# Patient Record
Sex: Male | Born: 2000 | Race: White | Hispanic: No | Marital: Single | Smoking: Never smoker
Health system: Southern US, Community
[De-identification: ages and names within clinical notes are randomized; demographics above are authoritative.]

## PROBLEM LIST (undated history)

## (undated) DIAGNOSIS — T7840XA Allergy, unspecified, initial encounter: Secondary | ICD-10-CM

## (undated) DIAGNOSIS — Z9889 Other specified postprocedural states: Secondary | ICD-10-CM

## (undated) DIAGNOSIS — J45909 Unspecified asthma, uncomplicated: Secondary | ICD-10-CM

## (undated) DIAGNOSIS — R112 Nausea with vomiting, unspecified: Secondary | ICD-10-CM

## (undated) HISTORY — DX: Unspecified asthma, uncomplicated: J45.909

## (undated) HISTORY — DX: Allergy, unspecified, initial encounter: T78.40XA

---
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
## 2021-02-08 ENCOUNTER — Encounter: Payer: Self-pay | Admitting: Family Medicine

## 2021-02-08 ENCOUNTER — Other Ambulatory Visit: Payer: Self-pay

## 2021-02-08 ENCOUNTER — Ambulatory Visit (INDEPENDENT_AMBULATORY_CARE_PROVIDER_SITE_OTHER): Payer: No Typology Code available for payment source | Admitting: Family Medicine

## 2021-02-08 VITALS — BP 134/80 | HR 102 | Temp 98.1°F | Ht 71.0 in | Wt 281.2 lb

## 2021-02-08 DIAGNOSIS — Z87438 Personal history of other diseases of male genital organs: Secondary | ICD-10-CM | POA: Diagnosis not present

## 2021-02-08 DIAGNOSIS — Z113 Encounter for screening for infections with a predominantly sexual mode of transmission: Secondary | ICD-10-CM

## 2021-02-08 NOTE — Progress Notes (Signed)
[facility] [address] [place] [address] Dept: [phone] Dept Fax: [phone]  Office Visit  Subjective:    Patient ID: Dustin Parks, male    DOB: 10/01/2001, 20 y.o..   MRN: [id number]  Chief Complaint  Patient presents with  . Acute Visit    Wants STD screening, wants sperm count, and to discuss feet care.      History of Present Illness:  Patient is in today requesting screening for STDs. He notes that he recently learned that a former sexual partner had not been faithful to him. Dustin Parks has not had any signs of a current STD, denying penile discharge, sores, or swollen glands. He does admit to unprotected intercourse wit this previous partner.  Dustin Parks notes that with his current partner, they are discussing having a baby. He has a concern about his own sperm count. He notes his father has a history of a low sperm count. 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 He notes he ws supposed to have seen a urologist at the time, but this never happened. He has no current children.  Dustin Parks also had some questions in regards to prevention of foot problems. He is working in [employer], which involves being on his feet much of the time. He currently has Birkenstock shoes. He finds the shoes to be too firm and pressing more than usual on the arch. He wonders about how to prevent later foot problems.  Past Medical History: Patient Active Problem List   Diagnosis Date Noted  . History of orchitis 02/08/2021   No past surgical history on file.  Family History  Problem Relation Age of Onset  . Healthy Mother   . Healthy Father   . Healthy Maternal Grandmother   . Healthy Paternal Grandfather    Outpatient Medications Prior to Visit  Medication Sig Dispense Refill  . Saccharomyces boulardii (PROBIOTIC) 250 MG CAPS Take by  mouth.    . cephALEXin (KEFLEX) 500 MG capsule Take 1 capsule (500 mg total) by mouth 3 (three) times daily. (Patient not taking: No sig reported) 21 capsule 0  . oseltamivir (TAMIFLU) 75 MG capsule Take 1 capsule (75 mg total) by mouth every 12 (twelve) hours. (Patient not taking: No sig reported) 10 capsule 0   No facility-administered medications prior to visit.   No Known Allergies    Objective:   Today's Vitals   02/08/21 1307  BP: 134/80  Pulse: (!) 102  Temp: 98.1 F (36.7 C)  TempSrc: Temporal  SpO2: 98%  Weight: 281 lb 3.2 oz (127.6 kg)  Height: 5\' 11"  (1.803 m)   Body mass index is 39.22 kg/m.   General: Well developed, well nourished. No acute distress. Psych: Alert and oriented. Normal mood and affect.  Health Maintenance Due  Topic Date Due  . Hepatitis C Screening  Never done  . COVID-19 Vaccine (1) Never done  . HPV VACCINES (1 - Male 2-dose series) Never done     Assessment & Plan:   1. Encounter for screening examination for sexually transmitted disease We will perform screening tests for STDs today. As he is in a more stable relationship and looking at having a child, I did not discuss further about condom use.  - Chlamydia/GC NAA, Confirmation - HCV Ab w Reflex to Quant PCR - Hepatitis B surface antigen - HIV Antibody (routine testing w rflx) - RPR  2. History of orchitis Although we would not typically perform a semen analysis in someone without a history of infertility, the remote history of orchitis raises the potential for this being an issue. Our clinic is not able to run a sperm analysis locally. I will refer him to urology for an assessment related to hsi past orchitis and possibly completed the semen analysis.  We did discuss potentially using off-the-chelf shoe inserts to see if this improves comfort. - Ambulatory referral to Urology  Loyola Mast, MD

## 2021-02-09 LAB — RPR: RPR Ser Ql: NONREACTIVE

## 2021-02-09 LAB — HCV INTERPRETATION

## 2021-02-09 LAB — HEPATITIS B SURFACE ANTIGEN: Hepatitis B Surface Ag: NONREACTIVE

## 2021-02-09 LAB — HCV AB W REFLEX TO QUANT PCR: HCV Ab: <0.1 s/co ratio (ref 0.0–0.9)

## 2021-02-09 LAB — HIV ANTIBODY (ROUTINE TESTING W REFLEX): HIV 1&2 Ab, 4th Generation: NONREACTIVE

## 2021-02-10 LAB — CHLAMYDIA/GC NAA, CONFIRMATION
Chlamydia trachomatis, NAA: NEGATIVE
Neisseria gonorrhoeae, NAA: NEGATIVE

## 2021-03-30 ENCOUNTER — Other Ambulatory Visit (HOSPITAL_COMMUNITY): Payer: Self-pay

## 2021-03-30 MED ORDER — CARESTART COVID-19 HOME TEST VI KIT
PACK | 0 refills | Status: DC
  Filled 2021-03-30: qty 4, 4d supply, fill #0

## 2021-05-26 ENCOUNTER — Other Ambulatory Visit (HOSPITAL_COMMUNITY): Payer: Self-pay

## 2021-05-26 MED ORDER — CARESTART COVID-19 HOME TEST VI KIT
PACK | 0 refills | Status: DC
  Filled 2021-05-26: qty 4, 4d supply, fill #0

## 2021-07-18 ENCOUNTER — Other Ambulatory Visit (HOSPITAL_COMMUNITY): Payer: Self-pay

## 2021-07-18 MED ORDER — CARESTART COVID-19 HOME TEST VI KIT
PACK | 0 refills | Status: DC
  Filled 2021-07-18: qty 4, 4d supply, fill #0

## 2021-08-11 ENCOUNTER — Encounter: Payer: Self-pay | Admitting: Family Medicine

## 2021-08-21 ENCOUNTER — Encounter: Payer: No Typology Code available for payment source | Admitting: Family Medicine

## 2021-09-26 ENCOUNTER — Other Ambulatory Visit: Payer: Self-pay

## 2021-09-26 ENCOUNTER — Encounter: Payer: Self-pay | Admitting: Family Medicine

## 2021-09-26 ENCOUNTER — Ambulatory Visit (INDEPENDENT_AMBULATORY_CARE_PROVIDER_SITE_OTHER): Payer: No Typology Code available for payment source

## 2021-09-26 ENCOUNTER — Ambulatory Visit (INDEPENDENT_AMBULATORY_CARE_PROVIDER_SITE_OTHER): Payer: No Typology Code available for payment source | Admitting: Family Medicine

## 2021-09-26 VITALS — BP 138/82 | HR 105 | Temp 97.8°F | Ht 71.0 in | Wt 304.0 lb

## 2021-09-26 DIAGNOSIS — F4320 Adjustment disorder, unspecified: Secondary | ICD-10-CM | POA: Diagnosis not present

## 2021-09-26 DIAGNOSIS — M542 Cervicalgia: Secondary | ICD-10-CM

## 2021-09-26 DIAGNOSIS — Z3009 Encounter for other general counseling and advice on contraception: Secondary | ICD-10-CM | POA: Diagnosis not present

## 2021-09-26 DIAGNOSIS — Z23 Encounter for immunization: Secondary | ICD-10-CM

## 2021-09-26 IMAGING — DX DG CERVICAL SPINE COMPLETE 4+V
5 series · 5 of 5 positions shown · non-contrast
Comparison: None.

CLINICAL DATA: Pain.  Ongoing neck pain.

EXAM:
CERVICAL SPINE - COMPLETE 4+ VIEW

[cervical spine ap]
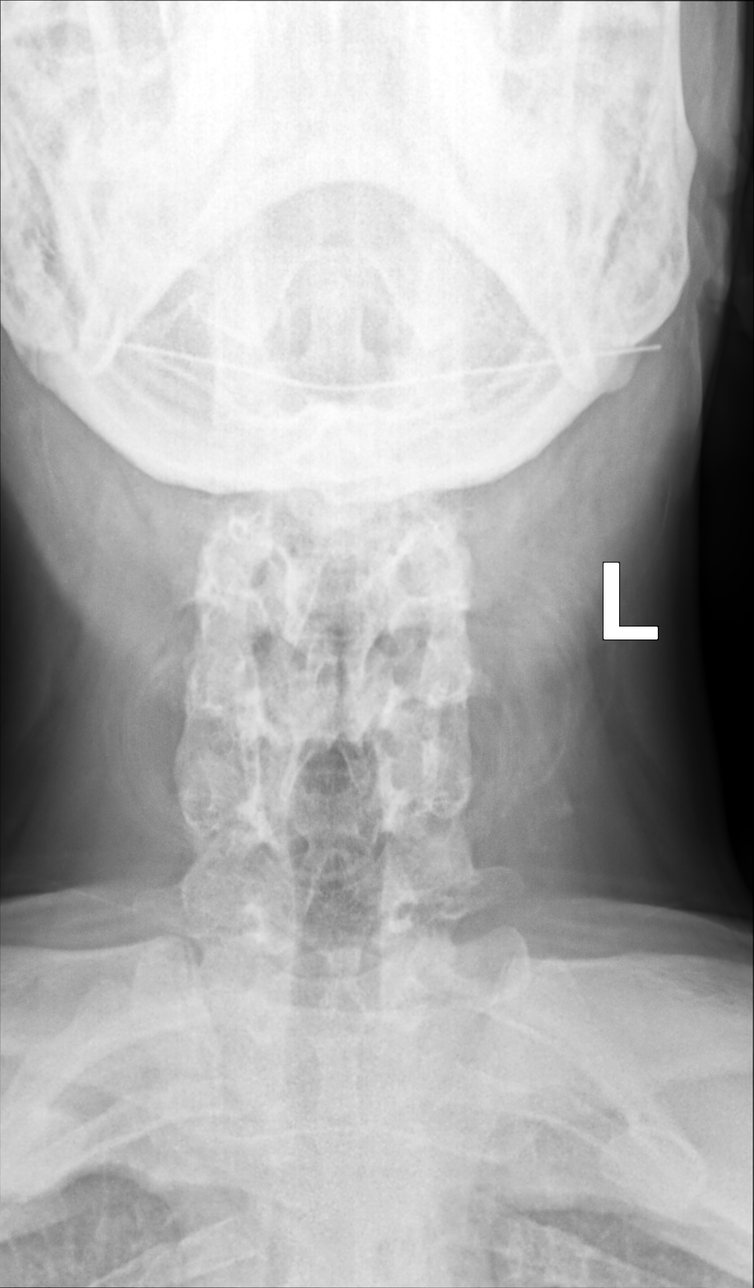

[cervical spine oblique (1 of 2)]
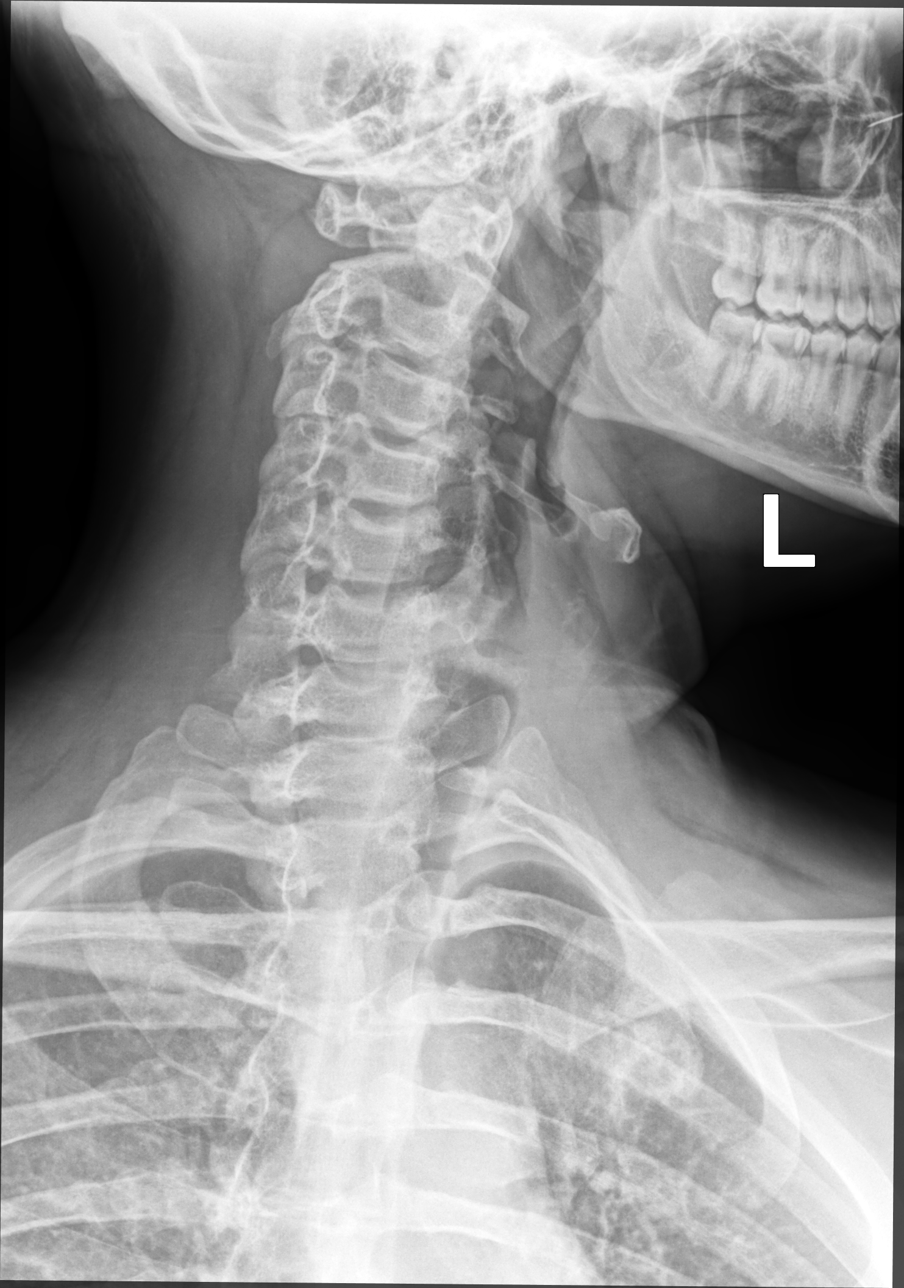

[cervical spine oblique (2 of 2)]
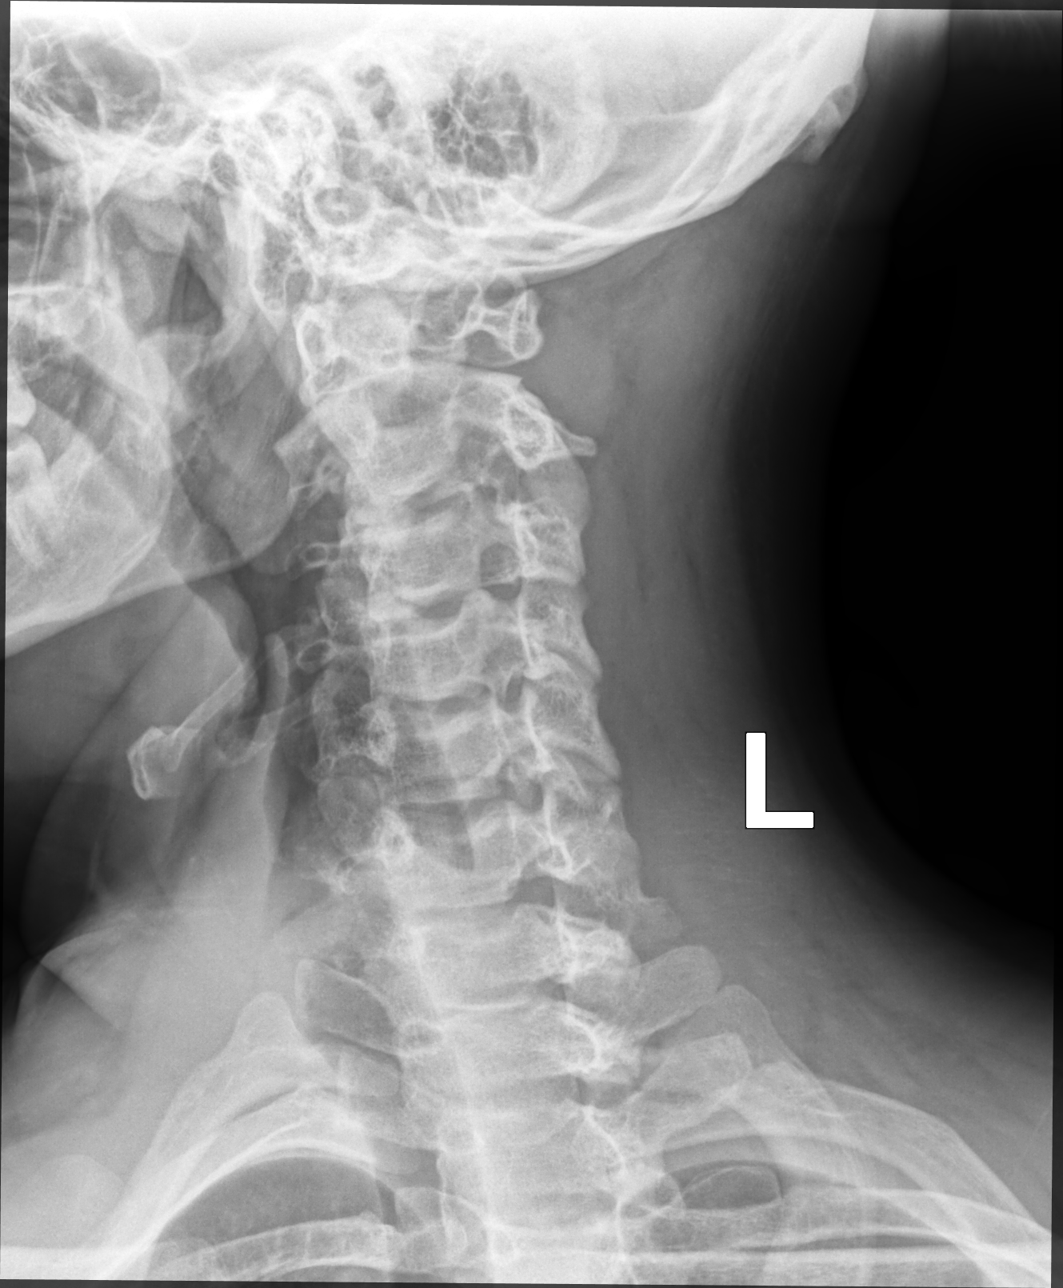

[cervical spine lat]
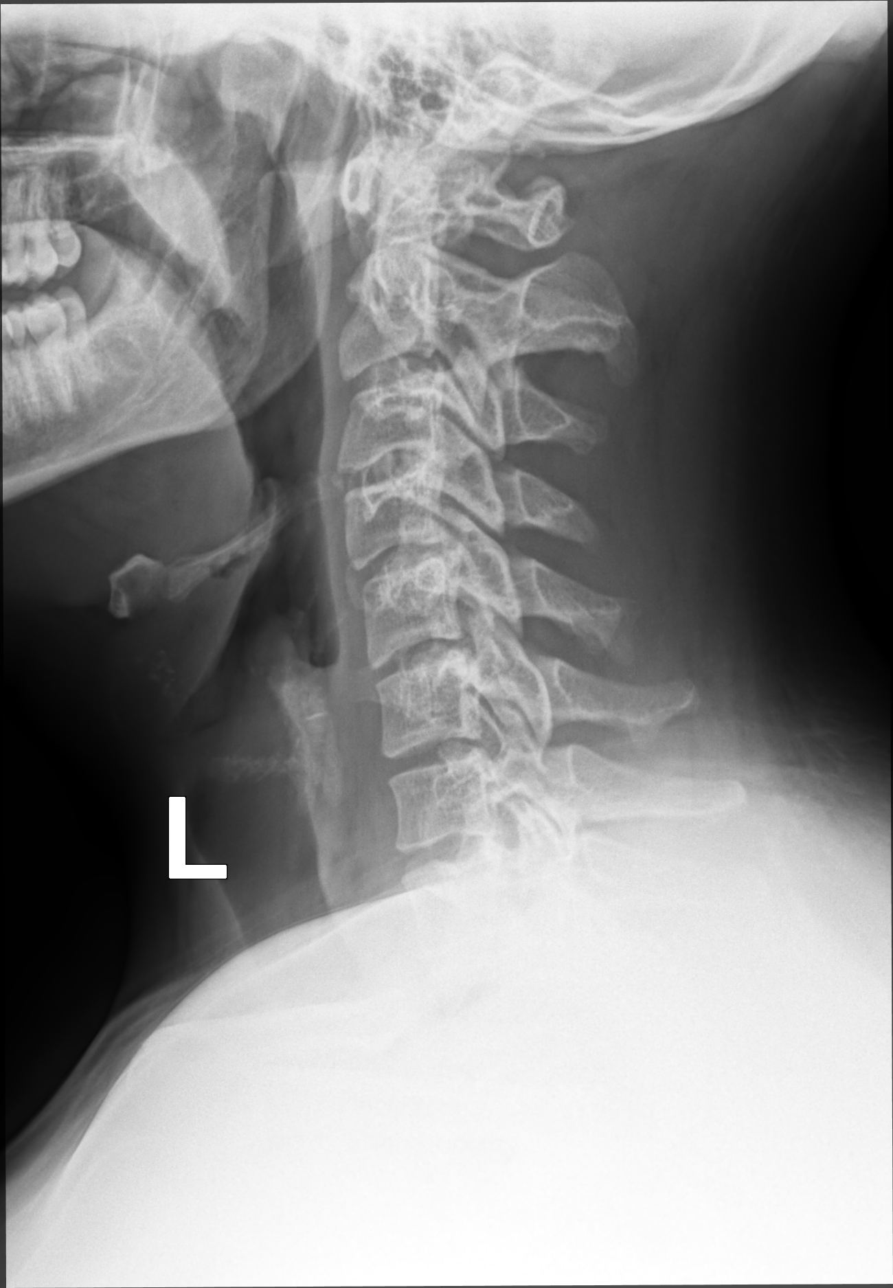

[cervical spine open mouth ap]
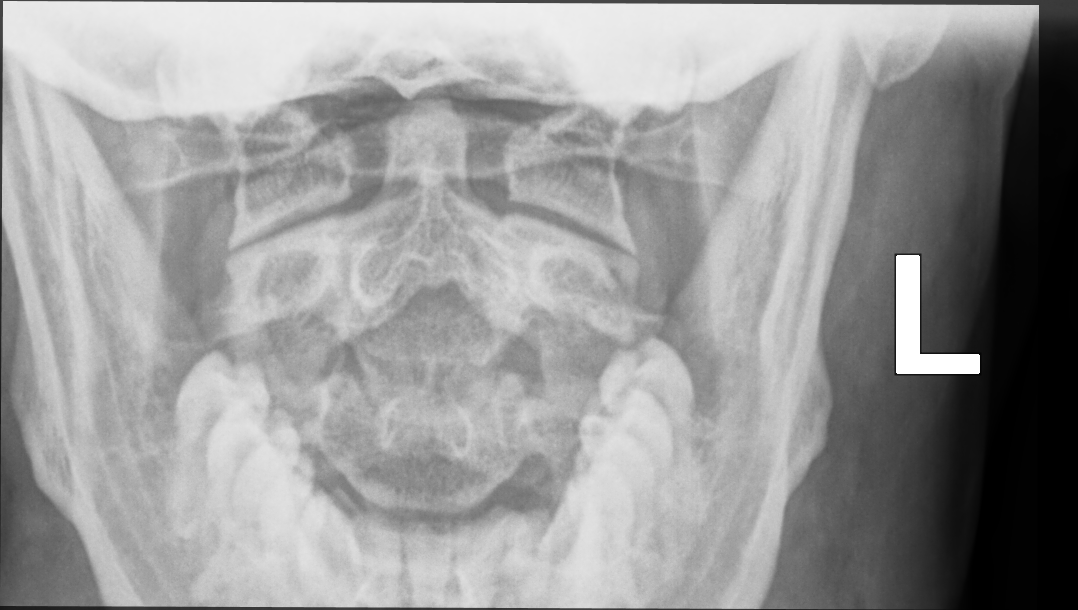

[5 of 5 positions shown; findings below may reference images not displayed]

FINDINGS: Cervical spine alignment is maintained. Vertebral body heights and
intervertebral disc spaces are preserved. The dens is intact.
Posterior elements appear well-aligned. There is no evidence of
fracture or focal bone abnormality. Positioning limits assessment of
neural foramina. No prevertebral soft tissue edema.
IMPRESSION: Negative radiographs of the cervical spine.

## 2021-09-26 NOTE — Progress Notes (Signed)
Established Patient Office Visit  Subjective:  Patient ID: Dustin Parks, male    DOB: 21-Apr-2001  Age: 20 y.o. MRN: [id number]  CC:  Chief Complaint  Patient presents with   Annual Exam    CPE, concerns about back pains. Would like referral to chiropractor, vasectomy, nose bleeds more frequent.       HPI Dustin Parks presents for evaluation for neck and back pain.  Neck seems to be bothering him the most.  Pain in his neck and lower back are nonradiating.  It is intermittent.  Denies injury history.  Denies muscle spasms.  He works as a Biomedical scientist.  He is tall and he notes that his workstation is at an uncomfortable height for him.  He wonders if he needs a new pillow.  He bought an expensive pillow and it does not seem to be helping.  He would like to see a chiropractor.  He requests again referral for vasectomy.  Past Medical History:  Diagnosis Date   Asthma    out grown now     No past surgical history on file.  Family History  Problem Relation Age of Onset   Healthy Mother    Healthy Father    Healthy Maternal Grandmother    Healthy Paternal Grandfather     Social History   Socioeconomic History   Marital status: Single    Spouse name: Not on file   Number of children: Not on file   Years of education: Not on file   Highest education level: Not on file  Occupational History   Not on file  Tobacco Use   Smoking status: Never   Smokeless tobacco: Never  Vaping Use   Vaping Use: Never used  Substance and Sexual Activity   Alcohol use: Not Currently    Comment: rare   Drug use: Yes    Types: Marijuana    Comment: social   Sexual activity: Yes  Other Topics Concern   Not on file  Social History Narrative   Not on file   Social Determinants of Health   Financial Resource Strain: Not on file  Food Insecurity: Not on file  Transportation Needs: Not on file  Physical Activity: Not on file  Stress: Not on file  Social Connections: Not on file   Intimate Partner Violence: Not on file    Outpatient Medications Prior to Visit  Medication Sig Dispense Refill   COVID-19 At Home Antigen Test ([facility] COVID-19 HOME TEST) KIT Use as directed 4 each 0   Saccharomyces boulardii (PROBIOTIC) 250 MG CAPS Take by mouth.     No facility-administered medications prior to visit.    No Known Allergies  ROS Review of Systems  Constitutional:  Negative for chills, diaphoresis, fatigue, fever and unexpected weight change.  HENT: Negative.    Eyes:  Negative for photophobia and visual disturbance.  Respiratory: Negative.    Cardiovascular: Negative.   Gastrointestinal: Negative.   Genitourinary: Negative.   Musculoskeletal:  Positive for back pain, neck pain and neck stiffness.  Neurological:  Negative for weakness and numbness.  Depression screen [facility] 2/9 09/26/2021 08/18/2020 08/18/2020  Decreased Interest 0 0 0  Down, Depressed, Hopeless 0 1 0  PHQ - 2 Score 0 1 0  Altered sleeping - 1 -  Tired, decreased energy - 2 -  Change in appetite - 0 -  Feeling bad or failure about yourself  - 1 -  Trouble concentrating - 1 -  Moving slowly  or fidgety/restless - 0 -  Suicidal thoughts - 0 -  PHQ-9 Score - 6 -  Difficult doing work/chores - Somewhat difficult -       Objective:    Physical Exam Vitals and nursing note reviewed.  Constitutional:      Appearance: Normal appearance.  HENT:     Head: Normocephalic and atraumatic.  Eyes:     General:        Right eye: No discharge.        Left eye: No discharge.     Extraocular Movements: Extraocular movements intact.     Conjunctiva/sclera: Conjunctivae normal.  Pulmonary:     Effort: Pulmonary effort is normal.  Musculoskeletal:     Cervical back: No spasms, tenderness or bony tenderness. No pain with movement. Normal range of motion.     Lumbar back: No tenderness or bony tenderness. Normal range of motion.  Skin:    General: Skin is warm and dry.  Neurological:      Mental Status: He is alert and oriented to person, place, and time.     Cranial Nerves: No dysarthria or facial asymmetry.     Motor: No weakness.  Psychiatric:        Mood and Affect: Mood normal.        Behavior: Behavior normal.    BP 138/82 (BP Location: Right Arm, Patient Position: Sitting, Cuff Size: Large)   Pulse (!) 105   Temp 97.8 F (36.6 C) (Temporal)   Ht 5' 11"  (1.803 m)   Wt (!) 304 lb (137.9 kg)   SpO2 98%   BMI 42.40 kg/m  Wt Readings from Last 3 Encounters:  09/26/21 (!) 304 lb (137.9 kg) (>99 %, Z= 3.10)*  02/08/21 281 lb 3.2 oz (127.6 kg) (>99 %, Z= 2.84)*  08/18/20 282 lb 12.8 oz (128.3 kg) (>99 %, Z= 2.87)*   * Growth percentiles are based on CDC (Boys, 2-20 Years) data.     Health Maintenance Due  Topic Date Due   HPV VACCINES (1 - Male 2-dose series) Never done   INFLUENZA VACCINE  06/05/2021       Topic Date Due   HPV VACCINES (1 - Male 2-dose series) Never done    No results found for: TSH Lab Results  Component Value Date   WBC 8.1 08/18/2020   HGB 15.8 08/18/2020   HCT 46.9 08/18/2020   MCV 81.5 08/18/2020   PLT 192.0 08/18/2020   Lab Results  Component Value Date   NA 137 08/18/2020   K 3.9 08/18/2020   CO2 25 08/18/2020   GLUCOSE 105 (H) 08/18/2020   BUN 14 08/18/2020   CREATININE 0.75 08/18/2020   BILITOT 0.7 08/18/2020   ALKPHOS 42 (L) 08/18/2020   AST 28 08/18/2020   ALT 30 08/18/2020   PROT 7.9 08/18/2020   ALBUMIN 4.6 08/18/2020   CALCIUM 10.1 08/18/2020   GFR 133.76 08/18/2020   Lab Results  Component Value Date   CHOL 168 08/18/2020   Lab Results  Component Value Date   HDL 44.30 08/18/2020   No results found for: [facility] Lab Results  Component Value Date   TRIG 242.0 (H) 08/18/2020   Lab Results  Component Value Date   CHOLHDL 4 08/18/2020   No results found for: HGBA1C    Assessment & Plan:   Problem List Items Addressed This Visit       Other   Adjustment disorder   Relevant Orders    Ambulatory referral  to Psychology   Encounter for vasectomy counseling   Relevant Orders   Ambulatory referral to Urology   Neck pain - Primary   Relevant Orders   DG Cervical Spine Complete   Ambulatory referral to Sports Medicine    No orders of the defined types were placed in this encounter.   Follow-up: Return in about 6 months (around 03/26/2022).  Patient was given information on surgical sterilization.  He is aware that it is considered to be a permanent procedure.  He has decided that he wants this done.  I am referring him to urology. I am also referring him to psychology.  We talked about changes in his workspace and proper neck and lower back posture.  Advised him to consider raising his workstation to accommodate his height.  He was given information on neck exercises.  Libby Maw, MD

## 2021-10-04 NOTE — Progress Notes (Signed)
[facility] [address] [place] [address] Phone: [phone] Subjective:   Bruce Donath, am serving as a scribe for Dr. Antoine Primas. This visit occurred during the SARS-CoV-2 public health emergency.  Safety protocols were in place, including screening questions prior to the visit, additional usage of staff PPE, and extensive cleaning of exam room while observing appropriate contact time as indicated for disinfecting solutions.    I'm seeing this patient by the request  of:  Mliss Sax, MD  CC: Neck pain  FTD:[id number]  Dustin Parks is a 20 y.o. male coming in with complaint of neck pain. Patient states that he has had pain for 6 months in neck. Feels like his pillow is the issue. Patient is able to self manipulate neck which helps alleviate his pain. Feels work contributes to issues as he works as Investment banker, operational at [employer]. Not able to raise work station but has been using a wider stance to lower himself to countertop.   Cervical Xray 09/26/2021 IMPRESSION: Negative radiographs of the cervical spine.       Past Medical History:  Diagnosis Date   Asthma    out grown now    No past surgical history on file. Social History   Socioeconomic History   Marital status: Single    Spouse name: Not on file   Number of children: Not on file   Years of education: Not on file   Highest education level: Not on file  Occupational History   Not on file  Tobacco Use   Smoking status: Never   Smokeless tobacco: Never  Vaping Use   Vaping Use: Never used  Substance and Sexual Activity   Alcohol use: Not Currently    Comment: rare   Drug use: Yes    Types: Marijuana    Comment: social   Sexual activity: Yes  Other Topics Concern   Not on file  Social History Narrative   Not on file   Social Determinants of Health   Financial Resource Strain: Not on file  Food Insecurity: Not on file  Transportation Needs:  Not on file  Physical Activity: Not on file  Stress: Not on file  Social Connections: Not on file   No Known Allergies Family History  Problem Relation Age of Onset   Healthy Mother    Healthy Father    Healthy Maternal Grandmother    Healthy Paternal Grandfather    No current outpatient medications on file.   Reviewed prior external information including notes and imaging from  primary care provider As well as notes that were available from care everywhere and other healthcare systems.  Past medical history, social, surgical and family history all reviewed in electronic medical record.  No pertanent information unless stated regarding to the chief complaint.   Review of Systems:  No headache, visual changes, nausea, vomiting, diarrhea, constipation, dizziness, abdominal pain, skin rash, fevers, chills, night sweats, weight loss, swollen lymph nodes, body aches, joint swelling, chest pain, shortness of breath, mood changes. POSITIVE muscle aches  Objective  Blood pressure 112/86, pulse 83, height 5\' 11"  (1.803 m), weight (!) 303 lb (137.4 kg), SpO2 98 %.   General: No apparent distress alert and oriented x3 mood and affect normal, dressed appropriately.  HEENT: Pupils equal, extraocular movements intact  Respiratory: Patient's speak in full sentences and does not appear short of breath  Cardiovascular: No lower extremity edema, non tender, no erythema  Gait normal  with good balance and coordination.  MSK:   Neck exam shows patient does actually have more of a hypermobility noted.  Patient was tender to palpation noted in the paraspinal musculature of the cervical spine on the right greater than left.  Tightness also noted in the right scapular region.  5 out of 5 strength of the upper extremities otherwise.  Osteopathic findings C4 flexed rotated and side bent right T3 extended rotated and side bent right inhaled third rib T9 extended rotated and side bent left      Impression and Recommendations:     The above documentation has been reviewed and is accurate and complete Judi Saa, DO

## 2021-10-05 ENCOUNTER — Ambulatory Visit (INDEPENDENT_AMBULATORY_CARE_PROVIDER_SITE_OTHER): Payer: No Typology Code available for payment source | Admitting: Family Medicine

## 2021-10-05 ENCOUNTER — Encounter: Payer: Self-pay | Admitting: Family Medicine

## 2021-10-05 ENCOUNTER — Other Ambulatory Visit: Payer: Self-pay

## 2021-10-05 VITALS — BP 112/86 | HR 83 | Ht 71.0 in | Wt 303.0 lb

## 2021-10-05 DIAGNOSIS — M9908 Segmental and somatic dysfunction of rib cage: Secondary | ICD-10-CM | POA: Diagnosis not present

## 2021-10-05 DIAGNOSIS — M542 Cervicalgia: Secondary | ICD-10-CM | POA: Diagnosis not present

## 2021-10-05 DIAGNOSIS — M9902 Segmental and somatic dysfunction of thoracic region: Secondary | ICD-10-CM

## 2021-10-05 DIAGNOSIS — M9901 Segmental and somatic dysfunction of cervical region: Secondary | ICD-10-CM

## 2021-10-05 NOTE — Assessment & Plan Note (Signed)
   Decision today to treat with OMT was based on Physical Exam  After verbal consent patient was treated with HVLA, ME, FPR techniques in cervical, thoracic, rib, areas, all areas are chronic   Patient tolerated the procedure well with improvement in symptoms  Patient given exercises, stretches and lifestyle modifications  See medications in patient instructions if given  Patient will follow up in 6-8 weeks 

## 2021-10-05 NOTE — Patient Instructions (Signed)
Vit D 2000IU  COOP pillow See me in 6-8 weeks

## 2021-10-05 NOTE — Assessment & Plan Note (Signed)
Patient does have hypermobility noted.  Patient also has poor posture patient is to work on this and given home exercises.  X-rays of the neck are unremarkable.  Patient given suggestions of different pillows that I think will be beneficial as well.  Optimistic patient will do well with conservative therapy.  Follow-up again in 6 to 8 weeks.  Responded well to manipulation.

## 2021-10-09 ENCOUNTER — Encounter: Payer: Self-pay | Admitting: Family Medicine

## 2021-10-09 DIAGNOSIS — S8990XS Unspecified injury of unspecified lower leg, sequela: Secondary | ICD-10-CM

## 2021-10-10 ENCOUNTER — Other Ambulatory Visit (HOSPITAL_BASED_OUTPATIENT_CLINIC_OR_DEPARTMENT_OTHER): Payer: Self-pay | Admitting: Orthopaedic Surgery

## 2021-10-10 ENCOUNTER — Other Ambulatory Visit: Payer: Self-pay

## 2021-10-10 ENCOUNTER — Ambulatory Visit (INDEPENDENT_AMBULATORY_CARE_PROVIDER_SITE_OTHER): Payer: No Typology Code available for payment source | Admitting: Orthopaedic Surgery

## 2021-10-10 ENCOUNTER — Ambulatory Visit (HOSPITAL_BASED_OUTPATIENT_CLINIC_OR_DEPARTMENT_OTHER)
Admission: RE | Admit: 2021-10-10 | Discharge: 2021-10-10 | Disposition: A | Discharge status: Home / Self Care | Payer: No Typology Code available for payment source | Source: Ambulatory Visit | Attending: Orthopaedic Surgery | Admitting: Orthopaedic Surgery

## 2021-10-10 DIAGNOSIS — M25572 Pain in left ankle and joints of left foot: Secondary | ICD-10-CM | POA: Insufficient documentation

## 2021-10-10 DIAGNOSIS — M25562 Pain in left knee: Secondary | ICD-10-CM | POA: Insufficient documentation

## 2021-10-10 DIAGNOSIS — M2392 Unspecified internal derangement of left knee: Secondary | ICD-10-CM | POA: Diagnosis not present

## 2021-10-10 IMAGING — DX DG KNEE COMPLETE 4+V*L*
4 series · 4 of 4 positions shown · non-contrast
Comparison: No prior.

CLINICAL DATA: Fall.  Left leg pain.

EXAM:
LEFT KNEE - COMPLETE 4+ VIEW

[knee ap]
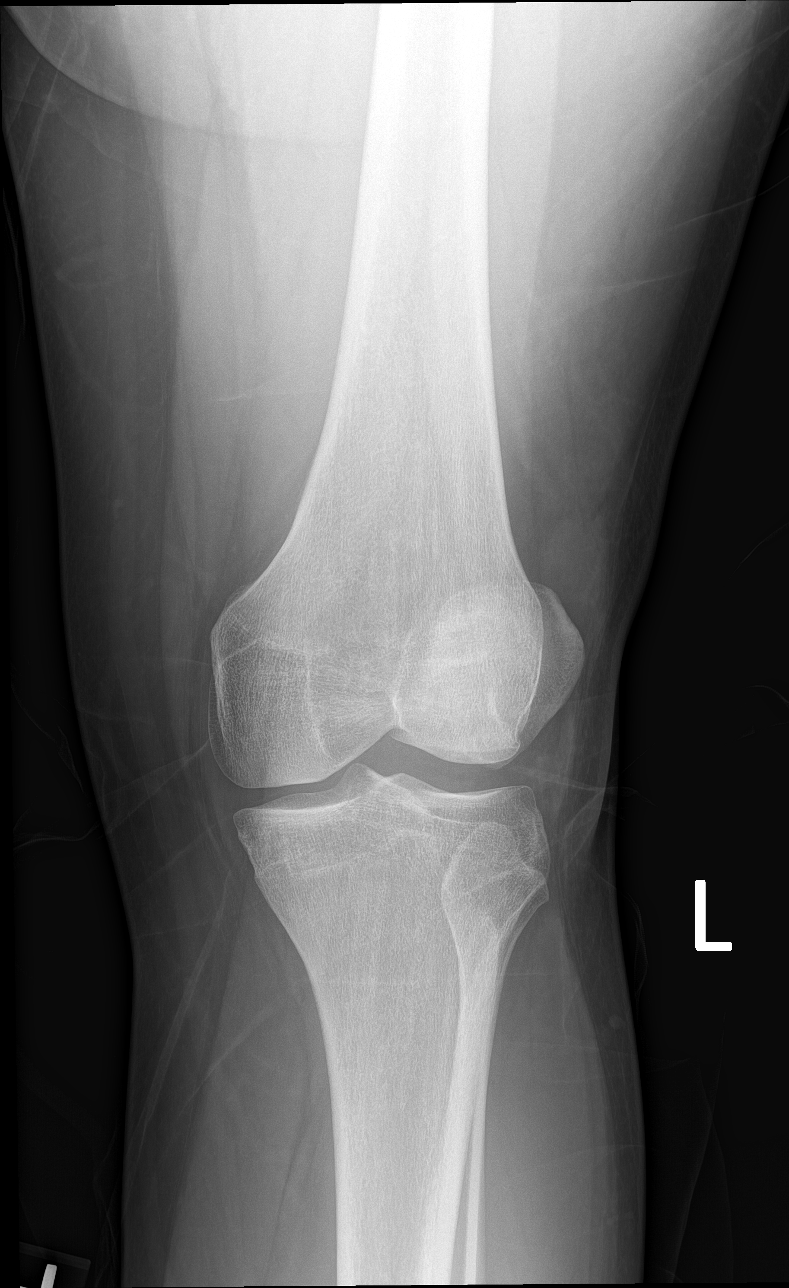

[knee obl (1 of 2)]
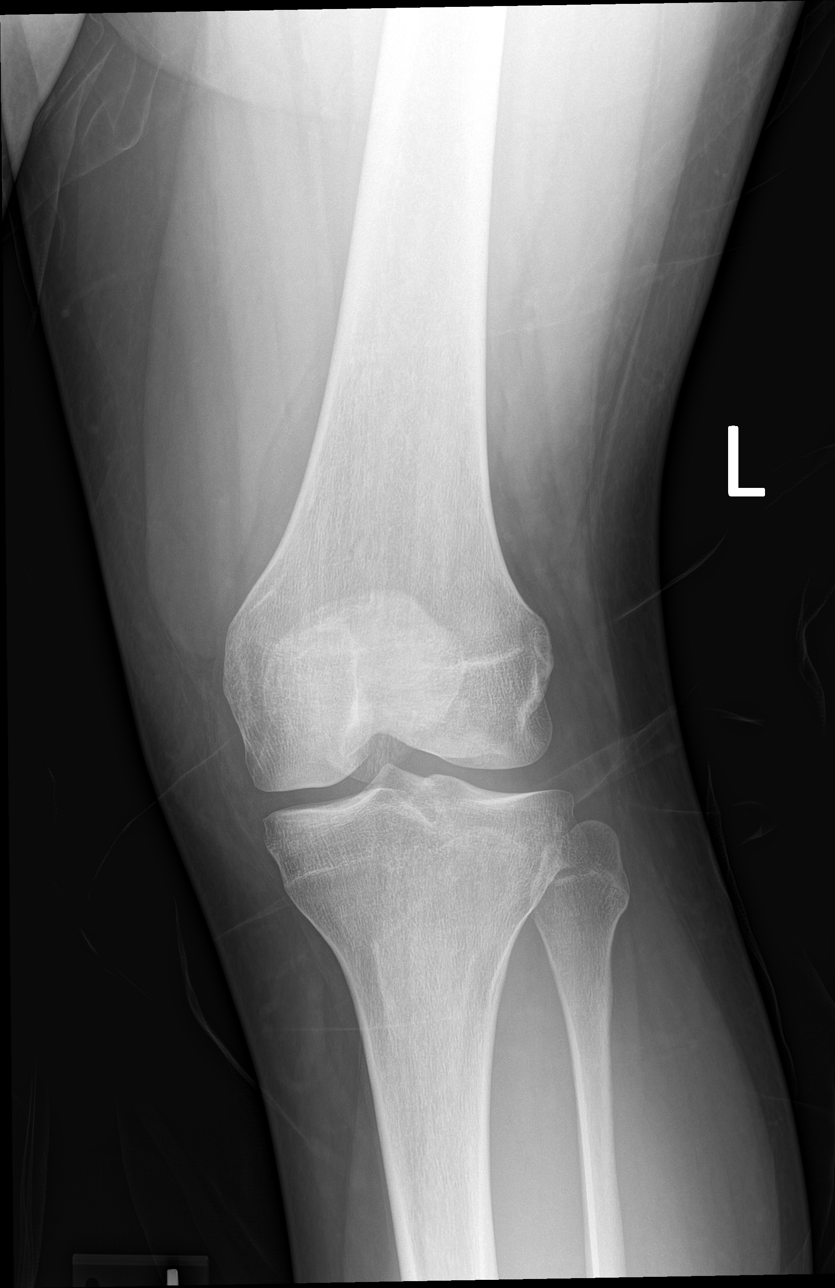

[knee obl (2 of 2)]
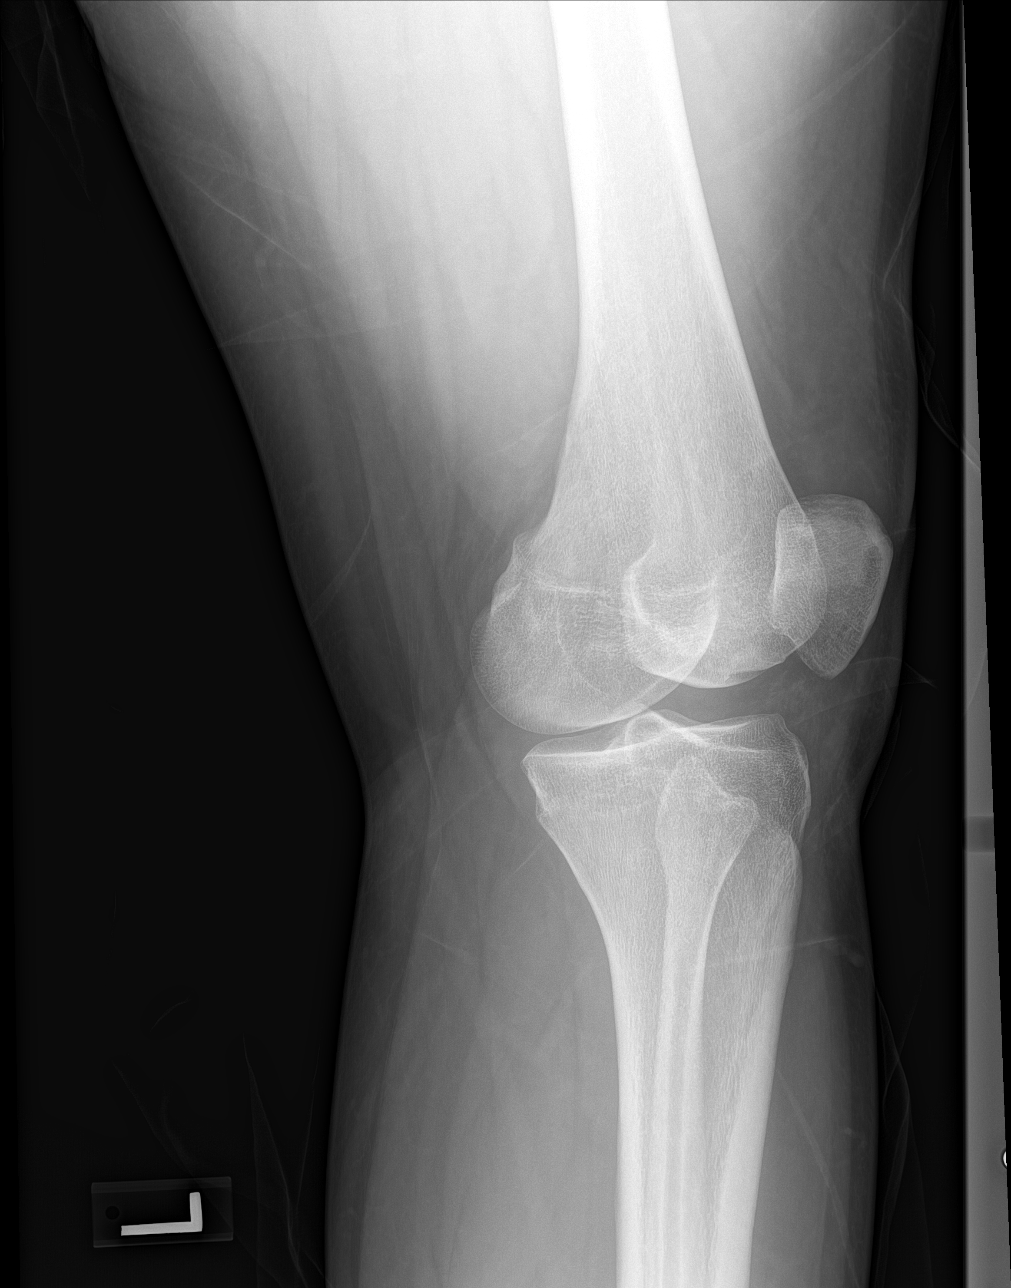

[knee lat]
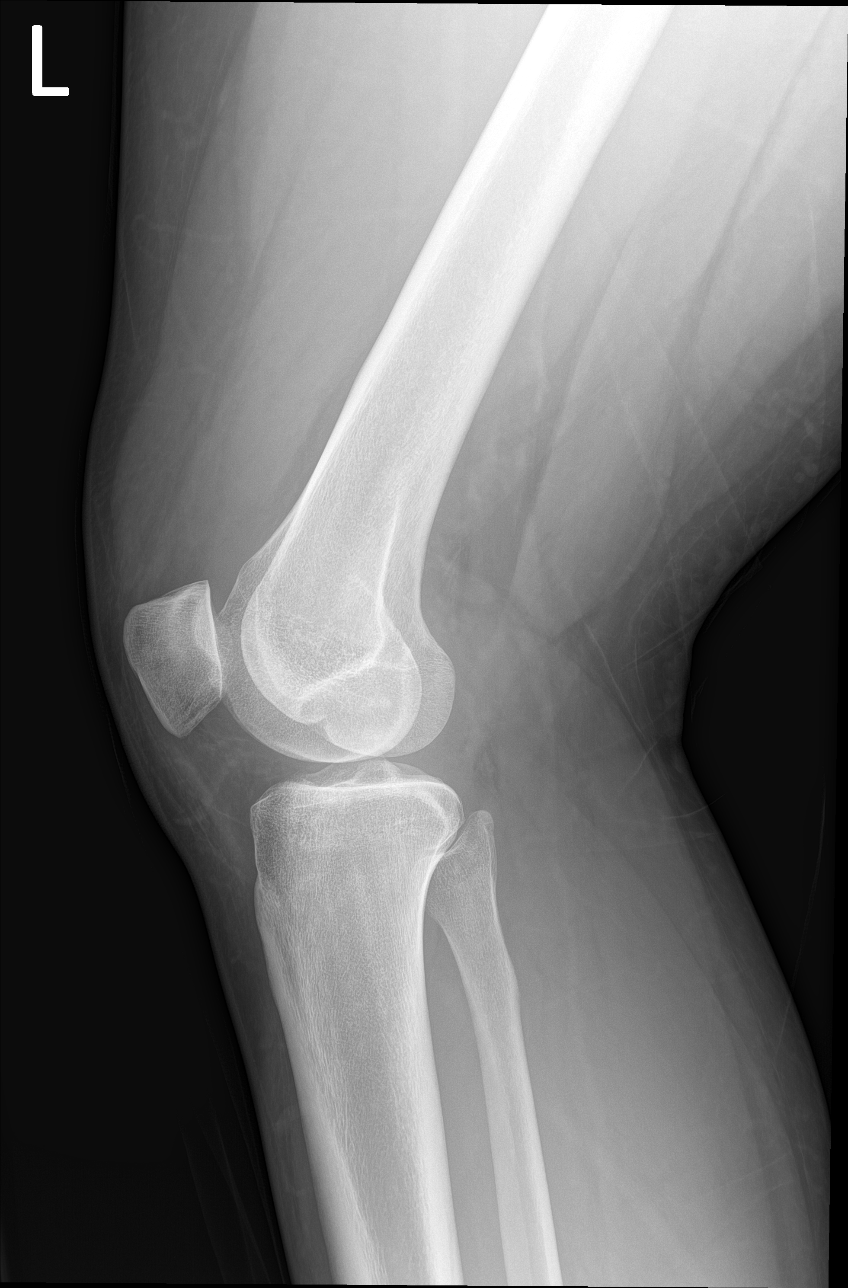

[4 of 4 positions shown; findings below may reference images not displayed]

FINDINGS: Left knee joint effusion cannot be excluded. No acute bony
abnormality identified. No evidence of fracture or dislocation.
IMPRESSION: Left knee joint effusion cannot be excluded. No acute bony
abnormality identified.

## 2021-10-10 NOTE — Progress Notes (Signed)
Chief Complaint: left knee     History of Present Illness:   Dustin Parks is a 20 y.o. male with left knee pain after a fall at the [place] [employer] Christmas party.  He states that the knee initially buckled with immediate swelling and difficulty placing weight on it.  He has been using crutches.  He has been taking ibuprofen and Tylenol.  He endorses pain about the lateral aspect of the knee as well as instability.  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    Surgical History:   None  PMH/PSH/Family History/Social History/Meds/Allergies:    Past Medical History:  Diagnosis Date   Asthma    out grown now    No past surgical history on file. Social History   Socioeconomic History   Marital status: Single    Spouse name: Not on file   Number of children: Not on file   Years of education: Not on file   Highest education level: Not on file  Occupational History   Not on file  Tobacco Use   Smoking status: Never   Smokeless tobacco: Never  Vaping Use   Vaping Use: Never used  Substance and Sexual Activity   Alcohol use: Not Currently    Comment: rare   Drug use: Yes    Types: Marijuana    Comment: social   Sexual activity: Yes  Other Topics Concern   Not on file  Social History Narrative   Not on file   Social Determinants of Health   Financial Resource Strain: Not on file  Food Insecurity: Not on file  Transportation Needs: Not on file  Physical Activity: Not on file  Stress: Not on file  Social Connections: Not on file   Family History  Problem Relation Age of Onset   Healthy Mother    Healthy Father    Healthy Maternal Grandmother    Healthy Paternal Grandfather    No Known Allergies No current outpatient medications on file.   No current facility-administered medications for this visit.   No results found.  Review of Systems:   A ROS was performed including pertinent  positives and negatives as documented in the HPI.  Physical Exam :   Constitutional: NAD and appears stated age Neurological: Alert and oriented Psych: Appropriate affect and cooperative There were no vitals taken for this visit.   Comprehensive Musculoskeletal Exam:     Musculoskeletal Exam  Gait Normal  Alignment Normal   Right Left  Inspection Normal Normal  Palpation    Tenderness None Lateral joint  Crepitus None None  Effusion None Significant  Range of Motion    Extension 0 0  Flexion 135 90  Strength    Extension 5/5 5/5  Flexion 5/5 5/5  Ligament Exam     Generalized Laxity No No  Lachman Negative Positive, with endpoint  Pivot Shift Negative Guarded  Anterior Drawer Negative Positive  Valgus at 0 Negative Painful not opening  Valgus at 20 Negative Painful  Varus at 0 0 0  Varus at 20   0 0  Posterior Drawer at 90 0 0  Vascular/Lymphatic Exam    Edema None None  Venous Stasis Changes No No  Distal Circulation Normal Normal  Neurologic    Light  Touch Sensation Intact Intact  Special Tests:      Imaging:   Xray (4 views left knee): Normal    I personally reviewed and interpreted the radiographs.   Assessment:   20 year old male with left knee pain and swelling after a valgus type injury.  He has not been able to put weight on the leg.  His exam does have some laxity and Lachman.  I would like to send him for an urgent MRI to rule out a ACL tear as well as a lateral meniscal injury given his lateral joint line tenderness.  He will be out of work for 2 weeks  Plan :    -Plan for left knee MRI urgently to rule out ACL injury as well as lateral meniscal injury  I believe that advance imaging in the form of an MRI is indicated for the following reasons: -Xrays images were obtained and not diagnostic -The patient has failed treatment modalities including rest, ice, NSAIDs -The following worrisome symptoms are present on history and exam: Positive  Lachman in the setting of an acute injury and inability to bear weight   I personally saw and evaluated the patient, and participated in the management and treatment plan.  Huel Cote, MD Attending Physician, Orthopedic Surgery  This document was dictated using Dragon voice recognition software. A reasonable attempt at proof reading has been made to minimize errors.

## 2021-10-11 ENCOUNTER — Ambulatory Visit
Admission: RE | Admit: 2021-10-11 | Discharge: 2021-10-11 | Disposition: A | Discharge status: Home / Self Care | Payer: No Typology Code available for payment source | Source: Ambulatory Visit | Attending: Orthopaedic Surgery | Admitting: Orthopaedic Surgery

## 2021-10-11 DIAGNOSIS — M25562 Pain in left knee: Secondary | ICD-10-CM

## 2021-10-11 IMAGING — MR MR KNEE*L* W/O CM
8 series · 40 of 40 positions shown · non-contrast
Comparison: Left knee x-rays dated [DATE].

CLINICAL DATA: Continued left knee pain since fall at work 2 months
ago.

EXAM:
MRI OF THE LEFT KNEE WITHOUT CONTRAST
TECHNIQUE: Multiplanar, multisequence MR imaging of the knee was performed. No
intravenous contrast was administered.

[Series 5: T2 fat-sat · oblique · left · 4.0mm · 0.50mm/px · 5 of 33 slices shown (1 of 4)]
[im 1/33]
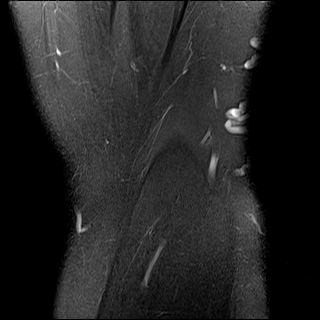
[im 9/33]
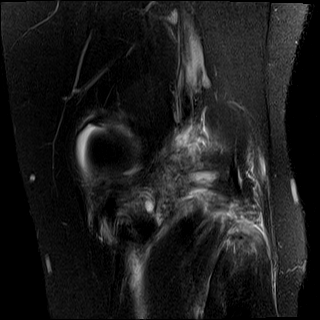
[im 17/33]
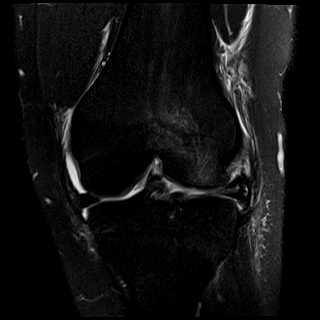
[im 25/33]
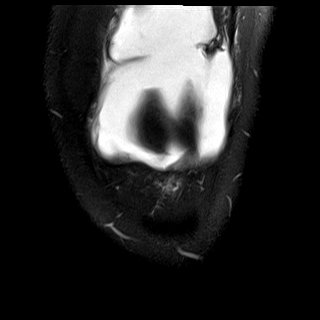
[im 33/33]
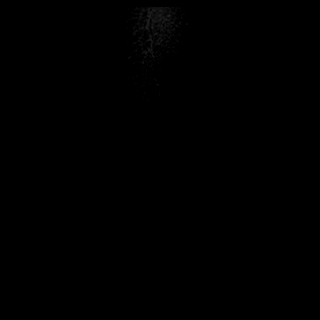

[Series 6: T1 · oblique · left · 4.0mm · 0.50mm/px · 5 of 33 slices shown]
[im 1/33]
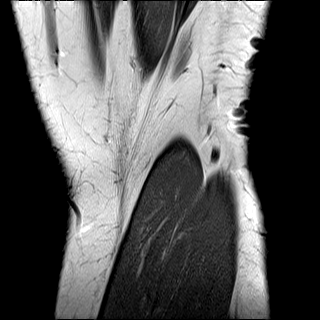
[im 9/33]
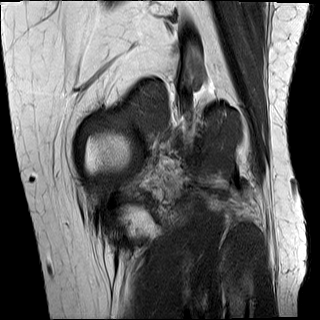
[im 17/33]
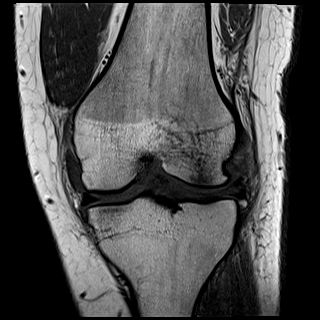
[im 25/33]
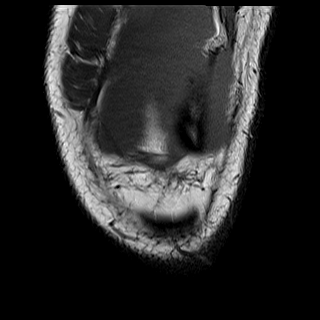
[im 33/33]
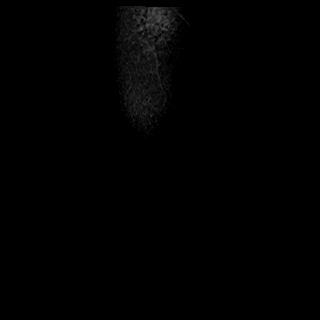

[Series 8: PD fat-sat · oblique · left · 3.3mm · 0.62mm/px · 6 of 35 slices shown (1 of 2)]
[im 1/35]
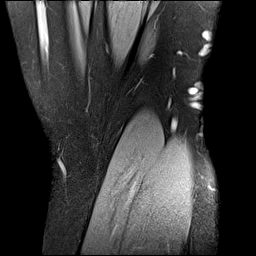
[im 7/35]
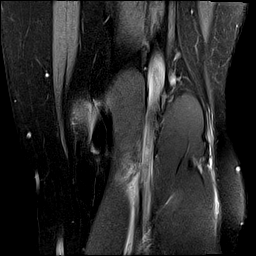
[im 14/35]
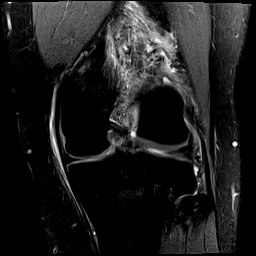
[im 21/35]
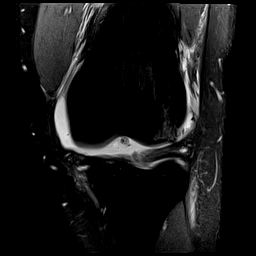
[im 28/35]
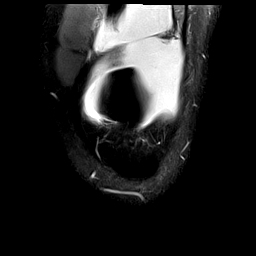
[im 35/35]
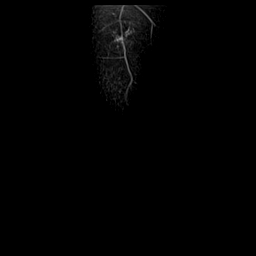

[Series 9: PD fat-sat · oblique · left · 3.3mm · 0.50mm/px · 5 of 30 slices shown (2 of 2)]
[im 1/30]
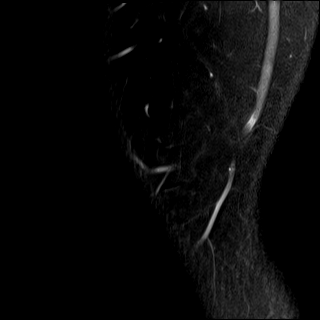
[im 8/30]
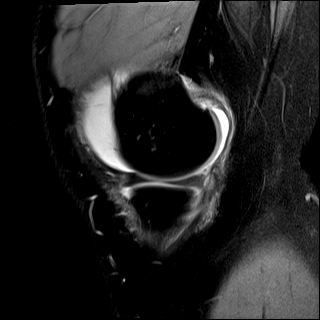
[im 15/30]
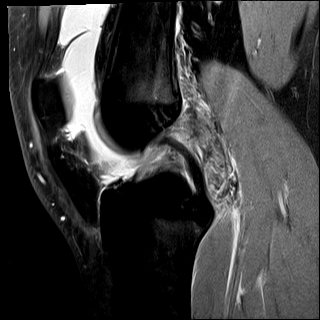
[im 22/30]
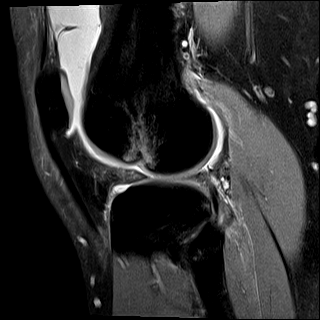
[im 30/30]
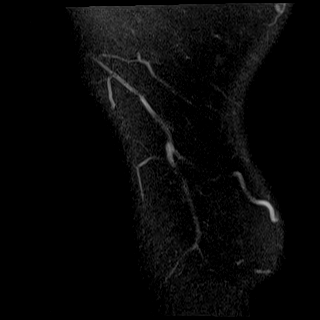

[Series 10: T2 fat-sat · oblique · left · 3.3mm · 0.50mm/px · 5 of 30 slices shown (2 of 4)]
[im 1/30]
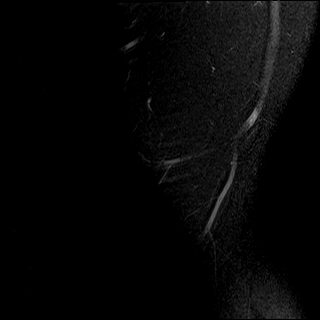
[im 8/30]
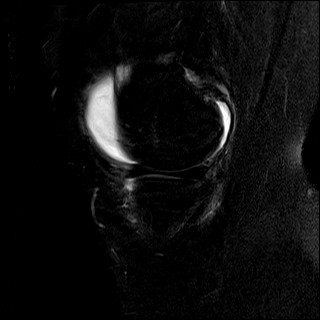
[im 15/30]
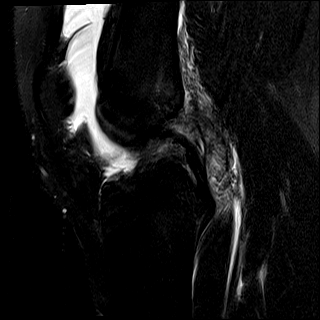
[im 22/30]
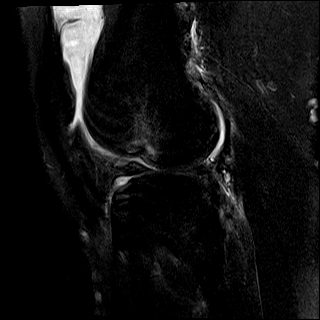
[im 30/30]
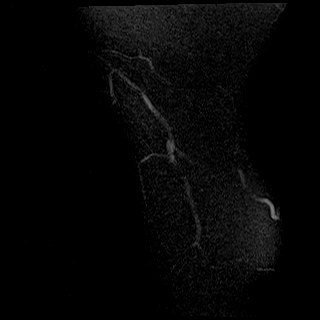

[Series 11: PD · oblique · left · 2.0mm · 0.59mm/px · 3 of 16 slices shown]
[im 1/16]
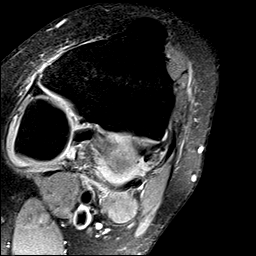
[im 8/16]
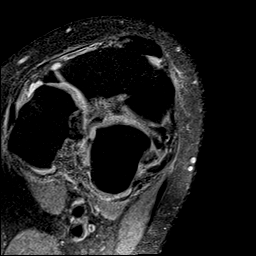
[im 16/16]
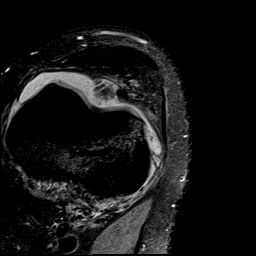

[Series 12: T2 fat-sat · oblique · left · 3.3mm · 0.62mm/px · 5 of 30 slices shown (3 of 4)]
[im 1/30]
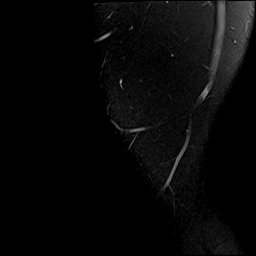
[im 8/30]
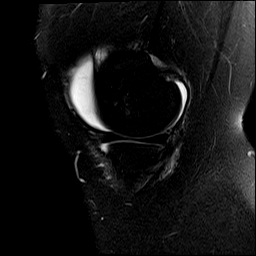
[im 15/30]
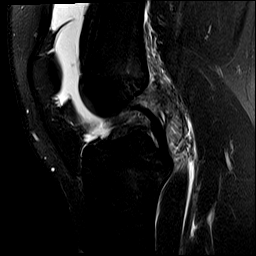
[im 22/30]
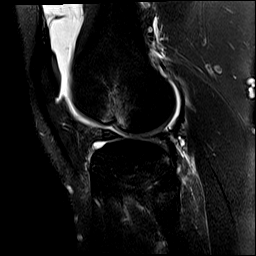
[im 30/30]
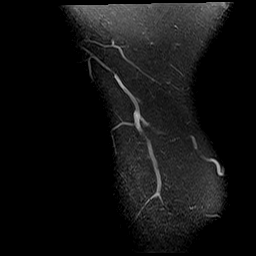

[Series 100: T2 fat-sat · axial · left · 4.0mm · 0.50mm/px · z∈[-114,+39]mm · 6 of 36 slices shown (4 of 4)]
[im 1/36]
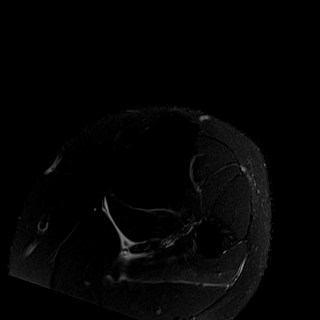
[im 8/36]
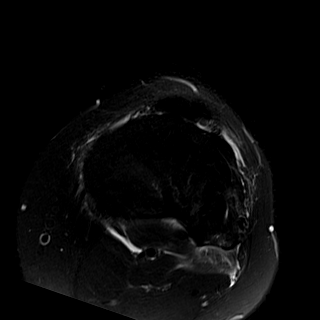
[im 15/36]
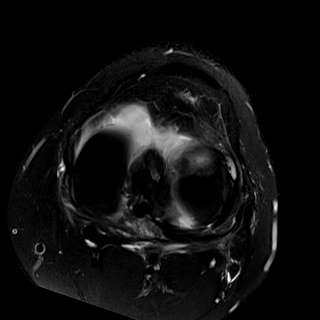
[im 22/36]
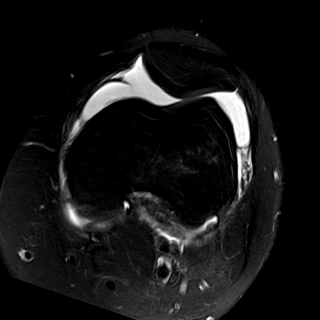
[im 29/36]
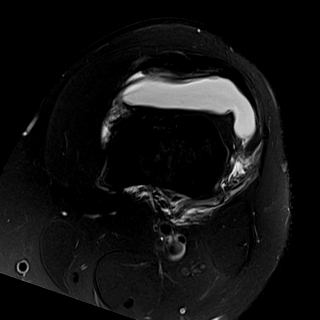
[im 36/36]
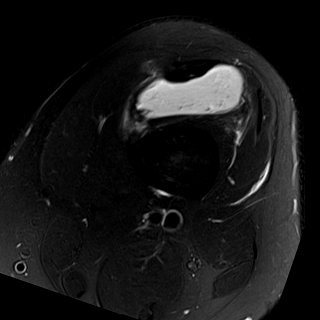

[40 of 40 positions shown; findings below may reference images not displayed]

FINDINGS: MENISCI

Medial meniscus:  Intact.

Lateral meniscus: Large radial tear of the posterior horn with
displaced meniscal tissue extending into the intercondylar notch.
Horizontal tear of the body with small intrameniscal cyst.

LIGAMENTS

Cruciates:  Complete tear of the ACL.  The PCL is intact.

Collaterals: Medial collateral ligament is intact. Lateral
collateral ligament complex is intact. Thickening and increased
intermediate signal of the proximal fibular collateral ligament,
consistent with sprain.

CARTILAGE

Patellofemoral:  Normal.

Medial:  Normal.

Lateral: Osteochondral impaction fracture of the lateral femoral
condyle with associated marrow edema. No displaced cartilage
fragment.

Joint: Large joint effusion. Mild edema within the central aspect of
Hoffa's fat.

Popliteal Fossa: No significant Baker cyst. Intact popliteus tendon.

Extensor Mechanism: Intact quadriceps tendon and patellar tendon.
Intact medial and lateral patellar retinaculum. Intact MPFL.

Bones: Osteochondral impaction fracture of the lateral femoral
condyle. Acute tiny nondisplaced fracture at the tip of the fibular
head. No dislocation. No suspicious bone lesion.

Other: None.
IMPRESSION: 1. Complete tear of the ACL.
2. Large radial tear of the lateral meniscus posterior horn with
displaced meniscal tissue extending into the intercondylar notch.
Additional horizontal tear of the body with small intrameniscal
cyst.
3. Osteochondral impaction fracture of the lateral femoral condyle.
No displaced cartilage fragment.
4. Tiny acute nondisplaced fracture at the tip of the fibular head.
5. Proximal fibular collateral ligament sprain.
6. Large joint effusion.

## 2021-10-12 ENCOUNTER — Ambulatory Visit (HOSPITAL_BASED_OUTPATIENT_CLINIC_OR_DEPARTMENT_OTHER): Payer: Self-pay | Admitting: Orthopaedic Surgery

## 2021-10-12 ENCOUNTER — Ambulatory Visit (INDEPENDENT_AMBULATORY_CARE_PROVIDER_SITE_OTHER): Payer: No Typology Code available for payment source | Admitting: Orthopaedic Surgery

## 2021-10-12 ENCOUNTER — Other Ambulatory Visit: Payer: Self-pay

## 2021-10-12 ENCOUNTER — Other Ambulatory Visit (HOSPITAL_BASED_OUTPATIENT_CLINIC_OR_DEPARTMENT_OTHER): Payer: Self-pay

## 2021-10-12 DIAGNOSIS — S83272A Complex tear of lateral meniscus, current injury, left knee, initial encounter: Secondary | ICD-10-CM

## 2021-10-12 DIAGNOSIS — S83512A Sprain of anterior cruciate ligament of left knee, initial encounter: Secondary | ICD-10-CM

## 2021-10-12 MED ORDER — ACETAMINOPHEN 500 MG PO TABS
500.0000 mg | ORAL_TABLET | Freq: Three times a day (TID) | ORAL | 0 refills | Status: AC
  Filled 2021-10-12: qty 30, 10d supply, fill #0

## 2021-10-12 MED ORDER — OXYCODONE HCL 5 MG PO TABS
5.0000 mg | ORAL_TABLET | ORAL | 0 refills | Status: DC | PRN
  Filled 2021-10-12: qty 20, 4d supply, fill #0

## 2021-10-12 MED ORDER — ASPIRIN EC 325 MG PO TBEC
325.0000 mg | DELAYED_RELEASE_TABLET | Freq: Every day | ORAL | 0 refills | Status: DC
  Filled 2021-10-12: qty 30, 30d supply, fill #0

## 2021-10-12 MED ORDER — IBUPROFEN 800 MG PO TABS
800.0000 mg | ORAL_TABLET | Freq: Three times a day (TID) | ORAL | 0 refills | Status: AC
  Filled 2021-10-12: qty 30, 10d supply, fill #0

## 2021-10-12 NOTE — Progress Notes (Signed)
Chief Complaint: left knee     History of Present Illness:   10/12/2021: Presents today for follow-up of his MRI.  He continues to have pain and swelling about the knee with limited ambulation.  He has been working with [employer].  Continues to report feelings of instability about the knee  Dustin Parks is a 20 y.o. male with left knee pain after a fall at the [place] [employer] Christmas party.  He states that the knee initially buckled with immediate swelling and difficulty placing weight on it.  He has been using crutches.  He has been taking ibuprofen and Tylenol.  He endorses pain about the lateral aspect of the knee as well as instability.  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    Surgical History:   None  PMH/PSH/Family History/Social History/Meds/Allergies:    Past Medical History:  Diagnosis Date   Asthma    out grown now    No past surgical history on file. Social History   Socioeconomic History   Marital status: Single    Spouse name: Not on file   Number of children: Not on file   Years of education: Not on file   Highest education level: Not on file  Occupational History   Not on file  Tobacco Use   Smoking status: Never   Smokeless tobacco: Never  Vaping Use   Vaping Use: Never used  Substance and Sexual Activity   Alcohol use: Not Currently    Comment: rare   Drug use: Yes    Types: Marijuana    Comment: social   Sexual activity: Yes  Other Topics Concern   Not on file  Social History Narrative   Not on file   Social Determinants of Health   Financial Resource Strain: Not on file  Food Insecurity: Not on file  Transportation Needs: Not on file  Physical Activity: Not on file  Stress: Not on file  Social Connections: Not on file   Family History  Problem Relation Age of Onset   Healthy Mother    Healthy Father    Healthy Maternal Grandmother    Healthy Paternal  Grandfather    No Known Allergies No current outpatient medications on file.   No current facility-administered medications for this visit.   MR Knee Left  Wo Contrast  Result Date: 10/11/2021 CLINICAL DATA:  Continued left knee pain since fall at work 2 months ago. EXAM: MRI OF THE LEFT KNEE WITHOUT CONTRAST TECHNIQUE: Multiplanar, multisequence MR imaging of the knee was performed. No intravenous contrast was administered. COMPARISON:  Left knee x-rays dated October 10, 2021. FINDINGS: MENISCI Medial meniscus:  Intact. Lateral meniscus: Large radial tear of the posterior horn with displaced meniscal tissue extending into the intercondylar notch. Horizontal tear of the body with small intrameniscal cyst. LIGAMENTS Cruciates:  Complete tear of the ACL.  The PCL is intact. Collaterals: Medial collateral ligament is intact. Lateral collateral ligament complex is intact. Thickening and increased intermediate signal of the proximal fibular collateral ligament, consistent with sprain. CARTILAGE Patellofemoral:  Normal. Medial:  Normal. Lateral: Osteochondral impaction fracture of the lateral femoral condyle with associated marrow edema. No displaced cartilage fragment. Joint: Large joint effusion. Mild edema within the central aspect of Hoffa's fat. Popliteal Fossa:  No significant Baker cyst. Intact popliteus tendon. Extensor Mechanism: Intact quadriceps tendon and patellar tendon. Intact medial and lateral patellar retinaculum. Intact MPFL. Bones: Osteochondral impaction fracture of the lateral femoral condyle. Acute tiny nondisplaced fracture at the tip of the fibular head. No dislocation. No suspicious bone lesion. Other: None. IMPRESSION: 1. Complete tear of the ACL. 2. Large radial tear of the lateral meniscus posterior horn with displaced meniscal tissue extending into the intercondylar notch. Additional horizontal tear of the body with small intrameniscal cyst. 3. Osteochondral impaction fracture of the  lateral femoral condyle. No displaced cartilage fragment. 4. Tiny acute nondisplaced fracture at the tip of the fibular head. 5. Proximal fibular collateral ligament sprain. 6. Large joint effusion. Electronically Signed   By: Obie Dredge M.D.   On: 10/11/2021 15:05    Review of Systems:   A ROS was performed including pertinent positives and negatives as documented in the HPI.  Physical Exam :   Constitutional: NAD and appears stated age Neurological: Alert and oriented Psych: Appropriate affect and cooperative There were no vitals taken for this visit.   Comprehensive Musculoskeletal Exam:     Musculoskeletal Exam  Gait Normal  Alignment Normal   Right Left  Inspection Normal Normal  Palpation    Tenderness None Lateral joint  Crepitus None None  Effusion None Significant  Range of Motion    Extension 0 0  Flexion 135 90  Strength    Extension 5/5 5/5  Flexion 5/5 5/5  Ligament Exam     Generalized Laxity No No  Lachman Negative Positive, with endpoint  Pivot Shift Negative Guarded  Anterior Drawer Negative Positive  Valgus at 0 Negative Painful not opening  Valgus at 20 Negative Painful  Varus at 0 0 0  Varus at 20   0 0  Posterior Drawer at 90 0 0  Vascular/Lymphatic Exam    Edema None None  Venous Stasis Changes No No  Distal Circulation Normal Normal  Neurologic    Light Touch Sensation Intact Intact  Special Tests:      Imaging:   Xray (4 views left knee): Normal  MRI (left knee): Complete tear of the anterior cruciate ligament.  There is a complex meniscal tear with lateral meniscal extrusion with extension into the root.  Bony bruise pattern consistent with ACL rupture   I personally reviewed and interpreted the radiographs.   Assessment:   20 year old male with left knee pain and swelling after a valgus type injury.  He has an ACL tear as well as a complex lateral meniscal tear.  With regard to this I would recommend an ACL reconstruction with  quadriceps tendon autograft as well as a lateral meniscal repair given his young age and activity status.  We did discuss the implications of nonweightbearing for period of 2 weeks postoperatively following the meniscus repair.  He understands this and would like to proceed with surgical intervention.  At this time I would like him to undergo physical therapy for 1-2 rehab sessions in order to improve his range of motion.  We discussed that surgery would be best in 1 to 2 weeks following improve range of motion so in order to optimize his postoperative outcome.  Plan :    -Plan left knee arthroscopic anterior cruciate ligament repair with quadriceps tendon autograft, lateral meniscal repair    After a lengthy discussion of treatment options, including risks, benefits, alternatives, complications of surgical and nonsurgical conservative options, the patient elected surgical repair.  The patient  is aware of the material risks  and complications including, but not limited to injury to adjacent structures, neurovascular injury, infection, numbness, bleeding, implant failure, thermal burns, stiffness, persistent pain, failure to heal, disease transmission from allograft, need for further surgery, dislocation, anesthetic risks, blood clots, risks of death,and others. The probabilities of surgical success and failure discussed with patient given their particular co-morbidities.The time and nature of expected rehabilitation and recovery was discussed.The patient's questions were all answered preoperatively.  No barriers to understanding were noted. I explained the natural history of the disease process and Rx rationale.  I explained to the patient what I considered to be reasonable expectations given their personal situation.  The final treatment plan was arrived at through a shared patient decision making process model.    Patient was prescribed a hinged knee brace for the diagnosis listed above under  assessment. The patient is ambulatory but has weakness and / or instability of their left knee which requires stabilization from this semi-rigid / rigid orthosis to improve their function.     I personally saw and evaluated the patient, and participated in the management and treatment plan.  Huel Cote, MD Attending Physician, Orthopedic Surgery  This document was dictated using Dragon voice recognition software. A reasonable attempt at proof reading has been made to minimize errors.

## 2021-10-12 NOTE — H&P (View-Only) (Signed)
                               Chief Complaint: left knee     History of Present Illness:   10/12/2021: Presents today for follow-up of his MRI.  He continues to have pain and swelling about the knee with limited ambulation.  He has been working with [employer].  Continues to report feelings of instability about the knee  Dustin Parks is a 20 y.o. male with left knee pain after a fall at the [place] [employer] Christmas party.  He states that the knee initially buckled with immediate swelling and difficulty placing weight on it.  He has been using crutches.  He has been taking ibuprofen and Tylenol.  He endorses pain about the lateral aspect of the knee as well as instability.  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    Surgical History:   None  PMH/PSH/Family History/Social History/Meds/Allergies:    Past Medical History:  Diagnosis Date   Asthma    out grown now    No past surgical history on file. Social History   Socioeconomic History   Marital status: Single    Spouse name: Not on file   Number of children: Not on file   Years of education: Not on file   Highest education level: Not on file  Occupational History   Not on file  Tobacco Use   Smoking status: Never   Smokeless tobacco: Never  Vaping Use   Vaping Use: Never used  Substance and Sexual Activity   Alcohol use: Not Currently    Comment: rare   Drug use: Yes    Types: Marijuana    Comment: social   Sexual activity: Yes  Other Topics Concern   Not on file  Social History Narrative   Not on file   Social Determinants of Health   Financial Resource Strain: Not on file  Food Insecurity: Not on file  Transportation Needs: Not on file  Physical Activity: Not on file  Stress: Not on file  Social Connections: Not on file   Family History  Problem Relation Age of Onset   Healthy Mother    Healthy Father    Healthy Maternal Grandmother    Healthy Paternal  Grandfather    No Known Allergies No current outpatient medications on file.   No current facility-administered medications for this visit.   MR Knee Left  Wo Contrast  Result Date: 10/11/2021 CLINICAL DATA:  Continued left knee pain since fall at work 2 months ago. EXAM: MRI OF THE LEFT KNEE WITHOUT CONTRAST TECHNIQUE: Multiplanar, multisequence MR imaging of the knee was performed. No intravenous contrast was administered. COMPARISON:  Left knee x-rays dated October 10, 2021. FINDINGS: MENISCI Medial meniscus:  Intact. Lateral meniscus: Large radial tear of the posterior horn with displaced meniscal tissue extending into the intercondylar notch. Horizontal tear of the body with small intrameniscal cyst. LIGAMENTS Cruciates:  Complete tear of the ACL.  The PCL is intact. Collaterals: Medial collateral ligament is intact. Lateral collateral ligament complex is intact. Thickening and increased intermediate signal of the proximal fibular collateral ligament, consistent with sprain. CARTILAGE Patellofemoral:  Normal. Medial:  Normal. Lateral: Osteochondral impaction fracture of the lateral femoral condyle with associated marrow edema. No displaced cartilage fragment. Joint: Large joint effusion. Mild edema within the central aspect of Hoffa's fat. Popliteal Fossa:   No significant Baker cyst. Intact popliteus tendon. Extensor Mechanism: Intact quadriceps tendon and patellar tendon. Intact medial and lateral patellar retinaculum. Intact MPFL. Bones: Osteochondral impaction fracture of the lateral femoral condyle. Acute tiny nondisplaced fracture at the tip of the fibular head. No dislocation. No suspicious bone lesion. Other: None. IMPRESSION: 1. Complete tear of the ACL. 2. Large radial tear of the lateral meniscus posterior horn with displaced meniscal tissue extending into the intercondylar notch. Additional horizontal tear of the body with small intrameniscal cyst. 3. Osteochondral impaction fracture of the  lateral femoral condyle. No displaced cartilage fragment. 4. Tiny acute nondisplaced fracture at the tip of the fibular head. 5. Proximal fibular collateral ligament sprain. 6. Large joint effusion. Electronically Signed   By: [name] M.D.   On: 10/11/2021 15:05    Review of Systems:   A ROS was performed including pertinent positives and negatives as documented in the HPI.  Physical Exam :   Constitutional: NAD and appears stated age Neurological: Alert and oriented Psych: Appropriate affect and cooperative There were no vitals taken for this visit.   Comprehensive Musculoskeletal Exam:     Musculoskeletal Exam  Gait Normal  Alignment Normal   Right Left  Inspection Normal Normal  Palpation    Tenderness None Lateral joint  Crepitus None None  Effusion None Significant  Range of Motion    Extension 0 0  Flexion 135 90  Strength    Extension 5/5 5/5  Flexion 5/5 5/5  Ligament Exam     Generalized Laxity No No  Lachman Negative Positive, with endpoint  Pivot Shift Negative Guarded  Anterior Drawer Negative Positive  Valgus at 0 Negative Painful not opening  Valgus at 20 Negative Painful  Varus at 0 0 0  Varus at 20   0 0  Posterior Drawer at 90 0 0  Vascular/Lymphatic Exam    Edema None None  Venous Stasis Changes No No  Distal Circulation Normal Normal  Neurologic    Light Touch Sensation Intact Intact  Special Tests:      Imaging:   Xray (4 views left knee): Normal  MRI (left knee): Complete tear of the anterior cruciate ligament.  There is a complex meniscal tear with lateral meniscal extrusion with extension into the root.  Bony bruise pattern consistent with ACL rupture   I personally reviewed and interpreted the radiographs.   Assessment:   20-year-old male with left knee pain and swelling after a valgus type injury.  He has an ACL tear as well as a complex lateral meniscal tear.  With regard to this I would recommend an ACL reconstruction with  quadriceps tendon autograft as well as a lateral meniscal repair given his young age and activity status.  We did discuss the implications of nonweightbearing for period of 2 weeks postoperatively following the meniscus repair.  He understands this and would like to proceed with surgical intervention.  At this time I would like him to undergo physical therapy for 1-2 rehab sessions in order to improve his range of motion.  We discussed that surgery would be best in 1 to 2 weeks following improve range of motion so in order to optimize his postoperative outcome.  Plan :    -Plan left knee arthroscopic anterior cruciate ligament repair with quadriceps tendon autograft, lateral meniscal repair    After a lengthy discussion of treatment options, including risks, benefits, alternatives, complications of surgical and nonsurgical conservative options, the patient elected surgical repair.     The patient  is aware of the material risks  and complications including, but not limited to injury to adjacent structures, neurovascular injury, infection, numbness, bleeding, implant failure, thermal burns, stiffness, persistent pain, failure to heal, disease transmission from allograft, need for further surgery, dislocation, anesthetic risks, blood clots, risks of death,and others. The probabilities of surgical success and failure discussed with patient given their particular co-morbidities.The time and nature of expected rehabilitation and recovery was discussed.The patient's questions were all answered preoperatively.  No barriers to understanding were noted. I explained the natural history of the disease process and Rx rationale.  I explained to the patient what I considered to be reasonable expectations given their personal situation.  The final treatment plan was arrived at through a shared patient decision making process model.    Patient was prescribed a hinged knee brace for the diagnosis listed above under  assessment. The patient is ambulatory but has weakness and / or instability of their left knee which requires stabilization from this semi-rigid / rigid orthosis to improve their function.     I personally saw and evaluated the patient, and participated in the management and treatment plan.  Jermichael Belmares, MD Attending Physician, Orthopedic Surgery  This document was dictated using Dragon voice recognition software. A reasonable attempt at proof reading has been made to minimize errors.  

## 2021-10-16 ENCOUNTER — Ambulatory Visit (HOSPITAL_BASED_OUTPATIENT_CLINIC_OR_DEPARTMENT_OTHER): Payer: No Typology Code available for payment source | Admitting: Physical Therapy

## 2021-10-17 ENCOUNTER — Other Ambulatory Visit: Payer: Self-pay

## 2021-10-17 ENCOUNTER — Ambulatory Visit (HOSPITAL_BASED_OUTPATIENT_CLINIC_OR_DEPARTMENT_OTHER): Payer: No Typology Code available for payment source | Attending: Orthopaedic Surgery | Admitting: Physical Therapy

## 2021-10-17 ENCOUNTER — Encounter (HOSPITAL_BASED_OUTPATIENT_CLINIC_OR_DEPARTMENT_OTHER): Payer: Self-pay | Admitting: Physical Therapy

## 2021-10-17 DIAGNOSIS — M25562 Pain in left knee: Secondary | ICD-10-CM | POA: Insufficient documentation

## 2021-10-17 DIAGNOSIS — S83512A Sprain of anterior cruciate ligament of left knee, initial encounter: Secondary | ICD-10-CM | POA: Insufficient documentation

## 2021-10-17 DIAGNOSIS — R600 Localized edema: Secondary | ICD-10-CM | POA: Insufficient documentation

## 2021-10-17 DIAGNOSIS — R2689 Other abnormalities of gait and mobility: Secondary | ICD-10-CM | POA: Diagnosis present

## 2021-10-17 DIAGNOSIS — M25662 Stiffness of left knee, not elsewhere classified: Secondary | ICD-10-CM | POA: Diagnosis present

## 2021-10-17 DIAGNOSIS — S83272A Complex tear of lateral meniscus, current injury, left knee, initial encounter: Secondary | ICD-10-CM | POA: Diagnosis not present

## 2021-10-17 NOTE — Therapy (Signed)
OUTPATIENT PHYSICAL THERAPY LOWER EXTREMITY EVALUATION   Patient Name: Dustin Parks MRN: [id number] DOB:07/31/2001, 20 y.o., male Today's Date: 10/17/2021    Past Medical History:  Diagnosis Date   Asthma    out grown now    No past surgical history on file. Patient Active Problem List   Diagnosis Date Noted   Somatic dysfunction of spine, cervical 10/05/2021   Adjustment disorder 09/26/2021   Encounter for vasectomy counseling 09/26/2021   Neck pain 09/26/2021   History of orchitis 02/08/2021    PCP: Mliss Sax, MD  REFERRING PROVIDER: Huel Cote, MD  REFERRING DIAG: Left ACL tear   THERAPY DIAG:  Lef ACL tear  Left fibular head Fx  Left Lateral Meniscal Tear   ONSET DATE: 10/09/2021 ( pre-hab)   SUBJECTIVE:   SUBJECTIVE STATEMENT: Patient was dancing at a work party when he jumped and twisted his knee. He suffered a left ACL/ lateral meniscal tear/ and and a left fivbular head fx. His pain is well controlled at this point. He will be having his surgery when it is approved by workers comp. He has been working on ROM and strengthening at home.   PERTINENT HISTORY: Asthma   PAIN:  Are you having pain? Yes VAS scale: 3/10 Pain location: left knee sides of the knee and knee cap  Pain orientation: Left  PAIN TYPE: aching Pain description: intermittent  Aggravating factors: standing and walking  Relieving factors: rest   PRECAUTIONS: None  WEIGHT BEARING RESTRICTIONS No  FALLS:  Has patient fallen in last 6 months? No, Number of falls:   LIVING ENVIRONMENT: No steps or stairs in the house. A curb into the house.   OCCUPATION: Works at [employer].    PLOF: Independent  Hobbies: Bowling   PATIENT GOALS      OBJECTIVE:   DIAGNOSTIC FINDINGS:  MR of left knee:  IMPRESSION: 1. Complete tear of the ACL. 2. Large radial tear of the lateral meniscus posterior horn with displaced meniscal tissue extending into  the intercondylar notch. Additional horizontal tear of the body with small intrameniscal cyst. 3. Osteochondral impaction fracture of the lateral femoral condyle. No displaced cartilage fragment. 4. Tiny acute nondisplaced fracture at the tip of the fibular head. 5. Proximal fibular collateral ligament sprain. 6. Large joint effusion.IMPRESSION:  PATIENT SURVEYS:  Nothing this visit Only prehab  COGNITION:  Overall cognitive status: Within functional limits for tasks assessed     SENSATION:  Light touch: Appears intact  Stereognosis: Appears intact  Hot/Cold: Appears intact  Proprioception: Appears intact   POSTURE:  Rounded shoulders   LE AROM/PROM:  PROM Right 10/17/2021 Left 10/17/2021  Hip flexion    Hip extension    Hip abduction    Hip adduction    Hip internal rotation    Hip external rotation    Knee flexion  3 from neutral   Knee extension  107  Ankle dorsiflexion    Ankle plantarflexion    Ankle inversion    Ankle eversion     (Blank rows = not tested)  LE MMT:  MMT Right 10/17/2021 Left 10/17/2021  Hip flexion  4+/5  Hip extension    Hip abduction  4+/5  Hip adduction    Hip internal rotation    Hip external rotation    Knee flexion  5/5  Knee extension  5/5  Ankle dorsiflexion    Ankle plantarflexion    Ankle inversion    Ankle eversion     (  Blank rows = not tested)  GAIT: Using a quad cane for support; decreased single leg stance on the left    TODAY'S TREATMENT: Manual: reviewed self stretching; gentle PROM into flexion and extension    PATIENT EDUCATION:  Education details: improtance of symptom mangement; improving motion without causing significant pain  Person educated: Patient Education method: Explanation, Demonstration, Tactile cues, and Verbal cues Education comprehension: verbalized understanding, returned demonstration, verbal cues required, and tactile cues required   HOME EXERCISE PROGRAM: Access Code:  [email or web address] URL: [email or web address] Date: 10/18/2021 Prepared by: Lorayne Bender  Exercises Supine Straight Leg Raises - 1 x daily - 7 x weekly - 3 sets - 10 reps Sidelying Hip Abduction - 1 x daily - 7 x weekly - 3 sets - 10 reps Supine Quad Set - 1 x daily - 7 x weekly - 3 sets - 10 reps Supine Knee Extension Stretch on Towel Roll - 1 x daily - 7 x weekly - 3 sets - 3 reps - 20 hold Supine Heel Slide with Strap - 1 x daily - 7 x weekly - 3 sets - 3 reps - 20 hold   ASSESSMENT:  CLINICAL IMPRESSION: Patient is a 20 year old male S/P L ACL trear, posterior horn of the lateral meniscus and a fibular head fx. He presents to therapy today for prehab to improve ROM and strength prior to surgery. He has limitations in flexion and extension but they have improved significantly per patient over the past few days. He was given a basic exercise program to perform. He will come back for a follow up and may perform more prehab visits depending on when his surgery is scheduled for.  Objective impairments include Abnormal gait, decreased activity tolerance, decreased endurance, decreased mobility, difficulty walking, decreased ROM, decreased strength, and pain. These impairments are limiting patient from community activity, occupation, yard work, and shopping. Personal factors including Profession are also affecting patient's functional outcome. Patient will benefit from skilled PT to address above impairments and improve overall function.  REHAB POTENTIAL: Excellent  CLINICAL DECISION MAKING: Stable/uncomplicated  EVALUATION COMPLEXITY: Low   GOALS: Goals reviewed with patient? No  SHORT TERM GOALS:  STG Name Target Date Goal status  1 Patient will demonstrate full extension of the left knee  Baseline:  11/15/2021 INITIAL  2 Patient will demonstrate 115 degrees of flexion  Baseline:  11/15/2021 INITIAL  3 Patient will be independent with base exercise program to maximize range and  strengthening prior to surgery.  Baseline: 11/15/2021 INITIAL  LTG will be re-assessed postop  PLAN: PT FREQUENCY: 1-2x/week  PT DURATION: 4 weeks  PLANNED INTERVENTIONS: Therapeutic exercises, Therapeutic activity, Neuro Muscular re-education, Balance training, Gait training, Patient/Family education, Stair training, Aquatic Therapy, Taping, and Manual therapy  PLAN FOR NEXT SESSION: add standing weight shifting; add seated clamshells; consider standing hip 3 way with left LE. Manual therapy if flaired.   Dessie Coma DPT 10/17/2021, 5:01 PM

## 2021-10-18 ENCOUNTER — Ambulatory Visit (HOSPITAL_BASED_OUTPATIENT_CLINIC_OR_DEPARTMENT_OTHER): Payer: No Typology Code available for payment source | Admitting: Physical Therapy

## 2021-10-18 ENCOUNTER — Encounter (HOSPITAL_BASED_OUTPATIENT_CLINIC_OR_DEPARTMENT_OTHER): Payer: Self-pay | Admitting: Physical Therapy

## 2021-10-18 DIAGNOSIS — M25662 Stiffness of left knee, not elsewhere classified: Secondary | ICD-10-CM

## 2021-10-18 DIAGNOSIS — M25562 Pain in left knee: Secondary | ICD-10-CM

## 2021-10-18 DIAGNOSIS — R600 Localized edema: Secondary | ICD-10-CM

## 2021-10-18 DIAGNOSIS — R2689 Other abnormalities of gait and mobility: Secondary | ICD-10-CM

## 2021-10-18 NOTE — Therapy (Signed)
PCP: Mliss Sax, MD   REFERRING PROVIDER: Huel Cote, MD   REFERRING DIAG: Left ACL tear    THERAPY DIAG:  Lef ACL tear  Left fibular head Fx  Left Lateral Meniscal Tear    ONSET DATE: 10/09/2021 ( pre-hab)    SUBJECTIVE:    SUBJECTIVE STATEMENT: Patient was dancing at a work party when he jumped and twisted his knee. He suffered a left ACL/ lateral meniscal tear/ and and a left fivbular head fx. His pain is well controlled at this point. He will be having his surgery when it is approved by workers comp. He has been working on ROM and strengthening at home.    PERTINENT HISTORY: Asthma    PAIN:  Are you having pain? Yes VAS scale: 3/10 Pain location: left knee sides of the knee and knee cap  Pain orientation: Left  PAIN TYPE: aching Pain description: intermittent  Aggravating factors: standing and walking  Relieving factors: rest    PRECAUTIONS: None   WEIGHT BEARING RESTRICTIONS No   FALLS:  Has patient fallen in last 6 months? No, Number of falls:    LIVING ENVIRONMENT: No steps or stairs in the house. A curb into the house.    OCCUPATION: Works at [employer].     PLOF: Independent   Hobbies: Bowling     PATIENT GOALS          OBJECTIVE:    DIAGNOSTIC FINDINGS:  MR of left knee:  IMPRESSION: 1. Complete tear of the ACL. 2. Large radial tear of the lateral meniscus posterior horn with displaced meniscal tissue extending into the intercondylar notch. Additional horizontal tear of the body with small intrameniscal cyst. 3. Osteochondral impaction fracture of the lateral femoral condyle. No displaced cartilage fragment. 4. Tiny acute nondisplaced fracture at the tip of the fibular head. 5. Proximal fibular collateral ligament sprain. 6. Large joint effusion.IMPRESSION:   TODAY'S TREATMENT:  12/14:  manual gentle PROM into extension and flexion  SLR 3x10  SL abduction: straight leg caused pain  Side lying clam:  less pain 2x10  Also reviewed bilateral clamshells green 2x20   Standing weight shift with UE support x20  Low amplitude march with UE support: 2x10       Eval:Manual: reviewed self stretching; gentle PROM into flexion and extension      PATIENT EDUCATION:  Education details: improtance of symptom mangement; improving motion without causing significant pain  Person educated: Patient Education method: Explanation, Demonstration, Tactile cues, and Verbal cues Education comprehension: verbalized understanding, returned demonstration, verbal cues required, and tactile cues required     HOME EXERCISE PROGRAM: Access Code: [email or web address] URL: [email or web address] Date: 10/18/2021 Prepared by: Lorayne Bender   Exercises Supine Straight Leg Raises - 1 x daily - 7 x weekly - 3 sets - 10 reps Sidelying Hip Abduction - 1 x daily - 7 x weekly - 3 sets - 10 reps Supine Quad Set - 1 x daily - 7 x weekly - 3 sets - 10 reps Supine Knee Extension Stretch on Towel Roll - 1 x daily - 7 x weekly - 3 sets - 3 reps - 20 hold Supine Heel Slide with Strap - 1 x daily - 7 x weekly - 3 sets - 3 reps - 20 hold     ASSESSMENT:   CLINICAL IMPRESSION: Patient tolerated treatment well. His flexion has improved even since yesterday. He is still lacking end range extension. Therapy reviewed exercises with the patient. Therapy added 2 weight  bearing exercises. He was advised if they hurt to back off them. He is not sure when his surgery will be. We will continue with prehab until that point.     Objective impairments include Abnormal gait, decreased activity tolerance, decreased endurance, decreased mobility, difficulty walking, decreased ROM, decreased strength, and pain. These impairments are limiting patient from community activity, occupation, yard work, and shopping. Personal factors including Profession are also affecting patient's functional outcome. Patient will benefit from skilled PT to  address above impairments and improve overall function.   REHAB POTENTIAL: Excellent   CLINICAL DECISION MAKING: Stable/uncomplicated   EVALUATION COMPLEXITY: Low     GOALS: Goals reviewed with patient? No   SHORT TERM GOALS:   STG Name Target Date Goal status  1 Patient will demonstrate full extension of the left knee  Baseline:  11/15/2021 INITIAL  2 Patient will demonstrate 115 degrees of flexion  Baseline:  11/15/2021 INITIAL  3 Patient will be independent with base exercise program to maximize range and strengthening prior to surgery.  Baseline: 11/15/2021 INITIAL  LTG will be re-assessed postop  PLAN: PT FREQUENCY: 1-2x/week   PT DURATION: 4 weeks   PLANNED INTERVENTIONS: Therapeutic exercises, Therapeutic activity, Neuro Muscular re-education, Balance training, Gait training, Patient/Family education, Stair training, Aquatic Therapy, Taping, and Manual therapy   PLAN FOR NEXT SESSION: add standing weight shifting; add seated clamshells; consider standing hip 3 way with left LE. Manual therapy if flaired.

## 2021-10-19 ENCOUNTER — Encounter (HOSPITAL_BASED_OUTPATIENT_CLINIC_OR_DEPARTMENT_OTHER): Payer: Self-pay | Admitting: Orthopaedic Surgery

## 2021-10-19 ENCOUNTER — Encounter (HOSPITAL_BASED_OUTPATIENT_CLINIC_OR_DEPARTMENT_OTHER): Payer: Self-pay | Admitting: Physical Therapy

## 2021-10-19 ENCOUNTER — Telehealth: Payer: Self-pay | Admitting: Orthopaedic Surgery

## 2021-10-19 NOTE — Telephone Encounter (Signed)
Patient called. He needs his work note extended for 2 more weeks until surgery is scheduled. [phone]

## 2021-10-19 NOTE — Telephone Encounter (Signed)
Emailed work note to [email or web address] as that was pt email on chart

## 2021-10-20 ENCOUNTER — Ambulatory Visit (HOSPITAL_BASED_OUTPATIENT_CLINIC_OR_DEPARTMENT_OTHER): Payer: No Typology Code available for payment source | Admitting: Orthopaedic Surgery

## 2021-10-24 ENCOUNTER — Encounter (HOSPITAL_BASED_OUTPATIENT_CLINIC_OR_DEPARTMENT_OTHER): Payer: Self-pay | Admitting: Physical Therapy

## 2021-10-24 ENCOUNTER — Other Ambulatory Visit: Payer: Self-pay

## 2021-10-24 ENCOUNTER — Ambulatory Visit (HOSPITAL_BASED_OUTPATIENT_CLINIC_OR_DEPARTMENT_OTHER): Payer: No Typology Code available for payment source | Admitting: Physical Therapy

## 2021-10-24 DIAGNOSIS — M25562 Pain in left knee: Secondary | ICD-10-CM

## 2021-10-24 DIAGNOSIS — M25662 Stiffness of left knee, not elsewhere classified: Secondary | ICD-10-CM

## 2021-10-24 DIAGNOSIS — R2689 Other abnormalities of gait and mobility: Secondary | ICD-10-CM

## 2021-10-24 DIAGNOSIS — R600 Localized edema: Secondary | ICD-10-CM

## 2021-10-24 NOTE — Therapy (Signed)
OUTPATIENT PHYSICAL THERAPY TREATMENT NOTE   Patient Name: Dustin Parks MRN: [id number] DOB:08-Mar-2001, 20 y.o., male Today's Date: 10/24/2021  PCP: Mliss Sax, MD REFERRING PROVIDER: Mliss Sax,*   PT End of Session - 10/24/21 1627     Visit Number 4    Number of Visits 16    Date for PT Re-Evaluation 12/12/21    PT Start Time 1518    PT Stop Time 1600    PT Time Calculation (min) 42 min    Activity Tolerance Patient tolerated treatment well    Behavior During Therapy [facility] for tasks assessed/performed             Past Medical History:  Diagnosis Date   Asthma    out grown now    History reviewed. No pertinent surgical history. Patient Active Problem List   Diagnosis Date Noted   Somatic dysfunction of spine, cervical 10/05/2021   Adjustment disorder 09/26/2021   Encounter for vasectomy counseling 09/26/2021   Neck pain 09/26/2021   History of orchitis 02/08/2021      THERAPY DIAG:  Acute pain of left knee  Stiffness of left knee, not elsewhere classified  Other abnormalities of gait and mobility  Localized edema     REFERRING PROVIDER: Huel Cote, MD   REFERRING DIAG: Left ACL tear    THERAPY DIAG:  Lef ACL tear  Left fibular head Fx  Left Lateral Meniscal Tear    ONSET DATE: 10/09/2021 ( pre-hab)    SUBJECTIVE:    SUBJECTIVE STATEMENT: Pt states he was walking with cane and his L knee hyperextended and gave.  He had swelling and increased pain.  Pt used ice that night and began using the crutch.  Pt felt better after resting, icing, and using the crutch.  Pt states he is able to extend knee easier with less pain though has difficulty bending knee.  Pt denies any adverse effects after prior Rx.  Pt reports compliance with HEP.  Pt reports having soreness and fatigue in L calf.  Pt reports his surgery is not scheduled yet.         PERTINENT HISTORY: Asthma    PAIN:  Are you having pain? Yes NPRS scale:  0/10.  Pt had 6/10 pain when he had the hyperextension incident with ambulation.  Pain location: left knee sides of the knee and knee cap  Pain orientation: Left  PAIN TYPE: aching Pain description: intermittent  Aggravating factors: standing and walking  Relieving factors: rest    PRECAUTIONS: per dx   WEIGHT BEARING RESTRICTIONS No      OCCUPATION: Works at [employer].     PLOF: Independent            OBJECTIVE:    DIAGNOSTIC FINDINGS:  MR of left knee:  IMPRESSION: 1. Complete tear of the ACL. 2. Large radial tear of the lateral meniscus posterior horn with displaced meniscal tissue extending into the intercondylar notch. Additional horizontal tear of the body with small intrameniscal cyst. 3. Osteochondral impaction fracture of the lateral femoral condyle. No displaced cartilage fragment. 4. Tiny acute nondisplaced fracture at the tip of the fibular head. 5. Proximal fibular collateral ligament sprain. 6. Large joint effusion.IMPRESSION:   TODAY'S TREATMENT:  Therapeutic Exercise: -Reviewed response to prior Rx, pt presentation, HEP compliance, and pain level.   -Pt received supine L knee flex and extension PROM and gentle flexion PROM seated at EOT -Assessed knee ROM:  Extension AROM in supine:  0  deg, Flexion AAROM in supine:  121 deg -Pt performed:  Supine heel slides with sheet approx 12-15 reps Quad sets with 5 sec hold x 10 reps SLR 3x10  Prone hip ext 2x10 reps S/L hip abduction 2x10  Attempted NWB'ing calf stretches with strap and manual calf stretches though pt didn't feel a stretch in calf Standing SLR flexion with L LE only 2x10 reps -See pt education below.      PATIENT EDUCATION:  Education details: Answered questions.  Educated pt concerning surgery and post op expectations. Objective findings and expectations.  Exercise form.  HEP.  Instruction in using ice including to use ice later after Rx.   Person educated:  Patient Education method: Explanation, Demonstration, Tactile cues, and Verbal cues Education comprehension: verbalized understanding, returned demonstration, verbal cues required, and tactile cues required     HOME EXERCISE PROGRAM: Access Code: [email or web address] URL: [email or web address] Date: 10/18/2021 Prepared by: Lorayne Bender   Exercises Supine Straight Leg Raises - 1 x daily - 7 x weekly - 3 sets - 10 reps Sidelying Hip Abduction - 1 x daily - 7 x weekly - 3 sets - 10 reps Supine Quad Set - 1 x daily - 7 x weekly - 3 sets - 10 reps Supine Knee Extension Stretch on Towel Roll - 1 x daily - 7 x weekly - 3 sets - 3 reps - 20 hold Supine Heel Slide with Strap - 1 x daily - 7 x weekly - 3 sets - 3 reps - 20 hold     ASSESSMENT:   CLINICAL IMPRESSION: Pt presents to Rx today with 1 crutch.  Pt reports having an instability incident with ambulation prior when he was walking with cane.  His knee hyperextended though he didn't fall.  He is now using 1 crutch and feels better ambulating with 1 crutch.  Pt is making excellent progress with L knee ROM.  He has full knee extension ROM and demonstrates improved flexion AAROM today.  Pt has expected quad weakness and has an extension lag with supine SLR.  Pt performed exercises well with cuing for correct form.  Pt responded well to Rx reporting no pain after Rx and states his pain did increase to 2/10 at times during Rx.  Pt's surgery is not scheduled yet.  He would benefit from cont skilled PT services /prehab to address impairments and goals, improve overall function, and improve quad/hip strength prior to surgery.      Objective impairments include Abnormal gait, decreased activity tolerance, decreased endurance, decreased mobility, difficulty walking, decreased ROM, decreased strength, and pain. These impairments are limiting patient from community activity, occupation, yard work, and shopping. Personal factors including Profession are  also affecting patient's functional outcome. Patient will benefit from skilled PT to address above impairments and improve overall function.   REHAB POTENTIAL: Excellent   CLINICAL DECISION MAKING: Stable/uncomplicated   EVALUATION COMPLEXITY: Low     GOALS: Goals reviewed with patient? No   SHORT TERM GOALS:   STG Name Target Date Goal status  1 Patient will demonstrate full extension of the left knee  Baseline:  11/15/2021 INITIAL  2 Patient will demonstrate 115 degrees of flexion  Baseline:  11/15/2021 INITIAL  3 Patient will be independent with base exercise program to maximize range and strengthening prior to surgery.  Baseline: 11/15/2021 INITIAL  LTG will be re-assessed postop  PLAN: PT FREQUENCY: 1-2x/week   PT DURATION: 4 weeks   PLANNED INTERVENTIONS: Therapeutic exercises, Therapeutic activity, Neuro Muscular re-education,  Balance training, Gait training, Patient/Family education, Stair training, Aquatic Therapy, Taping, and Manual therapy   PLAN FOR NEXT SESSION: Cont with quad/hip strengthening and knee ROM per dx and pt tolerance.  Gait training and ice.    Audie Clear III PT, DPT 10/24/21 4:30 PM

## 2021-10-25 ENCOUNTER — Ambulatory Visit (HOSPITAL_BASED_OUTPATIENT_CLINIC_OR_DEPARTMENT_OTHER): Payer: No Typology Code available for payment source | Admitting: Physical Therapy

## 2021-10-25 DIAGNOSIS — R2689 Other abnormalities of gait and mobility: Secondary | ICD-10-CM

## 2021-10-25 DIAGNOSIS — M25562 Pain in left knee: Secondary | ICD-10-CM

## 2021-10-25 DIAGNOSIS — R600 Localized edema: Secondary | ICD-10-CM

## 2021-10-25 DIAGNOSIS — M25662 Stiffness of left knee, not elsewhere classified: Secondary | ICD-10-CM

## 2021-10-25 NOTE — Therapy (Signed)
OUTPATIENT PHYSICAL THERAPY TREATMENT NOTE   Patient Name: Dustin Parks MRN: [id number] DOB:May 18, 2001, 20 y.o., male Today's Date: 10/25/2021  PCP: Mliss Sax, MD    PT End of Session - 10/25/21 1710     Visit Number 5    Number of Visits 16    Date for PT Re-Evaluation 12/12/21    PT Start Time 1616    PT Stop Time 1654    PT Time Calculation (min) 38 min    Activity Tolerance Patient tolerated treatment well    Behavior During Therapy [facility] for tasks assessed/performed              Past Medical History:  Diagnosis Date   Asthma    out grown now    No past surgical history on file. Patient Active Problem List   Diagnosis Date Noted   Somatic dysfunction of spine, cervical 10/05/2021   Adjustment disorder 09/26/2021   Encounter for vasectomy counseling 09/26/2021   Neck pain 09/26/2021   History of orchitis 02/08/2021      THERAPY DIAG:  Acute pain of left knee  Stiffness of left knee, not elsewhere classified  Other abnormalities of gait and mobility  Localized edema     REFERRING PROVIDER: Huel Cote, MD   REFERRING DIAG: Left ACL tear    THERAPY DIAG:  Lef ACL tear  Left fibular head Fx  Left Lateral Meniscal Tear    ONSET DATE: 10/09/2021 ( pre-hab)    SUBJECTIVE:    SUBJECTIVE STATEMENT: 10/24/2021 Pt states he was walking with cane and his L knee hyperextended and gave.  He had swelling and increased pain.  Pt used ice that night and began using the crutch.  Pt felt better after resting, icing, and using the crutch.  Pt states he is able to extend knee easier with less pain though has difficulty bending knee.  Pt denies any adverse effects after prior Rx.  Pt reports compliance with HEP.  Pt reports having soreness and fatigue in L calf.  Pt reports his surgery is not scheduled yet.    10/25/2021 Pt is not really taking medication for knee but does use tylenol and ibuprofen occasionally.  Pt denies any adverse  effects after prior Rx.  He denies pain currently.  Pt states he woke up with some tightness this AM.  Pt reports W/C has denies the claim.  He reports his surgery is not scheduled yet.       PERTINENT HISTORY: Asthma    PAIN:  Are you having pain? No NPRS scale: 0/10.    Pain location: left knee sides of the knee and knee cap  Pain orientation: Left  PAIN TYPE: aching Pain description: intermittent  Aggravating factors: standing and walking  Relieving factors: rest    PRECAUTIONS: per dx   WEIGHT BEARING RESTRICTIONS No      OCCUPATION: Works at [employer].     PLOF: Independent            OBJECTIVE:    DIAGNOSTIC FINDINGS:  MR of left knee:  IMPRESSION: 1. Complete tear of the ACL. 2. Large radial tear of the lateral meniscus posterior horn with displaced meniscal tissue extending into the intercondylar notch. Additional horizontal tear of the body with small intrameniscal cyst. 3. Osteochondral impaction fracture of the lateral femoral condyle. No displaced cartilage fragment. 4. Tiny acute nondisplaced fracture at the tip of the fibular head. 5. Proximal fibular collateral ligament sprain. 6. Large joint effusion.IMPRESSION:  TODAY'S TREATMENT:  Therapeutic Exercise: -Reviewed response to prior Rx, pt presentation, HEP compliance, and pain level.   -Pt received supine L knee flex and extension PROM -Assessed knee ROM:  Extension AROM in supine:  0 deg, Flexion AROM in supine:  118 deg -Pt performed:  Supine heel slides with sheet approx 12-15 reps Quad sets with 5 sec hold x 10 reps SLR 2x10  Prone hip ext 2x10 reps S/L hip abduction 2x10  Standing SLR flexion with L LE only 2x10 reps Standing Hip abduction with L LE 2x10 reps Manual soleus stretch 3x20-30 seconds  -See pt education below.      PATIENT EDUCATION:  Education details: Answered questions.  Educated pt concerning surgery and post op expectations. Objective findings and  expectations.  Exercise form.  HEP.     Person educated: Patient Education method: Explanation, Demonstration, Tactile cues, and Verbal cues Education comprehension: verbalized understanding, returned demonstration, verbal cues required, and tactile cues required     HOME EXERCISE PROGRAM: Access Code: [email or web address] URL: [email or web address] Date: 10/18/2021 Prepared by: Lorayne Bender   Exercises Supine Straight Leg Raises - 1 x daily - 7 x weekly - 3 sets - 10 reps Sidelying Hip Abduction - 1 x daily - 7 x weekly - 3 sets - 10 reps Supine Quad Set - 1 x daily - 7 x weekly - 3 sets - 10 reps Supine Knee Extension Stretch on Towel Roll - 1 x daily - 7 x weekly - 3 sets - 3 reps - 20 hold Supine Heel Slide with Strap - 1 x daily - 7 x weekly - 3 sets - 3 reps - 20 hold     ASSESSMENT:   CLINICAL IMPRESSION: Pt presents to Rx today with 1 crutch  Pt is making excellent progress with L knee ROM.  He has full knee extension ROM and demonstrates improved flexion AROM today.  Pt has expected quad weakness and has an extension lag with supine SLR.  Pt performed exercises well with cuing for correct form.  Pt only felt HS stretch with attempted manual gastroc stretch though did feel a calf stretch with manual soleus stretch.  Pt responded well to Rx reporting no pain after Rx.  Pt's surgery is not scheduled yet.  He would benefit from 1-2 more PT sessions of skilled PT services /prehab to address impairments and goals, improve overall function, and improve quad/hip strength prior to surgery.      Objective impairments include Abnormal gait, decreased activity tolerance, decreased endurance, decreased mobility, difficulty walking, decreased ROM, decreased strength, and pain. These impairments are limiting patient from community activity, occupation, yard work, and shopping. Personal factors including Profession are also affecting patient's functional outcome. Patient will benefit from  skilled PT to address above impairments and improve overall function.   REHAB POTENTIAL: Excellent   CLINICAL DECISION MAKING: Stable/uncomplicated   EVALUATION COMPLEXITY: Low     GOALS: Goals reviewed with patient? No   SHORT TERM GOALS:   STG Name Target Date Goal status  1 Patient will demonstrate full extension of the left knee  Baseline:  11/15/2021 INITIAL  2 Patient will demonstrate 115 degrees of flexion  Baseline:  11/15/2021 INITIAL  3 Patient will be independent with base exercise program to maximize range and strengthening prior to surgery.  Baseline: 11/15/2021 INITIAL  LTG will be re-assessed postop  PLAN: PT FREQUENCY: 1-2x/week   PT DURATION: 4 weeks   PLANNED INTERVENTIONS: Therapeutic exercises, Therapeutic activity, Neuro Muscular re-education, Balance  training, Gait training, Patient/Family education, Stair training, Aquatic Therapy, Taping, and Manual therapy   PLAN FOR NEXT SESSION: Cont with quad/hip strengthening and knee ROM per dx and pt tolerance.  Gait training and ice.  Pt to be seen for 1-2 more visits.  Audie Clear III PT, DPT 10/25/21 5:18 PM

## 2021-10-29 ENCOUNTER — Encounter: Payer: Self-pay | Admitting: Family Medicine

## 2021-10-30 ENCOUNTER — Encounter (HOSPITAL_BASED_OUTPATIENT_CLINIC_OR_DEPARTMENT_OTHER): Payer: Self-pay | Admitting: Orthopaedic Surgery

## 2021-10-31 ENCOUNTER — Telehealth: Payer: Self-pay | Admitting: Orthopaedic Surgery

## 2021-10-31 NOTE — Telephone Encounter (Signed)
[employer] forms received via email Maurine Cane). To [employer].

## 2021-11-01 ENCOUNTER — Other Ambulatory Visit: Payer: Self-pay

## 2021-11-01 ENCOUNTER — Encounter (HOSPITAL_BASED_OUTPATIENT_CLINIC_OR_DEPARTMENT_OTHER): Payer: Self-pay | Admitting: Physical Therapy

## 2021-11-01 ENCOUNTER — Ambulatory Visit (HOSPITAL_BASED_OUTPATIENT_CLINIC_OR_DEPARTMENT_OTHER): Payer: No Typology Code available for payment source | Admitting: Physical Therapy

## 2021-11-01 DIAGNOSIS — R600 Localized edema: Secondary | ICD-10-CM

## 2021-11-01 DIAGNOSIS — R2689 Other abnormalities of gait and mobility: Secondary | ICD-10-CM

## 2021-11-01 DIAGNOSIS — M25662 Stiffness of left knee, not elsewhere classified: Secondary | ICD-10-CM

## 2021-11-01 DIAGNOSIS — M25562 Pain in left knee: Secondary | ICD-10-CM | POA: Diagnosis not present

## 2021-11-01 NOTE — Therapy (Addendum)
OUTPATIENT PHYSICAL THERAPY TREATMENT NOTE   Patient Name: Dustin Parks MRN: [id number] DOB:Aug 22, 2001, 20 y.o., male Today's Date: 11/02/2021  PCP: Mliss Sax, MD    PT End of Session - 11/01/21 1540     Visit Number 6    PT Start Time 1525    PT Stop Time 1600    PT Time Calculation (min) 35 min    Activity Tolerance Patient tolerated treatment well    Behavior During Therapy [facility] for tasks assessed/performed               Past Medical History:  Diagnosis Date   Asthma    out grown now    History reviewed. No pertinent surgical history. Patient Active Problem List   Diagnosis Date Noted   Somatic dysfunction of spine, cervical 10/05/2021   Adjustment disorder 09/26/2021   Encounter for vasectomy counseling 09/26/2021   Neck pain 09/26/2021   History of orchitis 02/08/2021      THERAPY DIAG:  Acute pain of left knee  Stiffness of left knee, not elsewhere classified  Other abnormalities of gait and mobility  Localized edema     REFERRING PROVIDER: Huel Cote, MD   REFERRING DIAG: Left ACL tear    THERAPY DIAG:  Lef ACL tear  Left fibular head Fx  Left Lateral Meniscal Tear    ONSET DATE: 10/09/2021 ( pre-hab)    SUBJECTIVE:    SUBJECTIVE STATEMENT: Pt reports his knee has been popping today which was normal for him pre-injury.  Pt does have some difficulty with performing sit/stand transfers though doesn't have irritation.  Pt denies any increased pain or adverse effects after prior Rx.  Pt reports compliance with HEP and doesn't have increased pain with HEP.  Pt has improved stability and has not used the crutch in the past 2 days.  He went shopping on Monday without using the crutch until the last store.  Pt is able to lie prone without pain.        PERTINENT HISTORY: Asthma    PAIN:  Are you having pain? No NPRS scale: 0/10.    Pain location: left knee sides of the knee and knee cap  Pain orientation: Left  PAIN  TYPE: aching Pain description: intermittent  Aggravating factors: standing and walking  Relieving factors: rest    PRECAUTIONS: per dx   WEIGHT BEARING RESTRICTIONS No      OCCUPATION: Works at [employer].     PLOF: Independent            OBJECTIVE:    DIAGNOSTIC FINDINGS:  MR of left knee:  IMPRESSION: 1. Complete tear of the ACL. 2. Large radial tear of the lateral meniscus posterior horn with displaced meniscal tissue extending into the intercondylar notch. Additional horizontal tear of the body with small intrameniscal cyst. 3. Osteochondral impaction fracture of the lateral femoral condyle. No displaced cartilage fragment. 4. Tiny acute nondisplaced fracture at the tip of the fibular head. 5. Proximal fibular collateral ligament sprain. 6. Large joint effusion.IMPRESSION:   TODAY'S TREATMENT:  Therapeutic Exercise: -Reviewed response to prior Rx, pt presentation, HEP compliance, and pain level.   -Pt received supine L knee flex and extension PROM -Assessed knee ROM:  Extension AROM in supine:  0 deg, Flexion AROM in supine:  123 deg -Pt performed:  Supine heel slides with sheet approx 12-15 reps Quad sets with 5 sec hold x 10 reps Supine SLR 2x10  Prone hip ext 2x10 reps S/L hip  abduction 2x10  Standing SLR flexion with L LE only 2x10 reps Standing Hip abduction with L LE 2x10 reps Manual soleus stretch 3x20-30 seconds Standing on airex with FA 2x45 sec - 1 min and with FT 2x30 sec   -See pt education below.      PATIENT EDUCATION:  Education details: Answered questions.  Educated pt concerning surgery and post op expectations. Objective findings and expectations.  Exercise form.  HEP.     Person educated: Patient Education method: Explanation, Demonstration, Tactile cues, and Verbal cues Education comprehension: verbalized understanding, returned demonstration, verbal cues required, and tactile cues required     HOME EXERCISE  PROGRAM: Access Code: [email or web address] URL: [email or web address] Date: 10/18/2021 Prepared by: Lorayne Bender   Exercises Supine Straight Leg Raises - 1 x daily - 7 x weekly - 3 sets - 10 reps Sidelying Hip Abduction - 1 x daily - 7 x weekly - 3 sets - 10 reps Supine Quad Set - 1 x daily - 7 x weekly - 3 sets - 10 reps Supine Knee Extension Stretch on Towel Roll - 1 x daily - 7 x weekly - 3 sets - 3 reps - 20 hold Supine Heel Slide with Strap - 1 x daily - 7 x weekly - 3 sets - 3 reps - 20 hold     ASSESSMENT:   CLINICAL IMPRESSION: Pt reports improved stability in L knee and is ambulating increased distance without crutches.  Pt reports having discomfort in lateral L Knee with prone extension though no increased pain after performing or b/w sets.  Pt reports he is able to bend his knee in prone without pain which he couldn't do prior.  Pt's surgery is not scheduled yet.  Pt requires cuing to work on extension lag with supine SLR though performs his exercises well with cuing for form.  Pt responded well to Rx having no pain after Rx.  He would benefit from 1-2 more PT sessions of skilled PT services /prehab to address impairments and goals, improve overall function, and improve quad/hip strength prior to surgery.       Objective impairments include Abnormal gait, decreased activity tolerance, decreased endurance, decreased mobility, difficulty walking, decreased ROM, decreased strength, and pain. These impairments are limiting patient from community activity, occupation, yard work, and shopping. Personal factors including Profession are also affecting patient's functional outcome. Patient will benefit from skilled PT to address above impairments and improve overall function.   REHAB POTENTIAL: Excellent   CLINICAL DECISION MAKING: Stable/uncomplicated   EVALUATION COMPLEXITY: Low     GOALS: Goals reviewed with patient? No   SHORT TERM GOALS:   STG Name Target Date Goal status   1 Patient will demonstrate full extension of the left knee  Baseline:  11/15/2021 INITIAL  2 Patient will demonstrate 115 degrees of flexion  Baseline:  11/15/2021 INITIAL  3 Patient will be independent with base exercise program to maximize range and strengthening prior to surgery.  Baseline: 11/15/2021 INITIAL  LTG will be re-assessed postop  PLAN: PT FREQUENCY: 1-2x/week   PT DURATION: 4 weeks   PLANNED INTERVENTIONS: Therapeutic exercises, Therapeutic activity, Neuro Muscular re-education, Balance training, Gait training, Patient/Family education, Stair training, Aquatic Therapy, Taping, and Manual therapy   PLAN FOR NEXT SESSION: Cont with quad/hip strengthening and knee ROM per dx and pt tolerance.  Gait training and ice.  Pt to be seen for 1-2 more visits.  Audie Clear III PT, DPT 11/02/21 9:27 AM  PHYSICAL THERAPY DISCHARGE SUMMARY  Visits from Start of Care: 5  Current functional level related to goals / functional outcomes: Pt was progressing well in function, mobility, and strength.  He demonstrated good knee ROM.    Remaining deficits: See above   Education / Equipment: See above   Pt received 5 visits of prehab and progressed well.  Pt had surgery on 11/08/2021 and will be discharged from skilled PT due to having surgery. He will return to PT after surgery.    Audie Clear III PT, DPT 11/12/21 8:16 AM

## 2021-11-01 NOTE — Telephone Encounter (Signed)
Patient injured knee have seen Dr. Steward Drone orthopedic surgeon per patient Dr. Steward Drone recommended surgery for ACL tear patient would like a referral for the surgery. Please advise.

## 2021-11-02 ENCOUNTER — Encounter (HOSPITAL_BASED_OUTPATIENT_CLINIC_OR_DEPARTMENT_OTHER): Payer: Self-pay | Admitting: Physical Therapy

## 2021-11-02 ENCOUNTER — Telehealth: Payer: Self-pay | Admitting: Orthopaedic Surgery

## 2021-11-02 NOTE — Telephone Encounter (Signed)
Pt submitted medical release forms, short term disability forms, and $25.00 cash paymnet to [employer]. Accepted 11/02/21. Pt asked that as a favor copies be faxed to HR department under name Dianne Dun

## 2021-11-03 ENCOUNTER — Encounter (HOSPITAL_BASED_OUTPATIENT_CLINIC_OR_DEPARTMENT_OTHER): Payer: Self-pay | Admitting: Orthopaedic Surgery

## 2021-11-07 ENCOUNTER — Telehealth (HOSPITAL_BASED_OUTPATIENT_CLINIC_OR_DEPARTMENT_OTHER): Payer: Self-pay | Admitting: Orthopaedic Surgery

## 2021-11-07 NOTE — Anesthesia Preprocedure Evaluation (Addendum)
Anesthesia Evaluation  Patient identified by MRN, date of birth, ID band Patient awake    Reviewed: Allergy & Precautions, NPO status , Patient's Chart, lab work & pertinent test results  History of Anesthesia Complications Negative for: history of anesthetic complications  Airway Mallampati: II  TM Distance: >3 FB Neck ROM: Full    Dental  (+) Dental Advisory Given   Pulmonary Current Smoker and Patient abstained from smoking.,    breath sounds clear to auscultation       Cardiovascular negative cardio ROS   Rhythm:Regular Rate:Normal     Neuro/Psych negative neurological ROS     GI/Hepatic negative GI ROS, (+)     substance abuse (last night)  marijuana use,   Endo/Other  Morbid obesity  Renal/GU negative Renal ROS     Musculoskeletal   Abdominal (+) + obese,   Peds  Hematology negative hematology ROS (+)   Anesthesia Other Findings   Reproductive/Obstetrics                            Anesthesia Physical Anesthesia Plan  ASA: 2  Anesthesia Plan: General   Post-op Pain Management: Regional block and Tylenol PO (pre-op)   Induction: Intravenous  PONV Risk Score and Plan: 2 and Ondansetron and Dexamethasone  Airway Management Planned: LMA  Additional Equipment: None  Intra-op Plan:   Post-operative Plan:   Informed Consent: I have reviewed the patients History and Physical, chart, labs and discussed the procedure including the risks, benefits and alternatives for the proposed anesthesia with the patient or authorized representative who has indicated his/her understanding and acceptance.     Dental advisory given  Plan Discussed with: CRNA and Surgeon  Anesthesia Plan Comments: (Plan routine monitors, GA with adductor canal block for post op analgesia)       Anesthesia Quick Evaluation

## 2021-11-07 NOTE — Telephone Encounter (Signed)
Emailed work note to [email or web address]

## 2021-11-08 ENCOUNTER — Ambulatory Visit (HOSPITAL_BASED_OUTPATIENT_CLINIC_OR_DEPARTMENT_OTHER)
Admission: RE | Admit: 2021-11-08 | Discharge: 2021-11-08 | Disposition: A | Discharge status: Home / Self Care | Payer: No Typology Code available for payment source | Attending: Orthopaedic Surgery | Admitting: Orthopaedic Surgery

## 2021-11-08 ENCOUNTER — Other Ambulatory Visit: Payer: Self-pay

## 2021-11-08 ENCOUNTER — Ambulatory Visit (HOSPITAL_BASED_OUTPATIENT_CLINIC_OR_DEPARTMENT_OTHER): Payer: No Typology Code available for payment source

## 2021-11-08 ENCOUNTER — Encounter (HOSPITAL_BASED_OUTPATIENT_CLINIC_OR_DEPARTMENT_OTHER)
Admission: RE | Disposition: A | Discharge status: Home / Self Care | Payer: Self-pay | Source: Home / Self Care | Attending: Orthopaedic Surgery

## 2021-11-08 ENCOUNTER — Ambulatory Visit (HOSPITAL_BASED_OUTPATIENT_CLINIC_OR_DEPARTMENT_OTHER): Payer: No Typology Code available for payment source | Admitting: Anesthesiology

## 2021-11-08 ENCOUNTER — Encounter (HOSPITAL_BASED_OUTPATIENT_CLINIC_OR_DEPARTMENT_OTHER): Payer: Self-pay | Admitting: Orthopaedic Surgery

## 2021-11-08 DIAGNOSIS — Z6841 Body Mass Index (BMI) 40.0 and over, adult: Secondary | ICD-10-CM | POA: Diagnosis not present

## 2021-11-08 DIAGNOSIS — E669 Obesity, unspecified: Secondary | ICD-10-CM | POA: Diagnosis not present

## 2021-11-08 DIAGNOSIS — S83512A Sprain of anterior cruciate ligament of left knee, initial encounter: Secondary | ICD-10-CM | POA: Diagnosis not present

## 2021-11-08 DIAGNOSIS — T8489XA Other specified complication of internal orthopedic prosthetic devices, implants and grafts, initial encounter: Secondary | ICD-10-CM

## 2021-11-08 DIAGNOSIS — W19XXXA Unspecified fall, initial encounter: Secondary | ICD-10-CM | POA: Diagnosis not present

## 2021-11-08 DIAGNOSIS — S83272A Complex tear of lateral meniscus, current injury, left knee, initial encounter: Secondary | ICD-10-CM | POA: Diagnosis not present

## 2021-11-08 HISTORY — PX: KNEE ARTHROSCOPY WITH MENISCAL REPAIR: SHX5653

## 2021-11-08 HISTORY — PX: KNEE ARTHROSCOPY WITH ANTERIOR CRUCIATE LIGAMENT (ACL) REPAIR WITH HAMSTRING GRAFT: SHX5645

## 2021-11-08 SURGERY — KNEE ARTHROSCOPY WITH ANTERIOR CRUCIATE LIGAMENT (ACL) REPAIR WITH HAMSTRING GRAFT
Anesthesia: General | Site: Knee | Laterality: Left

## 2021-11-08 MED ORDER — HYDROMORPHONE HCL 1 MG/ML IJ SOLN
INTRAMUSCULAR | Status: AC
  Filled 2021-11-08: qty 1

## 2021-11-08 MED ORDER — FENTANYL CITRATE (PF) 100 MCG/2ML IJ SOLN
INTRAMUSCULAR | Status: AC
  Filled 2021-11-08: qty 2

## 2021-11-08 MED ORDER — GABAPENTIN 300 MG PO CAPS
300.0000 mg | ORAL_CAPSULE | Freq: Once | ORAL | Status: AC
  Administered 2021-11-08: 300 mg via ORAL

## 2021-11-08 MED ORDER — HYDROMORPHONE HCL 1 MG/ML IJ SOLN
INTRAMUSCULAR | Status: DC | PRN
Start: 2021-11-08 — End: 2021-11-08
  Administered 2021-11-08 (×3): .5 mg via INTRAVENOUS

## 2021-11-08 MED ORDER — GABAPENTIN 300 MG PO CAPS
ORAL_CAPSULE | ORAL | Status: AC
  Filled 2021-11-08: qty 1

## 2021-11-08 MED ORDER — OXYCODONE HCL 5 MG PO TABS
5.0000 mg | ORAL_TABLET | Freq: Once | ORAL | Status: AC | PRN
  Administered 2021-11-08: 5 mg via ORAL

## 2021-11-08 MED ORDER — MEPERIDINE HCL 25 MG/ML IJ SOLN: 6.2500 mg | INTRAMUSCULAR | Status: DC | PRN

## 2021-11-08 MED ORDER — SODIUM CHLORIDE 0.9 % IR SOLN
Status: DC | PRN
  Administered 2021-11-08: 24000 mL

## 2021-11-08 MED ORDER — FENTANYL CITRATE (PF) 100 MCG/2ML IJ SOLN
INTRAMUSCULAR | Status: DC | PRN
  Administered 2021-11-08 (×2): 50 ug via INTRAVENOUS
  Administered 2021-11-08: 25 ug via INTRAVENOUS
  Administered 2021-11-08: 100 ug via INTRAVENOUS
  Administered 2021-11-08: 50 ug via INTRAVENOUS
  Administered 2021-11-08: 25 ug via INTRAVENOUS
  Administered 2021-11-08: 50 ug via INTRAVENOUS
  Administered 2021-11-08 (×2): 25 ug via INTRAVENOUS

## 2021-11-08 MED ORDER — HYDROMORPHONE HCL 1 MG/ML IJ SOLN
INTRAMUSCULAR | Status: AC
  Filled 2021-11-08: qty 0.5

## 2021-11-08 MED ORDER — DEXMEDETOMIDINE (PRECEDEX) IN NS 20 MCG/5ML (4 MCG/ML) IV SYRINGE
PREFILLED_SYRINGE | INTRAVENOUS | Status: DC | PRN
  Administered 2021-11-08: 8 ug via INTRAVENOUS
  Administered 2021-11-08 (×3): 4 ug via INTRAVENOUS

## 2021-11-08 MED ORDER — ONDANSETRON HCL 4 MG/2ML IJ SOLN
INTRAMUSCULAR | Status: AC
  Filled 2021-11-08: qty 2

## 2021-11-08 MED ORDER — PROMETHAZINE HCL 25 MG/ML IJ SOLN: 6.2500 mg | INTRAMUSCULAR | Status: DC | PRN

## 2021-11-08 MED ORDER — TRANEXAMIC ACID-NACL 1000-0.7 MG/100ML-% IV SOLN
1000.0000 mg | INTRAVENOUS | Status: AC
  Administered 2021-11-08: 1000 mg via INTRAVENOUS

## 2021-11-08 MED ORDER — PROPOFOL 10 MG/ML IV BOLUS
INTRAVENOUS | Status: DC | PRN
  Administered 2021-11-08: 300 mg via INTRAVENOUS

## 2021-11-08 MED ORDER — LIDOCAINE 2% (20 MG/ML) 5 ML SYRINGE
INTRAMUSCULAR | Status: AC
  Filled 2021-11-08: qty 5

## 2021-11-08 MED ORDER — ONDANSETRON HCL 4 MG/2ML IJ SOLN
INTRAMUSCULAR | Status: DC | PRN
  Administered 2021-11-08: 4 mg via INTRAVENOUS

## 2021-11-08 MED ORDER — ASPIRIN EC 325 MG PO TBEC: 325.0000 mg | DELAYED_RELEASE_TABLET | Freq: Every day | ORAL | 0 refills | Status: DC

## 2021-11-08 MED ORDER — CEFAZOLIN SODIUM-DEXTROSE 2-4 GM/100ML-% IV SOLN
INTRAVENOUS | Status: AC
  Filled 2021-11-08: qty 100

## 2021-11-08 MED ORDER — VANCOMYCIN HCL 1000 MG IV SOLR
INTRAVENOUS | Status: DC | PRN
  Administered 2021-11-08 (×2): 1000 mg via TOPICAL

## 2021-11-08 MED ORDER — ACETAMINOPHEN 500 MG PO TABS
1000.0000 mg | ORAL_TABLET | Freq: Once | ORAL | Status: AC
  Administered 2021-11-08: 1000 mg via ORAL

## 2021-11-08 MED ORDER — OXYCODONE HCL 5 MG PO CAPS
5.0000 mg | ORAL_CAPSULE | ORAL | 0 refills | Status: DC | PRN
Start: 2021-11-08 — End: 2021-12-13

## 2021-11-08 MED ORDER — TRANEXAMIC ACID-NACL 1000-0.7 MG/100ML-% IV SOLN
INTRAVENOUS | Status: AC
  Filled 2021-11-08: qty 100

## 2021-11-08 MED ORDER — CEFAZOLIN SODIUM-DEXTROSE 2-4 GM/100ML-% IV SOLN
2.0000 g | INTRAVENOUS | Status: AC
  Administered 2021-11-08: 3 g via INTRAVENOUS

## 2021-11-08 MED ORDER — HYDROMORPHONE HCL 1 MG/ML IJ SOLN
0.2500 mg | INTRAMUSCULAR | Status: DC | PRN
  Administered 2021-11-08 (×3): 0.5 mg via INTRAVENOUS

## 2021-11-08 MED ORDER — MIDAZOLAM HCL 2 MG/2ML IJ SOLN: 0.5000 mg | Freq: Once | INTRAMUSCULAR | Status: DC | PRN

## 2021-11-08 MED ORDER — MIDAZOLAM HCL 2 MG/2ML IJ SOLN
INTRAMUSCULAR | Status: AC
  Filled 2021-11-08: qty 2

## 2021-11-08 MED ORDER — MIDAZOLAM HCL 2 MG/2ML IJ SOLN
2.0000 mg | Freq: Once | INTRAMUSCULAR | Status: AC
  Administered 2021-11-08: 2 mg via INTRAVENOUS

## 2021-11-08 MED ORDER — DEXAMETHASONE SODIUM PHOSPHATE 10 MG/ML IJ SOLN
INTRAMUSCULAR | Status: AC
  Filled 2021-11-08: qty 1

## 2021-11-08 MED ORDER — OXYCODONE HCL 5 MG/5ML PO SOLN: 5.0000 mg | Freq: Once | ORAL | Status: AC | PRN

## 2021-11-08 MED ORDER — ACETAMINOPHEN 500 MG PO TABS
ORAL_TABLET | ORAL | Status: AC
  Filled 2021-11-08: qty 2

## 2021-11-08 MED ORDER — ONDANSETRON HCL 4 MG/2ML IJ SOLN: INTRAMUSCULAR | Status: DC | PRN

## 2021-11-08 MED ORDER — VANCOMYCIN HCL 1000 MG IV SOLR
INTRAVENOUS | Status: AC
  Filled 2021-11-08: qty 20

## 2021-11-08 MED ORDER — ROPIVACAINE HCL 7.5 MG/ML IJ SOLN
INTRAMUSCULAR | Status: DC | PRN
  Administered 2021-11-08: 20 mL via PERINEURAL

## 2021-11-08 MED ORDER — FENTANYL CITRATE (PF) 100 MCG/2ML IJ SOLN
100.0000 ug | Freq: Once | INTRAMUSCULAR | Status: AC
  Administered 2021-11-08: 100 ug via INTRAVENOUS

## 2021-11-08 MED ORDER — LIDOCAINE HCL (CARDIAC) PF 100 MG/5ML IV SOSY
PREFILLED_SYRINGE | INTRAVENOUS | Status: DC | PRN
  Administered 2021-11-08: 60 mg via INTRAVENOUS

## 2021-11-08 MED ORDER — OXYCODONE HCL 5 MG PO TABS
ORAL_TABLET | ORAL | Status: AC
  Filled 2021-11-08: qty 1

## 2021-11-08 MED ORDER — LACTATED RINGERS IV SOLN: INTRAVENOUS | Status: DC

## 2021-11-08 MED ORDER — PROPOFOL 500 MG/50ML IV EMUL
INTRAVENOUS | Status: AC
  Filled 2021-11-08: qty 50

## 2021-11-08 MED ORDER — DEXAMETHASONE SODIUM PHOSPHATE 10 MG/ML IJ SOLN
INTRAMUSCULAR | Status: DC | PRN
  Administered 2021-11-08: 10 mg via INTRAVENOUS

## 2021-11-08 MED ORDER — ACETAMINOPHEN 500 MG PO TABS: 1000.0000 mg | ORAL_TABLET | Freq: Once | ORAL | Status: DC

## 2021-11-08 SURGICAL SUPPLY — 112 items
ANCH SUT SWLK 19.1X4.75 VT (Anchor) ×2 IMPLANT
ANCHOR BUTTON TIGHTROPE RN 14 (Anchor) ×1 IMPLANT
ANCHOR PEEK 4.75X19.1 SWLK C (Anchor) ×1 IMPLANT
APL PRP STRL LF DISP 70% ISPRP (MISCELLANEOUS) ×4
BLADE SHAVER BONE 5.0X13 (MISCELLANEOUS) IMPLANT
BLADE SHAVER TORPEDO 4X13 (MISCELLANEOUS) ×4 IMPLANT
BLADE SURG 15 STRL LF DISP TIS (BLADE) ×6 IMPLANT
BLADE SURG 15 STRL SS (BLADE) ×6
BNDG COHESIVE 4X5 TAN ST LF (GAUZE/BANDAGES/DRESSINGS) IMPLANT
BNDG ELASTIC 6X5.8 VLCR STR LF (GAUZE/BANDAGES/DRESSINGS) ×4 IMPLANT
BONE TUNNEL PLUG CANNULATED (MISCELLANEOUS) IMPLANT
CARTRIDGE SUT 2-0 NONSTITCH (Anchor) ×3 IMPLANT
CHLORAPREP W/TINT 26 (MISCELLANEOUS) ×5 IMPLANT
COOLER ICEMAN CLASSIC (MISCELLANEOUS) ×4 IMPLANT
COVER BACK TABLE 60X90IN (DRAPES) ×4 IMPLANT
CUFF TOURN SGL QUICK 34 (TOURNIQUET CUFF) ×3
CUFF TRNQT CYL 34X4.125X (TOURNIQUET CUFF) ×3 IMPLANT
CUFF TRNQT CYL 34X4X40X1 (TOURNIQUET CUFF) ×3 IMPLANT
CUTTER TENSIONER SUT 2-0 0 FBW (INSTRUMENTS) IMPLANT
DECANTER SPIKE VIAL GLASS SM (MISCELLANEOUS) IMPLANT
DISSECTOR 4.0MMX13CM CVD (MISCELLANEOUS) ×4 IMPLANT
DRAPE OEC MINIVIEW 54X84 (DRAPES) ×4 IMPLANT
DRAPE U-SHAPE 47X51 STRL (DRAPES) ×4 IMPLANT
DRAPE-T ARTHROSCOPY W/POUCH (DRAPES) ×4 IMPLANT
DRILL FLIPCUTTER III 6-12 (ORTHOPEDIC DISPOSABLE SUPPLIES) ×3 IMPLANT
DW OUTFLOW CASSETTE/TUBE SET (MISCELLANEOUS) ×4 IMPLANT
ELECT REM PT RETURN 9FT ADLT (ELECTROSURGICAL) ×3
ELECTRODE REM PT RTRN 9FT ADLT (ELECTROSURGICAL) ×3 IMPLANT
FASTFIX FLEX CRVD INSERT CANN (Miscellaneous) ×12 IMPLANT
FIBERSTICK 2 (SUTURE) ×6 IMPLANT
FLIPCUTTER III 6-12 AR-1204FF (ORTHOPEDIC DISPOSABLE SUPPLIES) ×3
GAUZE SPONGE 4X4 12PLY STRL (GAUZE/BANDAGES/DRESSINGS) ×8 IMPLANT
GAUZE XEROFORM 1X8 LF (GAUZE/BANDAGES/DRESSINGS) ×4 IMPLANT
GLOVE SRG 8 PF TXTR STRL LF DI (GLOVE) ×3 IMPLANT
GLOVE SURG ENC MOIS LTX SZ6 (GLOVE) ×8 IMPLANT
GLOVE SURG ENC MOIS LTX SZ7.5 (GLOVE) ×4 IMPLANT
GLOVE SURG LTX SZ8 (GLOVE) ×4 IMPLANT
GLOVE SURG POLYISO LF SZ7 (GLOVE) ×3 IMPLANT
GLOVE SURG UNDER POLY LF SZ6.5 (GLOVE) ×4 IMPLANT
GLOVE SURG UNDER POLY LF SZ7 (GLOVE) ×3 IMPLANT
GLOVE SURG UNDER POLY LF SZ8 (GLOVE) ×3
GOWN STRL REUS W/ TWL LRG LVL3 (GOWN DISPOSABLE) ×6 IMPLANT
GOWN STRL REUS W/ TWL XL LVL3 (GOWN DISPOSABLE) IMPLANT
GOWN STRL REUS W/TWL LRG LVL3 (GOWN DISPOSABLE) ×9
GOWN STRL REUS W/TWL XL LVL3 (GOWN DISPOSABLE) ×6 IMPLANT
HARVESTER TENDON QUADPRO 10 (ORTHOPEDIC DISPOSABLE SUPPLIES) ×1 IMPLANT
HARVESTER TENDON QUADPRO 11 (ORTHOPEDIC DISPOSABLE SUPPLIES) IMPLANT
HARVESTER TENDON QUADPRO 8 (ORTHOPEDIC DISPOSABLE SUPPLIES) IMPLANT
HARVESTER TENDON QUADPRO 9 (ORTHOPEDIC DISPOSABLE SUPPLIES) IMPLANT
IMMOBILIZER KNEE 20 (SOFTGOODS)
IMMOBILIZER KNEE 20 THIGH 36 (SOFTGOODS) IMPLANT
IMMOBILIZER KNEE 22 UNIV (SOFTGOODS) IMPLANT
IMMOBILIZER KNEE 24 THIGH 36 (MISCELLANEOUS) IMPLANT
IMMOBILIZER KNEE 24 UNIV (MISCELLANEOUS) ×3
IMP SYS 2ND FIX PEEK 4.75X19.1 (Miscellaneous) ×3 IMPLANT
IMPL SYS 2ND FX PEEK 4.75X19.1 (Miscellaneous) IMPLANT
IMPL TIGHTROP ABS ACL FIBERTG (Orthopedic Implant) IMPLANT
IMPL TIGHTROP FIBERTAG ACL (Orthopedic Implant) IMPLANT
IMPL TIGHTROPE ABS ACL FIBERTG (Orthopedic Implant) ×3 IMPLANT
IMPLANT TIGHTROPE FIBERTAG ACL (Orthopedic Implant) ×3 IMPLANT
INSERT FASTFIX FLEX CRVD CANN (Miscellaneous) IMPLANT
IV NS IRRIG 3000ML ARTHROMATIC (IV SOLUTION) ×18 IMPLANT
KIT ROOT REPAIR MEINISCAL PEEK (Anchor) IMPLANT
KIT TRANSTIBIAL (DISPOSABLE) ×4 IMPLANT
KNIFE GRAFT ACL 10MM 5952 (MISCELLANEOUS) ×1 IMPLANT
KNIFE GRAFT ACL 9MM (MISCELLANEOUS) IMPLANT
MANAGER SUT NOVOCUT (CUTTER) ×1 IMPLANT
MEINISCAL ROOT REPAIR KIT PEEK (Anchor) ×3 IMPLANT
NDL SCORPION MULTI FIRE (NEEDLE) IMPLANT
NDL SUT 2-0 SCORPION KNEE (NEEDLE) ×3 IMPLANT
NEEDLE SCORPION MULTI FIRE (NEEDLE) ×3 IMPLANT
NEEDLE SUT 2-0 SCORPION KNEE (NEEDLE) ×6 IMPLANT
NOVOSTICH PRO MENISCAL 2-0 (Miscellaneous) ×6 IMPLANT
NS IRRIG 1000ML POUR BTL (IV SOLUTION) ×4 IMPLANT
PACK ARTHROSCOPY DSU (CUSTOM PROCEDURE TRAY) ×4 IMPLANT
PACK BASIN DAY SURGERY FS (CUSTOM PROCEDURE TRAY) IMPLANT
PAD CAST 4YDX4 CTTN HI CHSV (CAST SUPPLIES) ×3 IMPLANT
PADDING CAST COTTON 4X4 STRL (CAST SUPPLIES) ×3
PENCIL SMOKE EVACUATOR (MISCELLANEOUS) ×4 IMPLANT
PORT APPOLLO RF 90DEGREE MULTI (SURGICAL WAND) ×4 IMPLANT
SHEET MEDIUM DRAPE 40X70 STRL (DRAPES) ×4 IMPLANT
SLEEVE SCD COMPRESS KNEE MED (STOCKING) ×4 IMPLANT
SPONGE T-LAP 4X18 ~~LOC~~+RFID (SPONGE) ×5 IMPLANT
SUT 0 FIBERLOOP 38 BLUE TPR ND (SUTURE) ×3
SUT 2 FIBERLOOP 20 STRT BLUE (SUTURE)
SUT ETHILON 3 0 PS 1 (SUTURE) ×10 IMPLANT
SUT FIBERWIRE #2 38 T-5 BLUE (SUTURE) ×3
SUT VIC AB 0 CT1 27 (SUTURE) ×9
SUT VIC AB 0 CT1 27XBRD ANBCTR (SUTURE) ×9 IMPLANT
SUT VIC AB 1 CT1 27 (SUTURE) ×9
SUT VIC AB 1 CT1 27XBRD ANBCTR (SUTURE) IMPLANT
SUT VIC AB 2-0 CT1 27 (SUTURE) ×3
SUT VIC AB 2-0 CT1 TAPERPNT 27 (SUTURE) IMPLANT
SUT VIC AB 2-0 PS2 27 (SUTURE) ×8 IMPLANT
SUT VIC AB 3-0 SH 27 (SUTURE)
SUT VIC AB 3-0 SH 27X BRD (SUTURE) IMPLANT
SUTURE 0 FIBERLP 38 BLU TPR ND (SUTURE) ×3 IMPLANT
SUTURE 2 FIBERLOOP 20 STRT BLU (SUTURE) IMPLANT
SUTURE FIBERWR #2 38 T-5 BLUE (SUTURE) IMPLANT
SUTURE TAPE 1.3 40 TPR END (SUTURE) IMPLANT
SUTURE TAPE 1.3 FIBERLOP 20 ST (SUTURE) IMPLANT
SUTURE TAPE TIGERLINK 1.3MM BL (SUTURE) IMPLANT
SUTURETAPE 1.3 40 TPR END (SUTURE) ×3
SUTURETAPE 1.3 FIBERLOOP 20 ST (SUTURE) ×6
SUTURETAPE TIGERLINK 1.3MM BL (SUTURE) ×3
SYSTEM NVSTCH PRO MENISCAL 2-0 (Miscellaneous) IMPLANT
TOWEL GREEN STERILE FF (TOWEL DISPOSABLE) ×12 IMPLANT
TUBE CONNECTING 20X1/4 (TUBING) ×4 IMPLANT
TUBE SUCTION HIGH CAP CLEAR NV (SUCTIONS) ×4 IMPLANT
TUBING ARTHROSCOPY IRRIG 16FT (MISCELLANEOUS) ×4 IMPLANT
WRAP KNEE MAXI GEL POST OP (GAUZE/BANDAGES/DRESSINGS) ×4 IMPLANT
YANKAUER SUCT BULB TIP NO VENT (SUCTIONS) ×1 IMPLANT

## 2021-11-08 NOTE — Progress Notes (Signed)
Assisted Dr. Carswell Jackson with left, ultrasound guided, adductor canal block. Side rails up, monitors on throughout procedure. See vital signs in flow sheet. Tolerated Procedure well.  

## 2021-11-08 NOTE — Anesthesia Postprocedure Evaluation (Signed)
Anesthesia Post Note  Patient: Jassiel Flye Yodice  Procedure(s) Performed: LEFT KNEE ANTERIOR CRUCIATE LIGAMENT RECONSTRUCTION WITH QUADRICEPS TENDON AUTOGRAFT (Left: Knee) LEFT LATERAL MENISCAL REPAIR (Knee)     Patient location during evaluation: PACU Anesthesia Type: General Level of consciousness: awake and alert, patient cooperative and oriented Pain management: pain level controlled Vital Signs Assessment: post-procedure vital signs reviewed and stable Respiratory status: spontaneous breathing, nonlabored ventilation and respiratory function stable Cardiovascular status: blood pressure returned to baseline and stable Postop Assessment: no apparent nausea or vomiting Anesthetic complications: no   No notable events documented.  Last Vitals:  Vitals:   11/08/21 1515 11/08/21 1530  BP: (!) 156/97   Pulse: (!) 117 (!) 116  Resp: 19 14  Temp:    SpO2: 95% 94%    Last Pain:  Vitals:   11/08/21 1515  TempSrc:   PainSc: 3                  Yacqub Baston,E. Maliah Pyles

## 2021-11-08 NOTE — Anesthesia Procedure Notes (Signed)
Procedure Name: LMA Insertion Date/Time: 11/08/2021 8:34 AM Performed by: Burna Cash, CRNA Pre-anesthesia Checklist: Patient identified, Emergency Drugs available, Suction available and Patient being monitored Patient Re-evaluated:Patient Re-evaluated prior to induction Oxygen Delivery Method: Circle system utilized Preoxygenation: Pre-oxygenation with 100% oxygen Induction Type: IV induction Ventilation: Mask ventilation without difficulty LMA: LMA inserted LMA Size: 5.0 Number of attempts: 1 Airway Equipment and Method: Bite block Placement Confirmation: positive ETCO2 Tube secured with: Tape Dental Injury: Teeth and Oropharynx as per pre-operative assessment

## 2021-11-08 NOTE — Op Note (Signed)
Date of Surgery: 11/08/2021  INDICATIONS: Mr. Malveaux is a 21 y.o.-year-old male with left complete ACL tear as well as lateral meniscal tear.  The risk and benefits of the procedure with discussed in detail and documented in the pre-operative evaluation.  PREOPERATIVE DIAGNOSIS: `1. Left anterior cruciate ligament complete tear 2. Left knee lateral meniscal tear, complex  POSTOPERATIVE DIAGNOSIS: Same.  PROCEDURE: 1. Left knee anterior cruciate ligament reconstruction with quadriceps autograft 2. Left knee lateral meniscal repair  SURGEON: Yevonne Pax MD  ASSISTANT: Raynelle Fanning, ATC; necessary for the timely completion of procedure and due to complexity of procedure.  ANESTHESIA:  general  IV FLUIDS AND URINE: See anesthesia record.  ANTIBIOTICS: Ancef 2g  ESTIMATED BLOOD LOSS: 25 mL.  IMPLANTS:  Implant Name Type Inv. Item Serial No. Manufacturer Lot No. LRB No. Used Action  CARTRIDGE SUT 2-0 NONSTITCH - [id number] Anchor CARTRIDGE SUT 2-0 NONSTITCH  [employer] [id number] Left 2 Implanted  Fastfix flex Crvd Insert Cannula    [employer] [id number] Left 1 Implanted  fast fix flex Curved inserter    [employer] [id number] [id number] Left 1 Implanted  Fast fix flex curved inserter    [employer] [id number] Left 1 Implanted  fast fix flex curved inserter    [employer] [id number] Left 1 Implanted  NOVOSTICH PRO MENISCAL 2-0 - [id number] Miscellaneous NOVOSTICH PRO MENISCAL 2-0  [employer] [id number] Left 1 Implanted  CARTRIDGE SUT 2-0 NONSTITCH - [id number] Anchor CARTRIDGE SUT 2-0 [facility] [employer] ENDOS[employer] [id number] Left 1 Implanted  IMPL TIGHTROPE ABS ACL FIBERTG - [id number] Orthopedic Implant IMPL TIGHTROPE ABS ACL FIBERTG  [employer] [id number] Left 1 Implanted  IMPLANT TIGHTROPE FIBERTAG ACL - [id number] Orthopedic Implant IMPLANT TIGHTROPE FIBERTAG ACL  [employer] [id number] Left 1 Implanted   MEINISCAL ROOT REPAIR KIT PEEK - [id number] Anchor MEINISCAL ROOT REPAIR KIT PEEK  [employer] [id number] Left 1 Implanted  IMP SYS 2ND FIX PEEK 4.75X19.1 - [id number] Miscellaneous IMP SYS 2ND FIX PEEK 4.75X19.1  [employer] [id number] Left 1 Implanted  Bruce Donath TIGHTROPE RN [id number] Anchor ANCHOR BUTTON TIGHTROPE RN 14  [employer] [place] [id number] Left 1 Implanted  ANCHOR PEEK 4.75X19.1 SWLK C - [id number] Anchor ANCHOR PEEK 4.75X19.1 SWLK C  [employer] [id number] Left 1 Implanted    DRAINS: None  CULTURES: None  COMPLICATIONS: none  DESCRIPTION OF PROCEDURE:  Examination under anesthesia: A careful examination under anesthesia was performed.  Knee ROM motion was: 0-135 Lachman: Grade 3b Pivot Shift: 2+ Posterior drawer: normal.   Varus stability in full extension: normal.   Varus stability in 30 degrees of flexion: normal.  Valgus stability in full extension: normal.   Valgus stability in 30 degrees of flexion: normal.  Posterolateral drawer: normal   Intra-operative findings: A thorough arthroscopic examination of the knee was performed.  The findings are: 1. Suprapatellar pouch: Normal 2. Undersurface of median ridge: Normal 3. Medial patellar facet: Normal 4. Lateral patellar facet: Normal 5. Trochlea: Normal 6. Lateral gutter/popliteus tendon: Normal 7. Hoffa's fat pad: Normal 8. Medial gutter/plica: Normal 9. ACL: Complete tear 10. PCL: Normal 11. Medial meniscus: Normal 12. Medial compartment cartilage: Normal 13. Lateral meniscus: Complex radial tear at the posterior horn with extension into the body 14. Lateral compartment cartilage: Normal   I identified the patient in the pre-operative holding area.  I marked the operative left knee with my initials. I reviewed the  risks and benefits of the proposed surgical intervention and the patient (and/or patient's guardian) wished to proceed.  Anesthesia was then performed with regional block.  The patient was transferred to  the operative suite and placed in the supine position with all bony prominences padded.     SCDs were placed on the non-operative lower extremity. Appropriate antibiotics was administered within 1 hour before incision. The operative extremity was then prepped and draped in standard fashion. A time out was performed confirming the correct extremity, correct patient and correct procedure. A tourniquet was placed but not inflated   Arthroscopy portals were marked and portals were established with an 11 blade.  A diagnostic arthroscopy was performed with findings above.  We began with the lateral meniscal repair.  The leg was placed in a physician of 4 position.  We initially had used 2 side to side repairs with the nova stitch as well as an all inside suture at the junction of the middle and posterior third of the meniscus and a bowel suture fashion.  Upon placing the leg back in normal position this is gapped open the repair.  As result the decision was made to perform a root style repair through a tibial tunnel.  The free and of the posterior horn was captured using #2 fiber loops twice.  An ACL guide was then used to drill up to a tibia tunnel with the flip cutter device. The tibial tunnel was directed out of the lateral tibia in order to prevent tunnel convergence.  A fiber stick was then passed through this tunnel and the suture was then used to shuttle the meniscal sutures.  These were shuttled and placed into a 4.75 swivel lock using a drill followed by a tap.  Following this there was good apposition of the meniscus and very stable.  The femoral and tibial ACL footprints were debrided of tissue and prepared for tunnel placement.     The quad tendon graft harvest was done next.  A 5-cm vertical incision was made over the quadriceps insertion. Care was taken not to extend proximally greater than 8cm to avoid disruption of muscle belly. The paratenon was resected. The Ar[employer] double blade 10 mm quad tendon  graft cutter was used to cut a strip of quadriceps tendon. A traction was held with an alice clamp and subsequently freed at the distal aspect.  This was captured using a fiber loop.  A quadriceps tendon harvester quad pro was then used a 70 mm graft.   The free quadriceps tendon graft was placed on the Graft Station. The Graft Prep Attachments were placed on the base and the SpeedWhip Rip-Stop technique was employed to secure the suture/tissue interface of the TightRopes with a Tutwiler. A TightRope was used on the quadriceps graft for femoral fixation and a TightRope ABS for tibial fixation.   The graft measured 9.5 mm on the femoral side and 10 mm on tibial side. The graft was tensioned for the next 20 minutes.    At this time the retrograde flip cutter was introduced into the lateral portal and placed at the anatomic footprint of the native ACL.  This was drilled through the lateral femur with care to stay low and to avoid the previous button.  A depth of 25 mm was then drilled.  A fiber stick was placed.   The tibial tunnel was created with an 10 mm  barrel reamer at 55 degree obliquity in the coronal plane, centered at the ACL  footprint as determined by the anterior horn of the lateral meniscus through an incision in the anteromedial tibia. The graft was then pulled into the knee with a traction suture. The ACL TightRope button was confirmed to be deep to the IT band using fluoroscopy.  The graft was then pulled into place with the white suture until parked in the femoral tunnel.     The graft was then affixed distally under tension with the knee in extension. The TightRope ABS Button was loaded onto the loop.  As this was tensioned the tag component of the tibial side of the graft had come off.  As result of the medial portal was subsequently extended and the graft was pulled out using a grasper out of the medial portal.  This was resutured with #2 FiberWire in a whipstitch fashion.  This  was then read shuttled through the tibial tunnel.  Because of this a an additional 4.75 mm swivel lock with an internal brace was placed onto the femoral side next to the femoral insertion of the graft.  The suture tape was woven through the graft and brought out the tibial tunnel.  The sutures in addition to the FiberWire connected to the graft were then placed into a 4.75 mm swivel lock on the tibia.  Following this Lachman was negative and quite stable.  The graft was probed and found to be taut at all angles of flexion.  Lachman examination improved to 1A.    Wounds were closed in layers with 0-vicryl suture for the medial patellar arthrotomy.  The quad was closed with #1 Vicryl to achieve a watertight closure.  The incisions were then closed in layers with 0-vicryl, 2-0 vicryl, and 3-0 nylon.   The quadriceps tendon harvest incision was closed with 0- Vicryl in the quad tendon, and and interrupted inverted 2-0 Vicryl in subcutaneous tissue and a 3-0 nylon in the skin.  The other incisions were closed superficially with 2-0 vicryl and 3-0 nylon.  Steristrips, Xeroform, gauze, and Tegaderm were placed the patient was placed in a knee brace locked in extension   Instrument, sponge, and needle counts were correct prior to wound closure and at the conclusion of the case.   The patient awoke from anesthesia without difficulty and was transferred to the PACU in stable condition.   Raynelle Fanning ATC was necessary for opening, closing, retracting, limb positioning and overall facilitation and timely completion of the procedure.     POSTOPERATIVE PLAN: The patient will be nonweightbearing for a total of 2 weeks.  Following that we will gradually progress his weightbearing as well as his range of motion with physical therapy.  He will be placed in knee immobilizer until physical therapy at which point he will be switched into his brace  Yevonne Pax, MD 2:04 PM

## 2021-11-08 NOTE — Brief Op Note (Signed)
° °  Brief Op Note ° °Date of Surgery: °11/08/2021 ° °Preoperative Diagnosis: °left anterior cruciate ligament tear, left lateral meniscal tear, ° °Postoperative Diagnosis: °same ° °Procedure: °Procedure(s): °LEFT KNEE ANTERIOR CRUCIATE LIGAMENT RECONSTRUCTION WITH QUADRICEPS TENDON AUTOGRAFT, LEFT LATERAL MENISCAL REPAIR ° °Implants: °Implant Name Type Inv. Item Serial No. Manufacturer Lot No. LRB No. Used Action  °CARTRIDGE SUT 2-0 NONSTITCH - [id number] Anchor CARTRIDGE SUT 2-0 NONSTITCH  [employer] [id number] Left 2 Implanted  °Fastfix flex Crvd Insert Cannula    [employer] [id number] Left 1 Implanted  °fast fix flex Curved inserter    [employer] [id number] Left 1 Implanted  °Fast fix flex curved inserter    [employer] [id number] Left 1 Implanted  °fast fix flex curved inserter    [employer] [id number] Left 1 Implanted  °NOVOSTICH PRO MENISCAL 2-0 - [id number] Miscellaneous NOVOSTICH PRO MENISCAL 2-0  [employer] [id number] Left 1 Implanted  °CARTRIDGE SUT 2-0 NONSTITCH - [id number] Anchor CARTRIDGE SUT 2-0 NONSTITCH  [employer] [id number] Left 1 Implanted  °IMPL TIGHTROPE ABS ACL FIBERTG - [id number] Orthopedic Implant IMPL TIGHTROPE ABS ACL FIBERTG  [employer] [id number] Left 1 Implanted  °IMPLANT TIGHTROPE FIBERTAG ACL - [id number] Orthopedic Implant IMPLANT TIGHTROPE FIBERTAG ACL  [employer] [id number] Left 1 Implanted  °MEINISCAL ROOT REPAIR KIT PEEK - [id number] Anchor MEINISCAL ROOT REPAIR KIT PEEK  [employer] [id number] Left 1 Implanted  °IMP SYS 2ND FIX PEEK 4.75X19.1 - [id number] Miscellaneous IMP SYS 2ND FIX PEEK 4.75X19.1  [employer] [id number] Left 1 Implanted  °ANCHOR BUTTON TIGHTROPE RN 14 - [id number] Anchor ANCHOR BUTTON TIGHTROPE RN 14  [employer] [id number] Left 1 Implanted  °ANCHOR PEEK 4.75X19.1 SWLK C - [id number] Anchor ANCHOR PEEK 4.75X19.1 SWLK C  [employer] [id number] Left 1 Implanted   ° ° °Surgeons: °Surgeon(s): °Bokshan, Steven, MD ° °Anesthesia: °Regional ° ° ° °Estimated Blood Loss: °See anesthesia record ° °Complications: °None ° °Condition to PACU: °Stable ° °Steven L Bokshan, MD °11/08/2021 °1:48 PM ° °

## 2021-11-08 NOTE — Discharge Instructions (Addendum)
Discharge Instructions    Attending Surgeon: Huel Cote, MD Office Phone Number: [phone]   Diagnosis and Procedures:    Surgeries Performed: Left knee ACL reconstruction with lateral meniscal repair  Discharge Plan:    Diet: Resume usual diet. Begin with light or bland foods.  Drink plenty of fluids.  Activity:  Non-weightbearing, utilizing crutches, for 2 weeks postop. Please keep your brace locked until follow-up. You are advised to go home directly from the hospital or surgical center. Restrict your activities.  GENERAL INSTRUCTIONS: 1.  Keep your surgical site elevated above your heart for at least 5-7 days or longer to prevent swelling. This will improve your comfort and your overall recovery following surgery.     2. Please call Dr. Serena Croissant office at [phone] with questions Monday-Friday during business hours. If no one answers, please leave a message and someone should get back to the patient within 24 hours. For emergencies please call 911 or proceed to the emergency room.   3. Patient to notify surgical team if experiences any of the following: Bowel/Bladder dysfunction, uncontrolled pain, nerve/muscle weakness, incision with increased drainage or redness, nausea/vomiting and Fever greater than 101.0 F.  Be alert for signs of infection including redness, streaking, odor, fever or chills. Be alert for excessive pain or bleeding and notify your surgeon immediately.  WOUND INSTRUCTIONS:   Leave your dressing/cast/splint in place until your post operative visit.  Keep it clean and dry.  Always keep the incision clean and dry until the staples/sutures are removed. If there is no drainage from the incision you should keep it open to air. If there is drainage from the incision you must keep it covered at all times until the drainage stops  Do not soak in a bath tub, hot tub, pool, lake or other body of water until 21 days after your surgery and your incision  is completely dry and healed.  If you have removable sutures (or staples) they must be removed 10-14 days (unless otherwise instructed) from the day of your surgery.     1)  Elevate the extremity as much as possible.  2)  Keep the dressing clean and dry.  3)  Please call us if the dressing becomes wet or dirty.  4)  If you are experiencing worsening pain or worsening swelling, please call.     MEDICATIONS: Resume all previous home medications at the previous prescribed dose and frequency unless otherwise noted Start taking the  pain medications on an as-needed basis as prescribed  Please taper down pain medication over the next week following surgery.  Ideally you should not require a refill of any narcotic pain medication.  Take pain medication with food to minimize nausea. In addition to the prescribed pain medication, you may take over-the-counter pain relievers such as Tylenol.  Do NOT take additional tylenol if your pain medication already has tylenol in it.  Aspirin 325mg  daily for four weeks.      FOLLOWUP INSTRUCTIONS: 1. Follow up at the Physical Therapy Clinic 3-4 days following surgery. This appointment should be scheduled unless other arrangements have been made.The Physical Therapy scheduling number is [phone] if an appointment has not already been arranged.  2. Contact Dr. [phone] office during office hours at [phone] or the practice after hours line at [phone] for non-emergencies. For medical emergencies call 911.   Discharge Location: Home     Post Anesthesia Home Care Instructions  Activity: Get plenty of rest for the  remainder of the day. A responsible individual must stay with you for 24 hours following the procedure.  For the next 24 hours, DO NOT: -Drive a car -Advertising copywriter -Drink alcoholic beverages -Take any medication unless instructed by your physician -Make any legal decisions or sign important papers.  Meals: Start with  liquid foods such as gelatin or soup. Progress to regular foods as tolerated. Avoid greasy, spicy, heavy foods. If nausea and/or vomiting occur, drink only clear liquids until the nausea and/or vomiting subsides. Call your physician if vomiting continues.  Special Instructions/Symptoms: Your throat may feel dry or sore from the anesthesia or the breathing tube placed in your throat during surgery. If this causes discomfort, gargle with warm salt water. The discomfort should disappear within 24 hours.  If you had a scopolamine patch placed behind your ear for the management of post- operative nausea and/or vomiting:  1. The medication in the patch is effective for 72 hours, after which it should be removed.  Wrap patch in a tissue and discard in the trash. Wash hands thoroughly with soap and water. 2. You may remove the patch earlier than 72 hours if you experience unpleasant side effects which may include dry mouth, dizziness or visual disturbances. 3. Avoid touching the patch. Wash your hands with soap and water after contact with the patch.      Regional Anesthesia Blocks  1. Numbness or the inability to move the "blocked" extremity may last from 3-48 hours after placement. The length of time depends on the medication injected and your individual response to the medication. If the numbness is not going away after 48 hours, call your surgeon.  2. The extremity that is blocked will need to be protected until the numbness is gone and the  Strength has returned. Because you cannot feel it, you will need to take extra care to avoid injury. Because it may be weak, you may have difficulty moving it or using it. You may not know what position it is in without looking at it while the block is in effect.  3. For blocks in the legs and feet, returning to weight bearing and walking needs to be done carefully. You will need to wait until the numbness is entirely gone and the strength has returned. You should  be able to move your leg and foot normally before you try and bear weight or walk. You will need someone to be with you when you first try to ensure you do not fall and possibly risk injury.  4. Bruising and tenderness at the needle site are common side effects and will resolve in a few days.  5. Persistent numbness or new problems with movement should be communicated to the surgeon or the [facility] [phone] [facility] [phone]).

## 2021-11-08 NOTE — Anesthesia Procedure Notes (Signed)
Anesthesia Regional Block: Adductor canal block   Pre-Anesthetic Checklist: , timeout performed,  Correct Patient, Correct Site, Correct Laterality,  Correct Procedure, Correct Position, site marked,  Risks and benefits discussed,  Surgical consent,  Pre-op evaluation,  At surgeon's request and post-op pain management  Laterality: Left and Lower  Prep: chloraprep       Needles:  Injection technique: Single-shot  Needle Type: Echogenic Needle     Needle Length: 9cm  Needle Gauge: 21     Additional Needles:   Procedures:,,,, ultrasound used (permanent image in chart),,    Narrative:  Start time: 11/08/2021 6:56 AM End time: 11/08/2021 7:02 AM Injection made incrementally with aspirations every 5 mL.  Performed by: Personally  Anesthesiologist: Jairo Ben, MD  Additional Notes: Pt identified in Holding room.  Monitors applied. Working IV access confirmed. Sterile prep L thigh.  #21ga ECHOgenic Arrow block needle into adductor canal with US guidance.  20cc 0.75% Ropivacaine injected incrementally after negative test dose.  Patient asymptomatic, VSS, no heme aspirated, tolerated well.   Sandford Craze, MD

## 2021-11-08 NOTE — Transfer of Care (Signed)
Immediate Anesthesia Transfer of Care Note  Patient: Dustin Parks  Procedure(s) Performed: LEFT KNEE ANTERIOR CRUCIATE LIGAMENT RECONSTRUCTION WITH QUADRICEPS TENDON AUTOGRAFT (Left: Knee) LEFT LATERAL MENISCAL REPAIR (Knee)  Patient Location: PACU  Anesthesia Type:GA combined with regional for post-op pain  Level of Consciousness: sedated  Airway & Oxygen Therapy: Patient Spontanous Breathing and Patient connected to face mask oxygen  Post-op Assessment: Report given to RN and Post -op Vital signs reviewed and stable  Post vital signs: Reviewed and stable  Last Vitals:  Vitals Value Taken Time  BP 137/88 11/08/21 1357  Temp    Pulse 104 11/08/21 1358  Resp 18 11/08/21 1358  SpO2 95 % 11/08/21 1358  Vitals shown include unvalidated device data.  Last Pain:  Vitals:   11/08/21 0638  TempSrc: Oral  PainSc: 0-No pain      Patients Stated Pain Goal: 3 (11/08/21 6962)  Complications: No notable events documented.

## 2021-11-08 NOTE — Interval H&P Note (Signed)
History and Physical Interval Note:  11/08/2021 7:32 AM  Dustin Parks  has presented today for surgery, with the diagnosis of left anterior cruciate ligament tear, left lateral meniscal tear,.  The various methods of treatment have been discussed with the patient and family. After consideration of risks, benefits and other options for treatment, the patient has consented to  Procedure(s): LEFT KNEE ANTERIOR CRUCIATE LIGAMENT RECONSTRUCTION WITH QUADRICEPS TENDON AUTOGRAFT, LEFT LATERAL MENISCAL REPAIR (Left) as a surgical intervention.  The patient's history has been reviewed, patient examined, no change in status, stable for surgery.  I have reviewed the patient's chart and labs.  Questions were answered to the patient's satisfaction.     Huel Cote

## 2021-11-09 ENCOUNTER — Telehealth: Payer: Self-pay | Admitting: Orthopaedic Surgery

## 2021-11-09 ENCOUNTER — Encounter (HOSPITAL_BASED_OUTPATIENT_CLINIC_OR_DEPARTMENT_OTHER): Payer: Self-pay | Admitting: Orthopaedic Surgery

## 2021-11-09 NOTE — Telephone Encounter (Signed)
Patient  request a call back regarding medication ( Asprin) he received when he picked up his medication from the pharmacy. Pt states he had no idea he was getting the aspirin & Oxycodone because he did not get any instructions on when & how he should take the Aspirin.    Pt's call back # [phone]

## 2021-11-09 NOTE — Telephone Encounter (Signed)
Called pt back and informed him Aspirin is to be taken 30 days post surgery, Tylenol and ibuprofen can be taken intermittently and oxycodone is for breakthrough pain

## 2021-11-12 NOTE — Therapy (Signed)
OUTPATIENT PHYSICAL THERAPY LOWER EXTREMITY EVALUATION   Patient Name: Dustin Parks MRN: [id number] DOB:November 24, 2000, 21 y.o., male Today's Date: 11/13/2021   PT End of Session - 11/13/21 1746     Visit Number 1    Number of Visits 32    Date for PT Re-Evaluation 02/05/22    PT Start Time 1153    PT Stop Time 1303    PT Time Calculation (min) 70 min    Equipment Utilized During Treatment Other (comment)   FWW   Activity Tolerance Patient tolerated treatment well    Behavior During Therapy WFL for tasks assessed/performed             Past Medical History:  Diagnosis Date   Asthma    out grown now    Past Surgical History:  Procedure Laterality Date   KNEE ARTHROSCOPY WITH ANTERIOR CRUCIATE LIGAMENT (ACL) REPAIR WITH HAMSTRING GRAFT Left 11/08/2021   Procedure: LEFT KNEE ANTERIOR CRUCIATE LIGAMENT RECONSTRUCTION WITH QUADRICEPS TENDON AUTOGRAFT;  Surgeon: Huel CoteBokshan, Steven, MD;  Location: [facility];  Service: Orthopedics;  Laterality: Left;   KNEE ARTHROSCOPY WITH MENISCAL REPAIR  11/08/2021   Procedure: LEFT LATERAL MENISCAL REPAIR;  Surgeon: Huel CoteBokshan, Steven, MD;  Location: [facility];  Service: Orthopedics;;   WISDOM TOOTH EXTRACTION  2022   Patient Active Problem List   Diagnosis Date Noted   Left anterior cruciate ligament tear    Complex tear of lateral meniscus of left knee as current injury    Somatic dysfunction of spine, cervical 10/05/2021   Adjustment disorder 09/26/2021   Encounter for vasectomy counseling 09/26/2021   Neck pain 09/26/2021   History of orchitis 02/08/2021    PCP: Mliss SaxKremer, William Alfred, MD  REFERRING PROVIDER: Huel CoteBokshan, Steven, MD  REFERRING DIAG: [phone] (ICD-10-CM) - Rupture of anterior cruciate ligament of left knee, initial encounter       S83.272A (ICD-10-CM) - Complex tear of lateral meniscus of left knee as current injury, initial encounter      Z98.890 (ICD-10-CM) - S/P left knee arthroscopy    THERAPY DIAG:  Left knee pain, unspecified chronicity  Stiffness of left knee, not elsewhere classified  Muscle weakness (generalized)  Difficulty in walking, not elsewhere classified  ONSET DATE: Injury 10/09/2021 / Surgery 11/08/2021  SUBJECTIVE:   SUBJECTIVE STATEMENT: -Patient was dancing at a work party when he jumped and twisted his knee. He suffered a left ACL tear, lateral meniscal tear, and a left fibular head fx.  Pt received 5 visits of prehab and progressed well.  Pt underwent Left knee ACL ligament reconstruction with quadriceps autograft and lateral meniscal repair on 11/08/2021.  -Pt is 5 days s/p Left knee ACL ligament reconstruction with quadriceps autograft and lateral meniscal repair.  Op note indicated patient will be nonweightbearing for a total of 2 weeks.  Following that we will gradually progress his weightbearing as well as his range of motion with physical therapy.  He will be placed in knee immobilizer until physical therapy at which point he will be switched into his brace.  -Pt is weaning off of pain meds.  He is not taking oxycodone during the day just tylenol and takes oxycodone at night.  Pt reports sleeping is difficult.  Pt is limited with his ADLs/IADLs and functional mobility skills including ambulation, tranfers, and stairs.  He is limited with standing duration and standing activities.  Pt has difficulty with sit/stand, car, and toilet transfers.  Pt is unable to dance.  PERTINENT HISTORY:  11/08/2021 Left knee ACL ligament reconstruction with quadriceps autograft and lateral meniscal repair, NWB x 2 weeks.  Allergy to certain adhesives including tegaderm per pt report   PAIN:  Are you having pain? Not currently NPRS scale: 0/10 current, 7/10 worst pain Pain location: L knee   PRECAUTIONS: Other: per Dr. Serena Croissant ACL reconstruction with meniscal repair  WEIGHT BEARING RESTRICTIONS Yes NWB  FALLS:  Has patient fallen in last 6 months? Yes, Number of  falls: 1 (injury)  LIVING ENVIRONMENT: Lives with: girlfriend typically; but mother currently due to surgery. Lives in: House/apartment; 1 story Stairs: No; 1 step to enter without rail Has following equipment at home: Crutches and FWW; elevated toilet seat  OCCUPATION: in culinary school    PLOF: Independent; Pt was able to perform all of his ADLs/IADLs and functional mobility skills without difficulty or limitations.  Pt was able to perform work activities.  Pt ambulated without an AD with good stability without difficulty.   PATIENT GOALS improved stability with leaning, to have the ability to run, to be able to bowl.  Return to PLOF.    OBJECTIVE:   DIAGNOSTIC FINDINGS:  MRI prior to surgery in early December:  (In Epic) IMPRESSION: 1. Complete tear of the ACL. 2. Large radial tear of the lateral meniscus posterior horn with displaced meniscal tissue extending into the intercondylar notch. Additional horizontal tear of the body with small intrameniscal cyst. 3. Osteochondral impaction fracture of the lateral femoral condyle. No displaced cartilage fragment. 4. Tiny acute nondisplaced fracture at the tip of the fibular head. 5. Proximal fibular collateral ligament sprain. 6. Large joint effusion.   PATIENT SURVEYS:  FOTO 26 with a goal of 46  COGNITION:  Overall cognitive status: Within functional limits for tasks assessed      OBSERVATION:  Pt wearing 2 ACE bandages and is in a KI.  Pt states his mom is a [employer] fundraiser and she already changed his dressing.  Pt has stitches in portals and incision which are intact.  Pt has no drainage or signs of infection.  Gauze is over portals and incision with an ABD pad over area.  PT did not apply tegaderm due to pt stating he is allergic to certain adhesives including tegaderm.  Pt has swelling in L knee and atrophy in L quad and calf.     GIRTH MEASUREMENTS: R:  43.3 cm, L:  not assessed    LE AROM/PROM:  A/PROM Right 11/13/2021  Left 11/13/2021  Hip flexion    Hip extension    Hip abduction    Hip adduction    Hip internal rotation    Hip external rotation    Knee flexion 134 PROM:  37 deg  Knee extension 8 deg of hyperextension Lacking 10 deg of extension/lacking 7 deg of extension  Ankle dorsiflexion    Ankle plantarflexion    Ankle inversion    Ankle eversion     (Blank rows = not tested)   FUNCTIONAL MOBILITY: Pt required assistance with lifting L LE to perform bed mobility/sit to supine transfer.  STRENGTH: Pt has a fair (-) quad set on L and a normal quad set on R    GAIT: Comments: Pt ambulated with TTWB'ing into the clinic with FWW and was instructed in performing NWB'ing precautions.  Pt then ambulated with NWB'ing precautions with FWW with a hop to gait pattern.      TODAY'S TREATMENT: -PT applied Donjoy Brace to pt and spent time adjusting brace to the  correct position.  PT educated pt in correct positioning of the brace and how to adjusted.  PT also educated pt with dressing care.    -Pt performed quad sets x10-12 reps with 5 sec hold with and without towel roll under knee.  Instructed pt he can perform with towel roll under knee for comfort.  Pt performed ankle pumps x 20 reps.  Gave pt a HEP handout. -See below for pt education provided.    PATIENT EDUCATION:  Education details:  Educated pt in post op protocol and restrictions, ROM expectations, POC, dx, objective findings, and relevant anatomy.  Educated pt in correct brace position.  Pt received a HEP handout and was educated in correct form and appropriate frequency.  Instructed pt in using ice.   Person educated: Patient and father Education method: Explanation, Demonstration, Tactile cues, Verbal cues, and Handouts Education comprehension: verbalized understanding, returned demonstration, verbal cues required, tactile cues required, and needs further education   HOME EXERCISE PROGRAM: Access Code: [email or web address] URL:  [email or web address] Date: 11/13/2021 Prepared by: Aaron Edelmanrey Janthony Holleman  Exercises Supine Quadricep Sets - 2 x daily - 7 x weekly - 2 sets - 10 reps - 5 seconds hold ANKLE PUMPS - 3 x daily - 7 x weekly - 3 sets - 10 reps   ASSESSMENT:  CLINICAL IMPRESSION: Patient is a 21 y.o. male 5 days s/p Left knee ACL ligament reconstruction with quadriceps autograft and lateral meniscal repair presenting to the clinic with expected post op findings of L knee pain, limited ROM in L knee, muscle weakness in L LE, and difficulty in walking.  Pt is limited with his ADLs/IADLs and functional mobility skills including ambulation, tranfers, and stairs.  He is limited with standing duration and standing activities.  Pt is using a FWW and is NWB'ing.  Pt has difficulty with sit/stand, car, and toilet transfers.  Pt is unable to dance.  Pt will benefit from skilled PT services per protocol to address impairments and to assist in restoring PLOF.     Objective impairments include Abnormal gait, decreased activity tolerance, decreased balance, decreased mobility, difficulty walking, decreased ROM, decreased strength, hypomobility, increased edema, impaired flexibility, and pain. These impairments are limiting patient from cleaning, community activity, driving, meal prep, occupation, laundry, shopping, school, and ambulation .    REHAB POTENTIAL: Good  CLINICAL DECISION MAKING: Stable/uncomplicated  EVALUATION COMPLEXITY: Low   GOALS:   SHORT TERM GOALS:  STG Name Target Date Goal status  1 Pt will be independent and compliant with HEP for improved pain, strength, and ROM.  Baseline:  12/11/2021 INITIAL  2 Pt will demo a good quad set and perform supine SLRs independently with brace for improved quad strength. Baseline:  11/27/2021 INITIAL  3 Pt will demo improved improved L knee extension PROM/AROM to 0/3 and flexion PROM to 60 deg for improved mobility and stiffness. Baseline: 12/04/2021 INITIAL  4  Pt will demo improved improved L knee extension AROM to 0 deg and  flexion AAROM/PROM to 80/90 deg for improved mobility and stiffness. Baseline: 12/18/2021 INITIAL  5 Pt will demo L knee AROM to be 0 - 120 deg for improved stiffness and performance of functional mobility.   Baseline: 01/22/2022 INITIAL  6 Pt will progress Wb'ing with gait per MD orders and protocol without adverse effects.  Baseline: 12/18/2021 INITIAL  7 Pt will progress with closed chain exercises per protocol without adverse effects for improved functional strength and stability.  Baseline: 12/25/2021 INITIAL  8 Pt will score  0-1 on the lateral step down test on a 4 inch step for improved quad eccentric control.   01/22/2022  INITIAL  9 Pt will score 0-1 on the lateral step down test on a 6 inch step for improved quad eccentric control and performance of stairs.   02/06/2022 INITIAL  10 Pt will ambulate with a normalized heel to toe gait without limping without AD 01/22/2022 INITIAL   LONG TERM GOALS:   LTG Name Target Date Goal status  1 Pt will be able to perform his ADLs/IADLs and normal functional mobility skills without significant difficulty or pain.  Baseline: 03/05/2022  INITIAL  2 Pt will ambulate extended community distance without an AD without increased pain or difficulty.  Baseline: 02/19/2022 INITIAL  3 Pt will be able to perform his normal daily transfers without difficulty.  Baseline: 02/05/2022 INITIAL  4 Pt will demo good squatting form, symmetrical Wb'ing, and good knee alignment without significant pain for improved functional strength and tolerance with IADLs.  Baseline: 02/19/2022 INITIAL  5 Pt will be able to perform stairs with a reciprocal gait without the rail with good control.  Baseline: 03/05/2022 INITIAL  6 Pt will be able to perform all of his school activities and work activities without significant pain or difficulty.   Baseline: 02/19/2022 INITIAL  7 Pt will demo 5/5 L hip and knee strength for  improved tolerance with and performance of functional mobility skills and to assist in returning to PLOF.   Baseline: 03/05/2022 INITIAL   PLAN: PT FREQUENCY: 2x/week  PT DURATION: other: 14-16 weeks  PLANNED INTERVENTIONS: Therapeutic exercises, Therapeutic activity, Neuro Muscular re-education, Balance training, Gait training, Patient/Family education, Joint mobilization, Stair training, DME instructions, Aquatic Therapy, Electrical stimulation, Cryotherapy, Moist heat, Taping, and Manual therapy  PLAN FOR NEXT SESSION: Cont per Dr. Serena Croissant ACL reconstruction with meniscal repair.  Pt currently NWBing.  Assess performance of supine SLR in brace next visit.  Allergy to certain adhesives including tegaderm.    Audie Clear III PT, DPT 11/13/21 9:19 PM

## 2021-11-13 ENCOUNTER — Ambulatory Visit (HOSPITAL_BASED_OUTPATIENT_CLINIC_OR_DEPARTMENT_OTHER): Payer: No Typology Code available for payment source | Attending: Orthopaedic Surgery | Admitting: Physical Therapy

## 2021-11-13 ENCOUNTER — Other Ambulatory Visit: Payer: Self-pay

## 2021-11-13 ENCOUNTER — Encounter (HOSPITAL_BASED_OUTPATIENT_CLINIC_OR_DEPARTMENT_OTHER): Payer: Self-pay | Admitting: Physical Therapy

## 2021-11-13 DIAGNOSIS — M6281 Muscle weakness (generalized): Secondary | ICD-10-CM | POA: Insufficient documentation

## 2021-11-13 DIAGNOSIS — M25562 Pain in left knee: Secondary | ICD-10-CM | POA: Insufficient documentation

## 2021-11-13 DIAGNOSIS — M25662 Stiffness of left knee, not elsewhere classified: Secondary | ICD-10-CM | POA: Diagnosis present

## 2021-11-13 DIAGNOSIS — R262 Difficulty in walking, not elsewhere classified: Secondary | ICD-10-CM | POA: Diagnosis present

## 2021-11-13 DIAGNOSIS — S83512A Sprain of anterior cruciate ligament of left knee, initial encounter: Secondary | ICD-10-CM | POA: Insufficient documentation

## 2021-11-14 ENCOUNTER — Other Ambulatory Visit (HOSPITAL_BASED_OUTPATIENT_CLINIC_OR_DEPARTMENT_OTHER): Payer: Self-pay | Admitting: Orthopaedic Surgery

## 2021-11-14 DIAGNOSIS — S83512A Sprain of anterior cruciate ligament of left knee, initial encounter: Secondary | ICD-10-CM

## 2021-11-15 ENCOUNTER — Encounter (HOSPITAL_BASED_OUTPATIENT_CLINIC_OR_DEPARTMENT_OTHER): Payer: No Typology Code available for payment source | Admitting: Physical Therapy

## 2021-11-17 ENCOUNTER — Ambulatory Visit (HOSPITAL_BASED_OUTPATIENT_CLINIC_OR_DEPARTMENT_OTHER): Payer: No Typology Code available for payment source | Admitting: Physical Therapy

## 2021-11-17 ENCOUNTER — Other Ambulatory Visit: Payer: Self-pay

## 2021-11-17 DIAGNOSIS — M25562 Pain in left knee: Secondary | ICD-10-CM

## 2021-11-17 DIAGNOSIS — R262 Difficulty in walking, not elsewhere classified: Secondary | ICD-10-CM

## 2021-11-17 DIAGNOSIS — M25662 Stiffness of left knee, not elsewhere classified: Secondary | ICD-10-CM

## 2021-11-17 DIAGNOSIS — M6281 Muscle weakness (generalized): Secondary | ICD-10-CM

## 2021-11-17 NOTE — Therapy (Signed)
OUTPATIENT PHYSICAL THERAPY TREATMENT NOTE   Patient Name: RAMESES OU MRN: [id number] DOB:[birth year], 21 y.o., male Today's Date: 11/17/2021  PCP: Mliss Sax, MD REFERRING PROVIDER: Dr Maricela Bo    PT End of Session - 11/17/21 1127     Visit Number 2    Number of Visits 32    Date for PT Re-Evaluation 02/05/22    PT Start Time 0845    PT Stop Time 0928    PT Time Calculation (min) 43 min    Activity Tolerance Patient tolerated treatment well    Behavior During Therapy [facility] for tasks assessed/performed             Past Medical History:  Diagnosis Date   Asthma    out grown now    Past Surgical History:  Procedure Laterality Date   KNEE ARTHROSCOPY WITH ANTERIOR CRUCIATE LIGAMENT (ACL) REPAIR WITH HAMSTRING GRAFT Left 11/08/2021   Procedure: LEFT KNEE ANTERIOR CRUCIATE LIGAMENT RECONSTRUCTION WITH QUADRICEPS TENDON AUTOGRAFT;  Surgeon: Huel Cote, MD;  Location: [facility];  Service: Orthopedics;  Laterality: Left;   KNEE ARTHROSCOPY WITH MENISCAL REPAIR  11/08/2021   Procedure: LEFT LATERAL MENISCAL REPAIR;  Surgeon: Huel Cote, MD;  Location: [facility];  Service: Orthopedics;;   WISDOM TOOTH EXTRACTION  2022   Patient Active Problem List   Diagnosis Date Noted   Left anterior cruciate ligament tear    Complex tear of lateral meniscus of left knee as current injury    Somatic dysfunction of spine, cervical 10/05/2021   Adjustment disorder 09/26/2021   Encounter for vasectomy counseling 09/26/2021   Neck pain 09/26/2021   History of orchitis 02/08/2021     PCP: Mliss Sax, MD   REFERRING PROVIDER: Huel Cote, MD   REFERRING DIAG: [phone] (ICD-10-CM) - Rupture of anterior cruciate ligament of left knee, initial encounter       S83.272A (ICD-10-CM) - Complex tear of lateral meniscus of left knee as current injury, initial encounter      Z98.890 (ICD-10-CM) - S/P left knee arthroscopy     THERAPY DIAG:  Left knee pain, unspecified chronicity   Stiffness of left knee, not elsewhere classified   Muscle weakness (generalized)   Difficulty in walking, not elsewhere classified   ONSET DATE: Injury 10/09/2021 / Surgery 11/08/2021   SUBJECTIVE:    SUBJECTIVE STATEMENT: Patient has been working hard on his extension. He reports no real pain today coming . His brace has been turning on him. He is not sure it is fit right.   PERTINENT HISTORY: 11/08/2021 Left knee ACL ligament reconstruction with quadriceps autograft and lateral meniscal repair, NWB x 2 weeks.  Allergy to certain adhesives including tegaderm per pt report    PAIN:  Are you having pain? Not currently NPRS scale: 0/10 current, 7/10 worst pain 1/13 Pain location: L knee     PRECAUTIONS: Other: per Dr. Serena Croissant ACL reconstruction with meniscal repair   WEIGHT BEARING RESTRICTIONS Yes NWB   FALLS:  Has patient fallen in last 6 months? Yes, Number of falls: 1 (injury)   LIVING ENVIRONMENT: Lives with: girlfriend typically; but mother currently due to surgery. Lives in: House/apartment; 1 story Stairs: No; 1 step to enter without rail Has following equipment at home: Crutches and FWW; elevated toilet seat   OCCUPATION: in culinary school     PLOF: Independent; Pt was able to perform all of his ADLs/IADLs and functional mobility skills without difficulty or limitations.  Pt was  able to perform work activities.  Pt ambulated without an AD with good stability without difficulty.    PATIENT GOALS improved stability with leaning, to have the ability to run, to be able to bowl.  Return to PLOF.      OBJECTIVE:    LE AROM/PROM:    Total arc 4 -67     (Blank rows = not tested)            TODAY'S TREATMENT:  1/3:  Adjusted don -joy brace. He demonstrated a better fit and  Manual: Knee flexion and extension stretching  Quad set 2x10 5 sec hold  Assisted SLR 3x10 reviewed with patients mother how  she can assist as well.  Self stretch flexion 5x10 sec hold  [place] E-stim 10/20 18.5 before he felt a contraction. With ice 10 min with quad set '   Eval: -PT applied Donjoy Brace to pt and spent time adjusting brace to the correct position.  PT educated pt in correct positioning of the brace and how to adjusted.  PT also educated pt with dressing care.    -Pt performed quad sets x10-12 reps with 5 sec hold with and without towel roll under knee.  Instructed pt he can perform with towel roll under knee for comfort.  Pt performed ankle pumps x 20 reps.  Gave pt a HEP handout. -See below for pt education provided.      PATIENT EDUCATION:  Education details:  Educated pt in post op protocol and restrictions, ROM expectations, POC, dx, objective findings, and relevant anatomy.  Educated pt in correct brace position.  Pt received a HEP handout and was educated in correct form and appropriate frequency.  Instructed pt in using ice.   Person educated: Patient and father Education method: Explanation, Demonstration, Tactile cues, Verbal cues, and Handouts Education comprehension: verbalized understanding, returned demonstration, verbal cues required, tactile cues required, and needs further education     HOME EXERCISE PROGRAM: Access Code: [email or web address] URL: [email or web address] Date: 11/13/2021 Prepared by: Aaron Edelmanrey Harrison   Exercises Supine Quadricep Sets - 2 x daily - 7 x weekly - 2 sets - 10 reps - 5 seconds hold ANKLE PUMPS - 3 x daily - 7 x weekly - 3 sets - 10 reps     ASSESSMENT:   CLINICAL IMPRESSION: The patients extension has improved significantly since the last visit. He had mild pain with flexion stretching. That has improved as well but still limited. He was advised to listen to his knee and not drive through pain. His mom was instructed on how to help him with SLR exercises. Therapy used [place] Stim to stimulate his quad with quad sets. Overall he is progressing  well.     Objective impairments include Abnormal gait, decreased activity tolerance, decreased balance, decreased mobility, difficulty walking, decreased ROM, decreased strength, hypomobility, increased edema, impaired flexibility, and pain. These impairments are limiting patient from cleaning, community activity, driving, meal prep, occupation, laundry, shopping, school, and ambulation .     REHAB POTENTIAL: Good   CLINICAL DECISION MAKING: Stable/uncomplicated   EVALUATION COMPLEXITY: Low     GOALS:     SHORT TERM GOALS:   STG Name Target Date Goal status  1 Pt will be independent and compliant with HEP for improved pain, strength, and ROM.  Baseline:  12/11/2021 INITIAL  2 Pt will demo a good quad set and perform supine SLRs independently with brace for improved quad strength. Baseline:  11/27/2021 INITIAL  3 Pt will demo improved  improved L knee extension PROM/AROM to 0/3 and flexion PROM to 60 deg for improved mobility and stiffness. Baseline: 12/04/2021 INITIAL  4 Pt will demo improved improved L knee extension AROM to 0 deg and  flexion AAROM/PROM to 80/90 deg for improved mobility and stiffness. Baseline: 12/18/2021 INITIAL  5 Pt will demo L knee AROM to be 0 - 120 deg for improved stiffness and performance of functional mobility.   Baseline: 01/22/2022 INITIAL  6 Pt will progress Wb'ing with gait per MD orders and protocol without adverse effects.  Baseline: 12/18/2021 INITIAL  7 Pt will progress with closed chain exercises per protocol without adverse effects for improved functional strength and stability.  Baseline: 12/25/2021 INITIAL  8 Pt will score 0-1 on the lateral step down test on a 4 inch step for improved quad eccentric control.   01/22/2022   INITIAL  9 Pt will score 0-1 on the lateral step down test on a 6 inch step for improved quad eccentric control and performance of stairs.   02/06/2022 INITIAL  10 Pt will ambulate with a normalized heel to toe gait without limping  without AD 01/22/2022 INITIAL    LONG TERM GOALS:    LTG Name Target Date Goal status  1 Pt will be able to perform his ADLs/IADLs and normal functional mobility skills without significant difficulty or pain.  Baseline: 03/05/2022   INITIAL  2 Pt will ambulate extended community distance without an AD without increased pain or difficulty.  Baseline: 02/19/2022 INITIAL  3 Pt will be able to perform his normal daily transfers without difficulty.  Baseline: 02/05/2022 INITIAL  4 Pt will demo good squatting form, symmetrical Wb'ing, and good knee alignment without significant pain for improved functional strength and tolerance with IADLs.  Baseline: 02/19/2022 INITIAL  5 Pt will be able to perform stairs with a reciprocal gait without the rail with good control.  Baseline: 03/05/2022 INITIAL  6 Pt will be able to perform all of his school activities and work activities without significant pain or difficulty.   Baseline: 02/19/2022 INITIAL  7 Pt will demo 5/5 L hip and knee strength for improved tolerance with and performance of functional mobility skills and to assist in returning to PLOF.   Baseline: 03/05/2022 INITIAL    PLAN: PT FREQUENCY: 2x/week   PT DURATION: other: 14-16 weeks   PLANNED INTERVENTIONS: Therapeutic exercises, Therapeutic activity, Neuro Muscular re-education, Balance training, Gait training, Patient/Family education, Joint mobilization, Stair training, DME instructions, Aquatic Therapy, Electrical stimulation, Cryotherapy, Moist heat, Taping, and Manual therapy   PLAN FOR NEXT SESSION: Cont per Dr. Serena Croissant ACL reconstruction with meniscal repair.  Pt currently NWBing.  Assess performance of supine SLR in brace next visit.  Allergy to certain adhesives including tegaderm.       Dessie Coma PT DPT  11/17/2021, 11:28 AM

## 2021-11-20 ENCOUNTER — Ambulatory Visit: Payer: No Typology Code available for payment source | Admitting: Family Medicine

## 2021-11-22 ENCOUNTER — Ambulatory Visit (HOSPITAL_BASED_OUTPATIENT_CLINIC_OR_DEPARTMENT_OTHER): Payer: No Typology Code available for payment source | Admitting: Physical Therapy

## 2021-11-22 ENCOUNTER — Other Ambulatory Visit: Payer: Self-pay

## 2021-11-22 DIAGNOSIS — M25562 Pain in left knee: Secondary | ICD-10-CM

## 2021-11-22 DIAGNOSIS — M25662 Stiffness of left knee, not elsewhere classified: Secondary | ICD-10-CM

## 2021-11-22 DIAGNOSIS — M6281 Muscle weakness (generalized): Secondary | ICD-10-CM

## 2021-11-22 DIAGNOSIS — R262 Difficulty in walking, not elsewhere classified: Secondary | ICD-10-CM

## 2021-11-22 NOTE — Therapy (Signed)
OUTPATIENT PHYSICAL THERAPY TREATMENT NOTE   Patient Name: Dustin Parks MRN: [id number] DOB:27-Feb-2001, 21 y.o., male Today's Date: 11/23/2021  PCP: Libby Maw, MD REFERRING PROVIDER: Dr Donivan Scull    PT End of Session - 11/22/21 1717     Visit Number 3    Number of Visits 32    Date for PT Re-Evaluation 02/05/22    PT Start Time 1611    PT Stop Time 1656    PT Time Calculation (min) 45 min    Equipment Utilized During Treatment Other (comment)   bilat crutches   Activity Tolerance Patient tolerated treatment well    Behavior During Therapy WFL for tasks assessed/performed              Past Medical History:  Diagnosis Date   Asthma    out grown now    Past Surgical History:  Procedure Laterality Date   KNEE ARTHROSCOPY WITH ANTERIOR CRUCIATE LIGAMENT (ACL) REPAIR WITH HAMSTRING GRAFT Left 11/08/2021   Procedure: LEFT KNEE ANTERIOR CRUCIATE LIGAMENT RECONSTRUCTION WITH QUADRICEPS TENDON AUTOGRAFT;  Surgeon: [name], MD;  Location: [place];  Service: Orthopedics;  Laterality: Left;   KNEE ARTHROSCOPY WITH MENISCAL REPAIR  11/08/2021   Procedure: LEFT LATERAL MENISCAL REPAIR;  Surgeon: [name], MD;  Location: [place];  Service: Orthopedics;;   [place] EXTRACTION  2022   Patient Active Problem List   Diagnosis Date Noted   Left anterior cruciate ligament tear    Complex tear of lateral meniscus of left knee as current injury    Somatic dysfunction of spine, cervical 10/05/2021   Adjustment disorder 09/26/2021   Encounter for vasectomy counseling 09/26/2021   Neck pain 09/26/2021   History of orchitis 02/08/2021     PCP: Libby Maw, MD   REFERRING PROVIDER: [name], MD   REFERRING DIAG: [id number] (ICD-10-CM) - Rupture of anterior cruciate ligament of left knee, initial encounter       S83.272A (ICD-10-CM) - Complex tear of lateral meniscus of left knee as current injury,  initial encounter      Z98.890 (ICD-10-CM) - S/P left knee arthroscopy    THERAPY DIAG:  Left knee pain, unspecified chronicity   Stiffness of left knee, not elsewhere classified   Muscle weakness (generalized)   Difficulty in walking, not elsewhere classified   ONSET DATE: Injury 10/09/2021 / Surgery 11/08/2021   SUBJECTIVE:    SUBJECTIVE STATEMENT: Pt is 2 weeks s/p L knee ACL reconstruction with quadriceps autograft and lateral meniscal repair.  Patient has been working hard on his extension.  Pt reports his knee was hurting last night.  He stood a lot in school when he was cooking yesterday.  Pt denies any adverse effects after prior Rx.  Pt reports he hasn't taken an oxycodone in about 10 days.    PERTINENT HISTORY: 11/08/2021 Left knee ACL reconstruction with quadriceps autograft and lateral meniscal repair, NWB x 2 weeks.  Allergy to certain adhesives including tegaderm per pt report    PAIN:  Are you having pain? Not currently NPRS scale: 0/10 current, 7/10 worst pain  Pain location: L knee     PRECAUTIONS: Other: per Dr. Eddie Dibbles ACL reconstruction with meniscal repair   WEIGHT BEARING RESTRICTIONS Yes NWB      OCCUPATION: in culinary school     PLOF: Independent; Pt was able to perform all of his ADLs/IADLs and functional mobility skills without difficulty or limitations.  Pt was able to perform  work activities.  Pt ambulated without an AD with good stability without difficulty.    PATIENT GOALS improved stability with leaning, to have the ability to run, to be able to bowl.  Return to PLOF.      OBJECTIVE:    -LE AROM/PROM:   L knee extension AROM/PROM:  1/0 deg L knee flexion PROM:  55 deg today though was 67 deg prior Rx   -Quad Set:  Fair (-) on R      TODAY'S TREATMENT:   -Positioned Don Joy brace correctly.    Manual Therapy:  -Grade II sup and inf patellar mobs f/b L knee flexion PROM per protocol and pt tolerance in supine and seated at  EOT -Assessed knee ROM  Therapeutic Exercise: Pt performed: Quad set approx 12 reps with 5 sec hold  Supine SLR in brace with mod assist 3x10 reps  L knee flexion PROM with hands under knee 2x10 reps Ankle PF with GTB 2x10 reps   -[place] E-stim with active quad set 10/20, 16.0 x 10 mins in supine to improve quad activation and contraction       PATIENT EDUCATION:  Education details:  Educated pt in post op protocol and restrictions, ROM expectations, POC, dx, objective findings, and relevant anatomy.  Educated pt in correct brace position. HEP.  Instructed pt in using ice.   Person educated: Patient Education method: Explanation, Demonstration, Tactile cues, Verbal cues, and Handouts Education comprehension: verbalized understanding, returned demonstration, verbal cues required, tactile cues required, and needs further education     HOME EXERCISE PROGRAM: Access Code: [email or web address] URL: [email or web address] Date: 11/13/2021 Prepared by: Ronny Flurry   Exercises Supine Quadricep Sets - 2 x daily - 7 x weekly - 2 sets - 10 reps - 5 seconds hold ANKLE PUMPS - 3 x daily - 7 x weekly - 3 sets - 10 reps     ASSESSMENT:   CLINICAL IMPRESSION: Pt is progressing well overall.  He is ambulating with bilat crutches and is compliant with NWB'ing.  Pt demonstrates much improved knee extension AROM.  He is improving with knee flexion ROM and has expected limitations and tightness with flexion.  Pt is slowly progressing with quad contraction.  He is improving with quad set though requires assistance having much difficulty with supine SLR.  PT is using [place] e-stim to facilitate improved quad activation.  Pt responded well to Rx having no pain after Rx.  He should benefit from skilled PT services to address ongoing goals and impairments and to restore PLOF.     Objective impairments include Abnormal gait, decreased activity tolerance, decreased balance, decreased mobility,  difficulty walking, decreased ROM, decreased strength, hypomobility, increased edema, impaired flexibility, and pain. These impairments are limiting patient from cleaning, community activity, driving, meal prep, occupation, laundry, shopping, school, and ambulation .     REHAB POTENTIAL: Good   CLINICAL DECISION MAKING: Stable/uncomplicated   EVALUATION COMPLEXITY: Low     GOALS:     SHORT TERM GOALS:   STG Name Target Date Goal status  1 Pt will be independent and compliant with HEP for improved pain, strength, and ROM.  Baseline:  12/11/2021 INITIAL  2 Pt will demo a good quad set and perform supine SLRs independently with brace for improved quad strength. Baseline:  11/27/2021 INITIAL  3 Pt will demo improved improved L knee extension PROM/AROM to 0/3 and flexion PROM to 60 deg for improved mobility and stiffness. Baseline: 12/04/2021 INITIAL  4 Pt will demo improved  improved L knee extension AROM to 0 deg and  flexion AAROM/PROM to 80/90 deg for improved mobility and stiffness. Baseline: 12/18/2021 INITIAL  5 Pt will demo L knee AROM to be 0 - 120 deg for improved stiffness and performance of functional mobility.   Baseline: 01/22/2022 INITIAL  6 Pt will progress Wb'ing with gait per MD orders and protocol without adverse effects.  Baseline: 12/18/2021 INITIAL  7 Pt will progress with closed chain exercises per protocol without adverse effects for improved functional strength and stability.  Baseline: 12/25/2021 INITIAL  8 Pt will score 0-1 on the lateral step down test on a 4 inch step for improved quad eccentric control.   01/22/2022   INITIAL  9 Pt will score 0-1 on the lateral step down test on a 6 inch step for improved quad eccentric control and performance of stairs.   02/06/2022 INITIAL  10 Pt will ambulate with a normalized heel to toe gait without limping without AD 01/22/2022 INITIAL    LONG TERM GOALS:    LTG Name Target Date Goal status  1 Pt will be able to perform his  ADLs/IADLs and normal functional mobility skills without significant difficulty or pain.  Baseline: 03/05/2022   INITIAL  2 Pt will ambulate extended community distance without an AD without increased pain or difficulty.  Baseline: 02/19/2022 INITIAL  3 Pt will be able to perform his normal daily transfers without difficulty.  Baseline: 02/05/2022 INITIAL  4 Pt will demo good squatting form, symmetrical Wb'ing, and good knee alignment without significant pain for improved functional strength and tolerance with IADLs.  Baseline: 02/19/2022 INITIAL  5 Pt will be able to perform stairs with a reciprocal gait without the rail with good control.  Baseline: 03/05/2022 INITIAL  6 Pt will be able to perform all of his school activities and work activities without significant pain or difficulty.   Baseline: 02/19/2022 INITIAL  7 Pt will demo 5/5 L hip and knee strength for improved tolerance with and performance of functional mobility skills and to assist in returning to PLOF.   Baseline: 03/05/2022 INITIAL    PLAN: PT FREQUENCY: 2x/week   PT DURATION: other: 14-16 weeks   PLANNED INTERVENTIONS: Therapeutic exercises, Therapeutic activity, Neuro Muscular re-education, Balance training, Gait training, Patient/Family education, Joint mobilization, Stair training, DME instructions, Aquatic Therapy, Electrical stimulation, Cryotherapy, Moist heat, Taping, and Manual therapy   PLAN FOR NEXT SESSION: Cont per Dr. Eddie Dibbles ACL reconstruction with meniscal repair.  Pt currently NWBing.   Allergy to certain adhesives including tegaderm.       Selinda Michaels III PT, DPT 11/23/21 10:49 AM

## 2021-11-23 ENCOUNTER — Encounter (HOSPITAL_BASED_OUTPATIENT_CLINIC_OR_DEPARTMENT_OTHER): Payer: Self-pay | Admitting: Orthopaedic Surgery

## 2021-11-23 ENCOUNTER — Encounter (HOSPITAL_BASED_OUTPATIENT_CLINIC_OR_DEPARTMENT_OTHER): Payer: Self-pay | Admitting: Physical Therapy

## 2021-11-23 ENCOUNTER — Encounter (HOSPITAL_BASED_OUTPATIENT_CLINIC_OR_DEPARTMENT_OTHER): Payer: No Typology Code available for payment source | Admitting: Orthopaedic Surgery

## 2021-11-23 ENCOUNTER — Ambulatory Visit (INDEPENDENT_AMBULATORY_CARE_PROVIDER_SITE_OTHER): Payer: No Typology Code available for payment source | Admitting: Orthopaedic Surgery

## 2021-11-23 DIAGNOSIS — S83512D Sprain of anterior cruciate ligament of left knee, subsequent encounter: Secondary | ICD-10-CM

## 2021-11-23 NOTE — Progress Notes (Signed)
Chief Complaint: left knee     History of Present Illness:   11/23/2021: Presents today overall doing extremely well.  He does have some numbness about the lateral aspect of the knee.  He has been compliant with nonweightbearing on crutches.  He has been working on range of motion about the knee.  Surgical History:   None  PMH/PSH/Family History/Social History/Meds/Allergies:    Past Medical History:  Diagnosis Date   Asthma    out grown now    Past Surgical History:  Procedure Laterality Date   KNEE ARTHROSCOPY WITH ANTERIOR CRUCIATE LIGAMENT (ACL) REPAIR WITH HAMSTRING GRAFT Left 11/08/2021   Procedure: LEFT KNEE ANTERIOR CRUCIATE LIGAMENT RECONSTRUCTION WITH QUADRICEPS TENDON AUTOGRAFT;  Surgeon: Huel Cote, MD;  Location: [facility];  Service: Orthopedics;  Laterality: Left;   KNEE ARTHROSCOPY WITH MENISCAL REPAIR  11/08/2021   Procedure: LEFT LATERAL MENISCAL REPAIR;  Surgeon: Huel Cote, MD;  Location: [facility];  Service: Orthopedics;;   WISDOM TOOTH EXTRACTION  2022   Social History   Socioeconomic History   Marital status: Single    Spouse name: Not on file   Number of children: Not on file   Years of education: Not on file   Highest education level: Not on file  Occupational History   Not on file  Tobacco Use   Smoking status: Never   Smokeless tobacco: Never  Vaping Use   Vaping Use: Never used  Substance and Sexual Activity   Alcohol use: Not Currently    Comment: rare   Drug use: Yes    Types: Marijuana    Comment: social   Sexual activity: Yes  Other Topics Concern   Not on file  Social History Narrative   Not on file   Social Determinants of Health   Financial Resource Strain: Not on file  Food Insecurity: Not on file  Transportation Needs: Not on file  Physical Activity: Not on file  Stress: Not on file  Social Connections: Not on file   Family History  Problem  Relation Age of Onset   Healthy Mother    Healthy Father    Healthy Maternal Grandmother    Healthy Paternal Grandfather    No Known Allergies Current Outpatient Medications  Medication Sig Dispense Refill   aspirin EC 325 MG tablet Take 1 tablet (325 mg total) by mouth daily. 30 tablet 0   oxyCODONE (OXY IR/ROXICODONE) 5 MG immediate release tablet Take 1 tablet (5 mg total) by mouth every 4 (four) hours as needed (severe pain). 20 tablet 0   oxycodone (OXY-IR) 5 MG capsule Take 1 capsule (5 mg total) by mouth every 4 (four) hours as needed (severe pain). 20 capsule 0   No current facility-administered medications for this visit.   No results found.  Review of Systems:   A ROS was performed including pertinent positives and negatives as documented in the HPI.  Physical Exam :   Constitutional: NAD and appears stated age Neurological: Alert and oriented Psych: Appropriate affect and cooperative There were no vitals taken for this visit.   Comprehensive Musculoskeletal Exam:     Incisions are well-healing.  He is able to achieve full extension.  Fires his quad.  There is quad atrophy with decreased tone. Imaging:    I personally reviewed  and interpreted the radiographs.   Assessment:   21 year old male status post ACL reconstruction with quadriceps tendon autograft as well as lateral meniscal tear repair.  He has been compliant with nonweightbearing at this point.  I would like to do an additional 2 weeks of nonweightbearing at that time I will plan to progress him as tolerated.  Plan :    -Return to clinic in 4 weeks      I personally saw and evaluated the patient, and participated in the management and treatment plan.  Huel Cote, MD Attending Physician, Orthopedic Surgery  This document was dictated using Dragon voice recognition software. A reasonable attempt at proof reading has been made to minimize errors.

## 2021-12-06 ENCOUNTER — Ambulatory Visit (HOSPITAL_BASED_OUTPATIENT_CLINIC_OR_DEPARTMENT_OTHER): Payer: No Typology Code available for payment source | Attending: Orthopaedic Surgery | Admitting: Physical Therapy

## 2021-12-06 ENCOUNTER — Other Ambulatory Visit: Payer: Self-pay

## 2021-12-06 ENCOUNTER — Encounter (HOSPITAL_BASED_OUTPATIENT_CLINIC_OR_DEPARTMENT_OTHER): Payer: Self-pay | Admitting: Physical Therapy

## 2021-12-06 DIAGNOSIS — M25562 Pain in left knee: Secondary | ICD-10-CM | POA: Insufficient documentation

## 2021-12-06 DIAGNOSIS — R262 Difficulty in walking, not elsewhere classified: Secondary | ICD-10-CM | POA: Insufficient documentation

## 2021-12-06 DIAGNOSIS — M25662 Stiffness of left knee, not elsewhere classified: Secondary | ICD-10-CM | POA: Diagnosis present

## 2021-12-06 DIAGNOSIS — M6281 Muscle weakness (generalized): Secondary | ICD-10-CM | POA: Insufficient documentation

## 2021-12-06 NOTE — Therapy (Signed)
OUTPATIENT PHYSICAL THERAPY TREATMENT NOTE   Patient Name: Dustin Parks MRN: [id number] DOB:2001-07-24, 21 y.o., male Today's Date: 12/06/2021  PCP: Libby Maw, MD REFERRING PROVIDER: Dr Donivan Scull    PT End of Session - 12/06/21 1449     Visit Number 4    Number of Visits 32    Date for PT Re-Evaluation 02/05/22    PT Start Time 1347    PT Stop Time 1428    PT Time Calculation (min) 41 min    Activity Tolerance Patient tolerated treatment well    Behavior During Therapy WFL for tasks assessed/performed               Past Medical History:  Diagnosis Date   Asthma    out grown now    Past Surgical History:  Procedure Laterality Date   KNEE ARTHROSCOPY WITH ANTERIOR CRUCIATE LIGAMENT (ACL) REPAIR WITH HAMSTRING GRAFT Left 11/08/2021   Procedure: LEFT KNEE ANTERIOR CRUCIATE LIGAMENT RECONSTRUCTION WITH QUADRICEPS TENDON AUTOGRAFT;  Surgeon: [name], MD;  Location: [place] [facility];  Service: Orthopedics;  Laterality: Left;   KNEE ARTHROSCOPY WITH MENISCAL REPAIR  11/08/2021   Procedure: LEFT LATERAL MENISCAL REPAIR;  Surgeon: [name], MD;  Location: [place];  Service: Orthopedics;;   [place] EXTRACTION  2022   Patient Active Problem List   Diagnosis Date Noted   Left anterior cruciate ligament tear    Complex tear of lateral meniscus of left knee as current injury    Somatic dysfunction of spine, cervical 10/05/2021   Adjustment disorder 09/26/2021   Encounter for vasectomy counseling 09/26/2021   Neck pain 09/26/2021   History of orchitis 02/08/2021     PCP: Libby Maw, MD   REFERRING PROVIDER: [name], MD   REFERRING DIAG: [phone] (ICD-10-CM) - Rupture of anterior cruciate ligament of left knee, initial encounter       S83.272A (ICD-10-CM) - Complex tear of lateral meniscus of left knee as current injury, initial encounter      Z98.890 (ICD-10-CM) - S/P left knee arthroscopy     THERAPY DIAG:  Left knee pain, unspecified chronicity   Stiffness of left knee, not elsewhere classified   Muscle weakness (generalized)   Difficulty in walking, not elsewhere classified   ONSET DATE: Injury 10/09/2021 / Surgery 11/08/2021   SUBJECTIVE:    SUBJECTIVE STATEMENT: Pt is 2 weeks s/p L knee ACL reconstruction with quadriceps autograft and lateral meniscal repair.  Patient has been working hard on his extension.  Patient was not seen last week. He has been working on his HEP. He feels like his flexion is stuck. He also feels like it is swelling. He will be cleared for weight bearing tomorrow.   PERTINENT HISTORY: 11/08/2021 Left knee ACL reconstruction with quadriceps autograft and lateral meniscal repair, NWB x 2 weeks.  Allergy to certain adhesives including tegaderm per pt report    PAIN:  Are you having pain? Not currently 2/1 NPRS scale: 0/10 current, 7/10 worst pain  Pain location: L knee     PRECAUTIONS: Other: per Dr. Eddie Dibbles ACL reconstruction with meniscal repair   WEIGHT BEARING RESTRICTIONS Yes NWB      OCCUPATION: in culinary school     PLOF: Independent; Pt was able to perform all of his ADLs/IADLs and functional mobility skills without difficulty or limitations.  Pt was able to perform work activities.  Pt ambulated without an AD with good stability without difficulty.    PATIENT  GOALS improved stability with leaning, to have the ability to run, to be able to bowl.  Return to PLOF.      OBJECTIVE:    -LE AROM/PROM:  -3- 71. Seemed to get further without measuring   -Quad Set:  Fair (-) on R      TODAY'S TREATMENT: 2/1 PROM with emphasis on flexion  Quad sets 2x20 SLR 3x10  SL SLR 3x10   Standing weight shift forward and lateral 2x10  Gait: weight bearing training with crutches     1/18 -Positioned Don Joy brace correctly.    Manual Therapy:  -Grade II sup and inf patellar mobs f/b L knee flexion PROM per protocol and pt  tolerance in supine and seated at EOT -Assessed knee ROM  Therapeutic Exercise: Pt performed: Quad set approx 12 reps with 5 sec hold  Supine SLR in brace with mod assist 3x10 reps  L knee flexion PROM with hands under knee 2x10 reps Ankle PF with GTB 2x10 reps   -[place] E-stim with active quad set 10/20, 16.0 x 10 mins in supine to improve quad activation and contraction       PATIENT EDUCATION:  Education details:  Educated pt in post op protocol and restrictions, ROM expectations, POC, dx, objective findings, and relevant anatomy.  Educated pt in correct brace position. HEP.  Instructed pt in using ice.   Person educated: Patient Education method: Explanation, Demonstration, Tactile cues, Verbal cues, and Handouts Education comprehension: verbalized understanding, returned demonstration, verbal cues required, tactile cues required, and needs further education     HOME EXERCISE PROGRAM: Access Code: [email or web address] URL: [email or web address] Date: 11/13/2021 Prepared by: Ronny Flurry   Exercises Supine Quadricep Sets - 2 x daily - 7 x weekly - 2 sets - 10 reps - 5 seconds hold ANKLE PUMPS - 3 x daily - 7 x weekly - 3 sets - 10 reps     ASSESSMENT:   CLINICAL IMPRESSION: The patients flexion is still limited. He was advised to make sure he is stretching 2-3x a day into flexion. Therapy reviewed weight shdfts and ambaultion with the crutches weight bearing he did well. He had no pain and at least with short distances showned no signs of buckling with the brace on. He will practice over the next few days. He tolerated there-ex well. He continues to have an extensor lag and need minor help with initation but once initiated he could rep his SLR. We will continue to progress as tolerated.    Objective impairments include Abnormal gait, decreased activity tolerance, decreased balance, decreased mobility, difficulty walking, decreased ROM, decreased strength, hypomobility,  increased edema, impaired flexibility, and pain. These impairments are limiting patient from cleaning, community activity, driving, meal prep, occupation, laundry, shopping, school, and ambulation .     REHAB POTENTIAL: Good   CLINICAL DECISION MAKING: Stable/uncomplicated   EVALUATION COMPLEXITY: Low     GOALS:     SHORT TERM GOALS:   STG Name Target Date Goal status  1 Pt will be independent and compliant with HEP for improved pain, strength, and ROM.  Baseline:  12/11/2021 INITIAL  2 Pt will demo a good quad set and perform supine SLRs independently with brace for improved quad strength. Baseline:  11/27/2021 INITIAL  3 Pt will demo improved improved L knee extension PROM/AROM to 0/3 and flexion PROM to 60 deg for improved mobility and stiffness. Baseline: 12/04/2021 INITIAL  4 Pt will demo improved improved L knee extension AROM to 0 deg and  flexion AAROM/PROM to 80/90 deg for improved mobility and stiffness. Baseline: 12/18/2021 INITIAL  5 Pt will demo L knee AROM to be 0 - 120 deg for improved stiffness and performance of functional mobility.   Baseline: 01/22/2022 INITIAL  6 Pt will progress Wb'ing with gait per MD orders and protocol without adverse effects.  Baseline: 12/18/2021 INITIAL  7 Pt will progress with closed chain exercises per protocol without adverse effects for improved functional strength and stability.  Baseline: 12/25/2021 INITIAL  8 Pt will score 0-1 on the lateral step down test on a 4 inch step for improved quad eccentric control.   01/22/2022   INITIAL  9 Pt will score 0-1 on the lateral step down test on a 6 inch step for improved quad eccentric control and performance of stairs.   02/06/2022 INITIAL  10 Pt will ambulate with a normalized heel to toe gait without limping without AD 01/22/2022 INITIAL    LONG TERM GOALS:    LTG Name Target Date Goal status  1 Pt will be able to perform his ADLs/IADLs and normal functional mobility skills without significant  difficulty or pain.  Baseline: 03/05/2022   INITIAL  2 Pt will ambulate extended community distance without an AD without increased pain or difficulty.  Baseline: 02/19/2022 INITIAL  3 Pt will be able to perform his normal daily transfers without difficulty.  Baseline: 02/05/2022 INITIAL  4 Pt will demo good squatting form, symmetrical Wb'ing, and good knee alignment without significant pain for improved functional strength and tolerance with IADLs.  Baseline: 02/19/2022 INITIAL  5 Pt will be able to perform stairs with a reciprocal gait without the rail with good control.  Baseline: 03/05/2022 INITIAL  6 Pt will be able to perform all of his school activities and work activities without significant pain or difficulty.   Baseline: 02/19/2022 INITIAL  7 Pt will demo 5/5 L hip and knee strength for improved tolerance with and performance of functional mobility skills and to assist in returning to PLOF.   Baseline: 03/05/2022 INITIAL    PLAN: PT FREQUENCY: 2x/week   PT DURATION: other: 14-16 weeks   PLANNED INTERVENTIONS: Therapeutic exercises, Therapeutic activity, Neuro Muscular re-education, Balance training, Gait training, Patient/Family education, Joint mobilization, Stair training, DME instructions, Aquatic Therapy, Electrical stimulation, Cryotherapy, Moist heat, Taping, and Manual therapy   PLAN FOR NEXT SESSION: Cont per Dr. Eddie Dibbles ACL reconstruction with meniscal repair.  Pt currently NWBing.   Allergy to certain adhesives including tegaderm.       12/06/21 2:58 PM

## 2021-12-08 ENCOUNTER — Other Ambulatory Visit: Payer: Self-pay

## 2021-12-08 ENCOUNTER — Ambulatory Visit (HOSPITAL_BASED_OUTPATIENT_CLINIC_OR_DEPARTMENT_OTHER): Payer: No Typology Code available for payment source | Admitting: Physical Therapy

## 2021-12-08 ENCOUNTER — Encounter (HOSPITAL_BASED_OUTPATIENT_CLINIC_OR_DEPARTMENT_OTHER): Payer: Self-pay | Admitting: Physical Therapy

## 2021-12-08 DIAGNOSIS — R262 Difficulty in walking, not elsewhere classified: Secondary | ICD-10-CM

## 2021-12-08 DIAGNOSIS — M25562 Pain in left knee: Secondary | ICD-10-CM | POA: Diagnosis not present

## 2021-12-08 DIAGNOSIS — M25662 Stiffness of left knee, not elsewhere classified: Secondary | ICD-10-CM

## 2021-12-08 DIAGNOSIS — M6281 Muscle weakness (generalized): Secondary | ICD-10-CM

## 2021-12-08 NOTE — Therapy (Signed)
OUTPATIENT PHYSICAL THERAPY TREATMENT NOTE   Patient Name: Dustin Parks MRN: [id number] DOB:2000-12-03, 21 y.o., male Today's Date: 12/08/2021  PCP: Libby Maw, MD REFERRING PROVIDER: Dr Donivan Scull        Past Medical History:  Diagnosis Date   Asthma    out grown now    Past Surgical History:  Procedure Laterality Date   KNEE ARTHROSCOPY WITH ANTERIOR CRUCIATE LIGAMENT (ACL) REPAIR WITH HAMSTRING GRAFT Left 11/08/2021   Procedure: LEFT KNEE ANTERIOR CRUCIATE LIGAMENT RECONSTRUCTION WITH QUADRICEPS TENDON AUTOGRAFT;  Surgeon: [name], MD;  Location: [place];  Service: Orthopedics;  Laterality: Left;   KNEE ARTHROSCOPY WITH MENISCAL REPAIR  11/08/2021   Procedure: LEFT LATERAL MENISCAL REPAIR;  Surgeon: [name], MD;  Location: [place];  Service: Orthopedics;;   [place] EXTRACTION  2022   Patient Active Problem List   Diagnosis Date Noted   Left anterior cruciate ligament tear    Complex tear of lateral meniscus of left knee as current injury    Somatic dysfunction of spine, cervical 10/05/2021   Adjustment disorder 09/26/2021   Encounter for vasectomy counseling 09/26/2021   Neck pain 09/26/2021   History of orchitis 02/08/2021     PCP: Libby Maw, MD   REFERRING PROVIDER: [name], MD   REFERRING DIAG: [phone] (ICD-10-CM) - Rupture of anterior cruciate ligament of left knee, initial encounter       S83.272A (ICD-10-CM) - Complex tear of lateral meniscus of left knee as current injury, initial encounter      Z98.890 (ICD-10-CM) - S/P left knee arthroscopy    THERAPY DIAG:  Left knee pain, unspecified chronicity   Stiffness of left knee, not elsewhere classified   Muscle weakness (generalized)   Difficulty in walking, not elsewhere classified   ONSET DATE: Injury 10/09/2021 / Surgery 11/08/2021   SUBJECTIVE:    SUBJECTIVE STATEMENT: Patient reports his leg fell off the  bed while sleeping the other night. He reports just minor pain at this time.   PERTINENT HISTORY: 11/08/2021 Left knee ACL reconstruction with quadriceps autograft and lateral meniscal repair, NWB x 2 weeks.  Allergy to certain adhesives including tegaderm per pt report    PAIN:  PAIN:  Are you having pain? Yes VAS scale: 1/10 Pain location: right lateral knee  Pain orientation: Right  PAIN TYPE: aching Pain description: intermittent  Aggravating factors: use of the knee / falling off the bed  Relieving factors: rest     PRECAUTIONS: Other: per Dr. Eddie Dibbles ACL reconstruction with meniscal repair   WEIGHT BEARING RESTRICTIONS Yes NWB      OCCUPATION: in culinary school     PLOF: Independent; Pt was able to perform all of his ADLs/IADLs and functional mobility skills without difficulty or limitations.  Pt was able to perform work activities.  Pt ambulated without an AD with good stability without difficulty.    PATIENT GOALS improved stability with leaning, to have the ability to run, to be able to bowl.  Return to PLOF.      OBJECTIVE:    -LE AROM/PROM:  -3- 69.    -Quad Set:  Fair (-) on R      TODAY'S TREATMENT: 2/1 PROM with emphasis on flexion  Quad sets 2x20 SLR 3x10  SL SLR 3x10   Standing weight shift forward and lateral 2x10  Gait: weight bearing training with crutches   -[place] E-stim with active quad set 10/20, 16.0 x 10 mins in supine  to improve quad activation and contraction    1/18 -Positioned Don Joy brace correctly.    Manual Therapy:  -Grade II sup and inf patellar mobs f/b L knee flexion PROM per protocol and pt tolerance in supine and seated at EOT -Assessed knee ROM  Therapeutic Exercise: Pt performed: Quad set approx 12 reps with 5 sec hold  Supine SLR in brace with mod assist 3x10 reps  L knee flexion PROM with hands under knee 2x10 reps Ankle PF with GTB 2x10 reps   -[place] E-stim with active quad set 10/20, 16.0 x 10 mins in  supine to improve quad activation and contraction       PATIENT EDUCATION:  Education details:  Educated pt in post op protocol and restrictions, ROM expectations, POC, dx, objective findings, and relevant anatomy.  Educated pt in correct brace position. HEP.  Instructed pt in using ice.   Person educated: Patient Education method: Explanation, Demonstration, Tactile cues, Verbal cues, and Handouts Education comprehension: verbalized understanding, returned demonstration, verbal cues required, tactile cues required, and needs further education     HOME EXERCISE PROGRAM: Access Code: [email or web address] URL: [email or web address] Date: 11/13/2021 Prepared by: Ronny Flurry   Exercises Supine Quadricep Sets - 2 x daily - 7 x weekly - 2 sets - 10 reps - 5 seconds hold ANKLE PUMPS - 3 x daily - 7 x weekly - 3 sets - 10 reps     ASSESSMENT:   CLINICAL IMPRESSION: Patient gained 4 degrees of flexion from the last visit. He remains limited for where we want him to be, but he is better then last time. He was advised to continue stretching at home. He also continues to have limitations as far as his quad goes. He is still having a significant quad lag with SLR. We used E-stim on the quad today We also talked to him about BFR but our cuff was not charged up enough. We will continue to progress weight bearing activity as tolerated.    Objective impairments include Abnormal gait, decreased activity tolerance, decreased balance, decreased mobility, difficulty walking, decreased ROM, decreased strength, hypomobility, increased edema, impaired flexibility, and pain. These impairments are limiting patient from cleaning, community activity, driving, meal prep, occupation, laundry, shopping, school, and ambulation .     REHAB POTENTIAL: Good   CLINICAL DECISION MAKING: Stable/uncomplicated   EVALUATION COMPLEXITY: Low     GOALS:     SHORT TERM GOALS:   STG Name Target Date Goal status  1  Pt will be independent and compliant with HEP for improved pain, strength, and ROM.  Baseline:  12/11/2021 INITIAL  2 Pt will demo a good quad set and perform supine SLRs independently with brace for improved quad strength. Baseline:  11/27/2021 INITIAL  3 Pt will demo improved improved L knee extension PROM/AROM to 0/3 and flexion PROM to 60 deg for improved mobility and stiffness. Baseline: 12/04/2021 INITIAL  4 Pt will demo improved improved L knee extension AROM to 0 deg and  flexion AAROM/PROM to 80/90 deg for improved mobility and stiffness. Baseline: 12/18/2021 INITIAL  5 Pt will demo L knee AROM to be 0 - 120 deg for improved stiffness and performance of functional mobility.   Baseline: 01/22/2022 INITIAL  6 Pt will progress Wb'ing with gait per MD orders and protocol without adverse effects.  Baseline: 12/18/2021 INITIAL  7 Pt will progress with closed chain exercises per protocol without adverse effects for improved functional strength and stability.  Baseline: 12/25/2021 INITIAL  8  Pt will score 0-1 on the lateral step down test on a 4 inch step for improved quad eccentric control.   01/22/2022   INITIAL  9 Pt will score 0-1 on the lateral step down test on a 6 inch step for improved quad eccentric control and performance of stairs.   02/06/2022 INITIAL  10 Pt will ambulate with a normalized heel to toe gait without limping without AD 01/22/2022 INITIAL    LONG TERM GOALS:    LTG Name Target Date Goal status  1 Pt will be able to perform his ADLs/IADLs and normal functional mobility skills without significant difficulty or pain.  Baseline: 03/05/2022   INITIAL  2 Pt will ambulate extended community distance without an AD without increased pain or difficulty.  Baseline: 02/19/2022 INITIAL  3 Pt will be able to perform his normal daily transfers without difficulty.  Baseline: 02/05/2022 INITIAL  4 Pt will demo good squatting form, symmetrical Wb'ing, and good knee alignment without significant  pain for improved functional strength and tolerance with IADLs.  Baseline: 02/19/2022 INITIAL  5 Pt will be able to perform stairs with a reciprocal gait without the rail with good control.  Baseline: 03/05/2022 INITIAL  6 Pt will be able to perform all of his school activities and work activities without significant pain or difficulty.   Baseline: 02/19/2022 INITIAL  7 Pt will demo 5/5 L hip and knee strength for improved tolerance with and performance of functional mobility skills and to assist in returning to PLOF.   Baseline: 03/05/2022 INITIAL    PLAN: PT FREQUENCY: 2x/week   PT DURATION: other: 14-16 weeks   PLANNED INTERVENTIONS: Therapeutic exercises, Therapeutic activity, Neuro Muscular re-education, Balance training, Gait training, Patient/Family education, Joint mobilization, Stair training, DME instructions, Aquatic Therapy, Electrical stimulation, Cryotherapy, Moist heat, Taping, and Manual therapy   PLAN FOR NEXT SESSION: Cont per Dr. Eddie Dibbles ACL reconstruction with meniscal repair.  Pt currently NWBing.   Allergy to certain adhesives including tegaderm.     Carolyne Littles PT DPT   12/08/21 9:32 AM

## 2021-12-13 ENCOUNTER — Encounter: Payer: Self-pay | Admitting: Nurse Practitioner

## 2021-12-13 ENCOUNTER — Encounter: Payer: Self-pay | Admitting: Family Medicine

## 2021-12-13 ENCOUNTER — Other Ambulatory Visit: Payer: Self-pay

## 2021-12-13 ENCOUNTER — Telehealth: Payer: Self-pay | Admitting: Family Medicine

## 2021-12-13 ENCOUNTER — Ambulatory Visit (INDEPENDENT_AMBULATORY_CARE_PROVIDER_SITE_OTHER): Payer: No Typology Code available for payment source | Admitting: Nurse Practitioner

## 2021-12-13 VITALS — BP 124/94 | HR 69 | Temp 97.6°F | Ht 73.01 in | Wt 308.2 lb

## 2021-12-13 DIAGNOSIS — T783XXA Angioneurotic edema, initial encounter: Secondary | ICD-10-CM | POA: Insufficient documentation

## 2021-12-13 DIAGNOSIS — T7840XA Allergy, unspecified, initial encounter: Secondary | ICD-10-CM | POA: Diagnosis not present

## 2021-12-13 MED ORDER — EPINEPHRINE 0.3 MG/0.3ML IJ SOAJ: 0.3000 mg | INTRAMUSCULAR | 1 refills | Status: AC | PRN

## 2021-12-13 MED ORDER — METHYLPREDNISOLONE SODIUM SUCC 40 MG IJ SOLR
40.0000 mg | Freq: Once | INTRAMUSCULAR | Status: AC
  Administered 2021-12-13: 40 mg via INTRAMUSCULAR

## 2021-12-13 MED ORDER — PREDNISONE 10 MG PO TABS: ORAL_TABLET | ORAL | 0 refills | Status: DC

## 2021-12-13 NOTE — Progress Notes (Signed)
Acute Office Visit  Subjective:    Patient ID: Dustin Parks, male    DOB: 19-Jun-2001, 21 y.o.   MRN: [id number]  Chief Complaint  Patient presents with   Follow-up    Pt c/o swelling in left side of tongue and both lips x3 days     HPI Patient is in today for swelling of left side of his tongue and lip for the past 3 days.  He denies any changes in prescription or over the counter medications. Earlier in the day before the symptoms started, he had drink a large amount of orange juice which he hadn't had in a while. Symptoms started with a lump in throat. He then had a lot of water thinking he was dehydrated. The next day on the way to school, he noted his tongue swelling on left side. Then his lips started swelling, starting with the bottom lip, then top lip. He took benadryl which helped his symptoms. Still having top lip swelling. No shortness of breath or wheezing.   Past Medical History:  Diagnosis Date   Asthma    out grown now     Past Surgical History:  Procedure Laterality Date   KNEE ARTHROSCOPY WITH ANTERIOR CRUCIATE LIGAMENT (ACL) REPAIR WITH HAMSTRING GRAFT Left 11/08/2021   Procedure: LEFT KNEE ANTERIOR CRUCIATE LIGAMENT RECONSTRUCTION WITH QUADRICEPS TENDON AUTOGRAFT;  Surgeon: Huel Cote, MD;  Location: [facility];  Service: Orthopedics;  Laterality: Left;   KNEE ARTHROSCOPY WITH MENISCAL REPAIR  11/08/2021   Procedure: LEFT LATERAL MENISCAL REPAIR;  Surgeon: Huel Cote, MD;  Location: [facility];  Service: Orthopedics;;   WISDOM TOOTH EXTRACTION  2022    Family History  Problem Relation Age of Onset   Healthy Mother    Healthy Father    Healthy Maternal Grandmother    Healthy Paternal Grandfather     Social History   Socioeconomic History   Marital status: Single    Spouse name: Not on file   Number of children: Not on file   Years of education: Not on file   Highest education level: Not on file  Occupational  History   Not on file  Tobacco Use   Smoking status: Never   Smokeless tobacco: Never  Vaping Use   Vaping Use: Never used  Substance and Sexual Activity   Alcohol use: Not Currently    Comment: rare   Drug use: Yes    Types: Marijuana    Comment: social   Sexual activity: Yes  Other Topics Concern   Not on file  Social History Narrative   Not on file   Social Determinants of Health   Financial Resource Strain: Not on file  Food Insecurity: Not on file  Transportation Needs: Not on file  Physical Activity: Not on file  Stress: Not on file  Social Connections: Not on file  Intimate Partner Violence: Not on file    Outpatient Medications Prior to Visit  Medication Sig Dispense Refill   aspirin EC 325 MG tablet Take 1 tablet (325 mg total) by mouth daily. 30 tablet 0   oxyCODONE (OXY IR/ROXICODONE) 5 MG immediate release tablet Take 1 tablet (5 mg total) by mouth every 4 (four) hours as needed (severe pain). 20 tablet 0   oxycodone (OXY-IR) 5 MG capsule Take 1 capsule (5 mg total) by mouth every 4 (four) hours as needed (severe pain). 20 capsule 0   No facility-administered medications prior to visit.    No  Known Allergies  Review of Systems See pertinent positives and negatives per HPI.    Objective:    Physical Exam Vitals and nursing note reviewed.  Constitutional:      Appearance: Normal appearance.  HENT:     Head: Normocephalic.     Mouth/Throat:     Comments: Swollen top lip Eyes:     Conjunctiva/sclera: Conjunctivae normal.  Cardiovascular:     Rate and Rhythm: Normal rate and regular rhythm.     Pulses: Normal pulses.     Heart sounds: Normal heart sounds.  Pulmonary:     Effort: Pulmonary effort is normal.     Breath sounds: Normal breath sounds.  Musculoskeletal:     Cervical back: Normal range of motion.  Skin:    General: Skin is warm.  Neurological:     General: No focal deficit present.     Mental Status: He is alert and oriented to  person, place, and time.  Psychiatric:        Mood and Affect: Mood normal.        Behavior: Behavior normal.        Thought Content: Thought content normal.        Judgment: Judgment normal.    BP (!) 124/94 (BP Location: Right Arm, Cuff Size: Large)    Pulse 69    Temp 97.6 F (36.4 C) (Temporal)    Ht 6' 1.01" (1.854 m)    Wt (!) 308 lb 3.2 oz (139.8 kg)    SpO2 97%    BMI 40.65 kg/m  Wt Readings from Last 3 Encounters:  12/13/21 (!) 308 lb 3.2 oz (139.8 kg) (>99 %, Z= 3.15)*  11/08/21 (!) 305 lb 8.9 oz (138.6 kg) (>99 %, Z= 3.12)*  10/05/21 (!) 303 lb (137.4 kg) (>99 %, Z= 3.09)*   * Growth percentiles are based on CDC (Boys, 2-20 Years) data.    Health Maintenance Due  Topic Date Due   HPV VACCINES (1 - Male 2-dose series) Never done       Topic Date Due   HPV VACCINES (1 - Male 2-dose series) Never done     No results found for: TSH Lab Results  Component Value Date   WBC 8.1 08/18/2020   HGB 15.8 08/18/2020   HCT 46.9 08/18/2020   MCV 81.5 08/18/2020   PLT 192.0 08/18/2020   Lab Results  Component Value Date   NA 137 08/18/2020   K 3.9 08/18/2020   CO2 25 08/18/2020   GLUCOSE 105 (H) 08/18/2020   BUN 14 08/18/2020   CREATININE 0.75 08/18/2020   BILITOT 0.7 08/18/2020   ALKPHOS 42 (L) 08/18/2020   AST 28 08/18/2020   ALT 30 08/18/2020   PROT 7.9 08/18/2020   ALBUMIN 4.6 08/18/2020   CALCIUM 10.1 08/18/2020   GFR 133.76 08/18/2020   Lab Results  Component Value Date   CHOL 168 08/18/2020   Lab Results  Component Value Date   HDL 44.30 08/18/2020   No results found for: [facility] Lab Results  Component Value Date   TRIG 242.0 (H) 08/18/2020   Lab Results  Component Value Date   CHOLHDL 4 08/18/2020   No results found for: HGBA1C     Assessment & Plan:   Problem List Items Addressed This Visit       Other   Allergic reaction - Primary    Will treat with solumedrol 40mg  IM in office and then prednsione taper starting tomorrow. Advised  to avoid  orange juice. With severe reaction, will send in prescription for epi pen to have as needed. Will check citrus allergy panel per patient request. 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 Follow up if symptoms worsen or don't improve.       Relevant Orders   Allergen Profile, Food-Citrus     Meds ordered this encounter  Medications   predniSONE (DELTASONE) 10 MG tablet    Sig: Take 6 tablets today, then 5 tablets tomorrow, then decrease by 1 tablet every day until gone    Dispense:  21 tablet    Refill:  0   methylPREDNISolone sodium succinate (SOLU-MEDROL) 40 mg/mL injection 40 mg   EPINEPHrine 0.3 mg/0.3 mL IJ SOAJ injection    Sig: Inject 0.3 mg into the muscle as needed for anaphylaxis.    Dispense:  2 each    Refill:  1     Gerre Scull, NP

## 2021-12-13 NOTE — Patient Instructions (Signed)
It was great to see you!  We gave you a steroid injection in the office today. Start prednisone taper starting tomorrow morning with food. You will take 6 tablets tomorrow, then 5 tablets the next day, then decrease by 1 every day until gone.   We are checking a citrus allergy panel on you. Avoid orange juice since that could have been the possible trigger. I have sent an epi pen to your pharmacy. Keep this on-hand at all times and use if you feel like your are having trouble breathing or throat closing.   Follow up if your symptoms worsen or don't improve.   Take care,  Rodman Pickle, NP

## 2021-12-13 NOTE — Telephone Encounter (Signed)
Pt wanted to know if he can transfer care from Dr Doreene Burke to Dr Veto Kemps. He didn't have a particular reason why. Is this okay?

## 2021-12-13 NOTE — Assessment & Plan Note (Signed)
Will treat with solumedrol 40mg  IM in office and then prednsione taper starting tomorrow. Advised to avoid orange juice. With severe reaction, will send in prescription for epi pen to have as needed. Will check citrus allergy panel per patient request. 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 Follow up if symptoms worsen or don't improve.

## 2021-12-14 ENCOUNTER — Ambulatory Visit (HOSPITAL_BASED_OUTPATIENT_CLINIC_OR_DEPARTMENT_OTHER): Payer: No Typology Code available for payment source | Admitting: Physical Therapy

## 2021-12-14 ENCOUNTER — Encounter (HOSPITAL_BASED_OUTPATIENT_CLINIC_OR_DEPARTMENT_OTHER): Payer: Self-pay | Admitting: Physical Therapy

## 2021-12-14 DIAGNOSIS — M25562 Pain in left knee: Secondary | ICD-10-CM | POA: Diagnosis not present

## 2021-12-14 DIAGNOSIS — R262 Difficulty in walking, not elsewhere classified: Secondary | ICD-10-CM

## 2021-12-14 DIAGNOSIS — M6281 Muscle weakness (generalized): Secondary | ICD-10-CM

## 2021-12-14 DIAGNOSIS — M25662 Stiffness of left knee, not elsewhere classified: Secondary | ICD-10-CM

## 2021-12-14 NOTE — Therapy (Addendum)
OUTPATIENT PHYSICAL THERAPY TREATMENT NOTE / PROGRESS NOTE   Patient Name: Dustin Parks MRN: [id number] DOB:[birth year], 21 y.o., male Today's Date: 12/14/2021  PCP: Libby Maw, MD REFERRING PROVIDER: Dr Donivan Scull    PT End of Session - 12/14/21 1353     Visit Number 6    Number of Visits 32    Date for PT Re-Evaluation 02/05/22    Progress Note Due on Visit --   01/11/2022   PT Start Time 0102    PT Stop Time 1350    PT Time Calculation (min) 45 min    Equipment Utilized During Treatment Other (comment)   crutches and brace   Activity Tolerance Patient tolerated treatment well    Behavior During Therapy WFL for tasks assessed/performed                Past Medical History:  Diagnosis Date   Asthma    out grown now    Past Surgical History:  Procedure Laterality Date   KNEE ARTHROSCOPY WITH ANTERIOR CRUCIATE LIGAMENT (ACL) REPAIR WITH HAMSTRING GRAFT Left 11/08/2021   Procedure: LEFT KNEE ANTERIOR CRUCIATE LIGAMENT RECONSTRUCTION WITH QUADRICEPS TENDON AUTOGRAFT;  Surgeon: [name], MD;  Location: [place];  Service: Orthopedics;  Laterality: Left;   KNEE ARTHROSCOPY WITH MENISCAL REPAIR  11/08/2021   Procedure: LEFT LATERAL MENISCAL REPAIR;  Surgeon: [name], MD;  Location: [place];  Service: Orthopedics;;   [place] EXTRACTION  2022   Patient Active Problem List   Diagnosis Date Noted   Allergic reaction 12/13/2021   Left anterior cruciate ligament tear    Complex tear of lateral meniscus of left knee as current injury    Somatic dysfunction of spine, cervical 10/05/2021   Adjustment disorder 09/26/2021   Encounter for vasectomy counseling 09/26/2021   Neck pain 09/26/2021   History of orchitis 02/08/2021     PCP: Libby Maw, MD   REFERRING PROVIDER: [name], MD   REFERRING DIAG: [phone] (ICD-10-CM) - Rupture of anterior cruciate ligament of left knee, initial  encounter       S83.272A (ICD-10-CM) - Complex tear of lateral meniscus of left knee as current injury, initial encounter      Z98.890 (ICD-10-CM) - S/P left knee arthroscopy    THERAPY DIAG:  Left knee pain, unspecified chronicity   Stiffness of left knee, not elsewhere classified   Muscle weakness (generalized)   Difficulty in walking, not elsewhere classified   ONSET DATE: Injury 10/09/2021 / Surgery 11/08/2021   SUBJECTIVE:    SUBJECTIVE STATEMENT: -Pt is 5 weeks and 1 day s/p Left knee ACL reconstruction with quadriceps autograft and lateral meniscal repair.  Pt has been ambulating with bilat crutches mainly but has been using 1 crutch some.  He has tried to ambulate in apartment without a crutch.   -Pt helped move a dresser the other day and had no pain.   HEP COMPLIANCE:  Pt states he is doing his HEP though not as much as he should.  He is doing his ex's at least one time per day. RESPONSE TO PRIOR RX:  Pt denies any adverse effects after prior Rx.   FUNCTIONAL IMPROVEMENTS:  Walking more steadily, standing and ambulation duration, "bend is a little better", showering, car transfers, driving FUNCTIONAL LIMITATIONS:  ambulation, standing activities, stairs, transfers, donning L shoe  PERTINENT HISTORY: 11/08/2021 Left knee ACL reconstruction with quadriceps autograft and lateral meniscal repair.  Allergy to certain adhesives including tegaderm  per pt report    PAIN:  PAIN:  Are you having pain? No VAS scale: 0/10 current, 3/10 worst pain Pain location: right lateral knee  Pain orientation: Right  PAIN TYPE: aching Pain description: intermittent  Aggravating factors: use of the knee / falling off the bed  Relieving factors: rest     PRECAUTIONS: Other: per Dr. Eddie Dibbles ACL reconstruction with meniscal repair   WEIGHT BEARING RESTRICTIONS Yes      OCCUPATION: in culinary school     PLOF: Independent; Pt was able to perform all of his ADLs/IADLs and functional mobility  skills without difficulty or limitations.  Pt was able to perform work activities.  Pt ambulated without an AD with good stability without difficulty.    PATIENT GOALS improved stability with leaning, to have the ability to run, to be able to bowl.  Return to PLOF.      OBJECTIVE:       TODAY'S TREATMENT: Therapeutic Exercise:  FOTO:   43 with a goal of 70  L KNEE ROM: AROM:  0 - 69 ; FLEXION AAROM:  71 FLEXION PROM:  68 deg  STRENGTH: -Pt able to perform supine SLR in brace locked in extension with cuing for correct form.   -Pt has a 22 deg extensor lag with supine SLR without brace.              -Quad Set:  Fair on R  GAIT: Pt ambulated with bilat crutches and KI locked in extension well.  Pt ambulated with 1 crutch with brace locked in extension with increased R lean having increased Wb'ing thru R crutch and increased stance time on R LE.   -Pt performed:    Supine SLR in brace locked in extension x10 reps   Longsitting knee flexion PROM with hands under knees 2x10 reps.  -Pt received L knee flexion PROM seated at EOT. -PT Adjusted and positioned brace -Didn't perform e-stim today due to pt not having enough time.        PATIENT EDUCATION:  Education details:  Educated pt in post op protocol and restrictions, ROM expectations, POC, and objective findings.  Educated pt in correct brace position. Instructed pt in increasing ROM exercise at home and being compliant with HEP.  Instructed pt in using ice.   Person educated: Patient Education method: Explanation, Demonstration, Tactile cues, Verbal cues, and Handouts Education comprehension: verbalized understanding, returned demonstration, verbal cues required, tactile cues required, and needs further education     HOME EXERCISE PROGRAM: Access Code: [email or web address] URL: [email or web address] Date: 11/13/2021 Prepared by: Ronny Flurry   Exercises Supine Quadricep Sets - 2 x daily - 7 x weekly - 2 sets - 10 reps  - 5 seconds hold ANKLE PUMPS - 3 x daily - 7 x weekly - 3 sets - 10 reps     ASSESSMENT:   CLINICAL IMPRESSION: Pt is improving with quad activation, gait, function, and mobility.  Pt continues to have quad deficits though is making slow progress.  He has an improved quad set though continues to have difficulty with supine SLR>  He has improved with supine SLR in brace though has a significant extensor lag when attempting out of the brace.  Pt demonstrates full L knee extension ROM.  He is tight and limited with flexion ROM and is making slow progress with flexion.  Pt demonstrates improved self perceived disability with FOTO score improving from 26 to 43.  Pt is improving with gait and is able to  increase Wb'ing per MD orders.  Pt is progressing toward goals.  He responded well to Rx having no c/o's after Rx.  Pt should continue to benefit from cont skilled PT services per protocol to address ongoing goals and to restore PLOF.        Objective impairments include Abnormal gait, decreased activity tolerance, decreased balance, decreased mobility, difficulty walking, decreased ROM, decreased strength, hypomobility, increased edema, impaired flexibility, and pain. These impairments are limiting patient from cleaning, community activity, driving, meal prep, occupation, laundry, shopping, school, and ambulation .     REHAB POTENTIAL: Good   CLINICAL DECISION MAKING: Stable/uncomplicated   EVALUATION COMPLEXITY: Low     GOALS:     SHORT TERM GOALS:   STG Name Target Date Goal status  1 Pt will be independent and compliant with HEP for improved pain, strength, and ROM.  Baseline:  12/11/2021 PARTIALLY MET  2 Pt will demo a good quad set and perform supine SLRs independently with brace for improved quad strength. Baseline:  11/27/2021 PROGRESSING  3 Pt will demo improved improved L knee extension PROM/AROM to 0/3 and flexion PROM to 60 deg for improved mobility and stiffness. Baseline: 12/04/2021  GOAL MET  4 Pt will demo improved improved L knee extension AROM to 0 deg and  flexion AAROM/PROM to 80/90 deg for improved mobility and stiffness. Baseline: 12/18/2021 PARTIALLY MET  5 Pt will demo L knee AROM to be 0 - 120 deg for improved stiffness and performance of functional mobility.   Baseline: 01/22/2022 INITIAL  6 Pt will progress Wb'ing with gait per MD orders and protocol without adverse effects.  Baseline: 12/18/2021 PROGRESSING  7 Pt will progress with closed chain exercises per protocol without adverse effects for improved functional strength and stability.  Baseline: 12/25/2021 INITIAL  8 Pt will score 0-1 on the lateral step down test on a 4 inch step for improved quad eccentric control.   01/22/2022   INITIAL  9 Pt will score 0-1 on the lateral step down test on a 6 inch step for improved quad eccentric control and performance of stairs.   02/06/2022 INITIAL  10 Pt will ambulate with a normalized heel to toe gait without limping without AD 01/22/2022 INITIAL    LONG TERM GOALS:    LTG Name Target Date Goal status  1 Pt will be able to perform his ADLs/IADLs and normal functional mobility skills without significant difficulty or pain.  Baseline: 03/05/2022   INITIAL  2 Pt will ambulate extended community distance without an AD without increased pain or difficulty.  Baseline: 02/19/2022 INITIAL  3 Pt will be able to perform his normal daily transfers without difficulty.  Baseline: 02/05/2022 INITIAL  4 Pt will demo good squatting form, symmetrical Wb'ing, and good knee alignment without significant pain for improved functional strength and tolerance with IADLs.  Baseline: 02/19/2022 INITIAL  5 Pt will be able to perform stairs with a reciprocal gait without the rail with good control.  Baseline: 03/05/2022 INITIAL  6 Pt will be able to perform all of his school activities and work activities without significant pain or difficulty.   Baseline: 02/19/2022 INITIAL  7 Pt will demo 5/5 L hip  and knee strength for improved tolerance with and performance of functional mobility skills and to assist in returning to PLOF.   Baseline: 03/05/2022 INITIAL    PLAN: PT FREQUENCY: 2x/week   PT DURATION: other: 10-12 weeks   PLANNED INTERVENTIONS: Therapeutic exercises, Therapeutic activity, Neuro Muscular re-education,  Balance training, Gait training, Patient/Family education, Joint mobilization, Stair training, DME instructions, Aquatic Therapy, Electrical stimulation, Cryotherapy, Moist heat, Taping, and Manual therapy   PLAN FOR NEXT SESSION: Cont per Dr. Eddie Dibbles ACL reconstruction with meniscal repair.  Pt currently NWBing.   Allergy to certain adhesives including tegaderm.     Selinda Michaels III PT, DPT 12/14/21 4:30 PM

## 2021-12-15 NOTE — Telephone Encounter (Signed)
Pt scheduled  

## 2021-12-16 ENCOUNTER — Encounter (HOSPITAL_BASED_OUTPATIENT_CLINIC_OR_DEPARTMENT_OTHER): Payer: Self-pay | Admitting: Physical Therapy

## 2021-12-18 ENCOUNTER — Telehealth (HOSPITAL_BASED_OUTPATIENT_CLINIC_OR_DEPARTMENT_OTHER): Payer: Self-pay | Admitting: Orthopaedic Surgery

## 2021-12-18 NOTE — Telephone Encounter (Signed)
Patient called and informed me his post op knee brace has broken and he needs a new one. I have coordinated with [name] with [employer], and informed the patient he can come tomorrow 12/19/21 to retrieve and be fitted with a new knee brace. Patient confirmed and said he can come to [facility] 12/19/21 at 9:00am

## 2021-12-19 ENCOUNTER — Encounter (HOSPITAL_BASED_OUTPATIENT_CLINIC_OR_DEPARTMENT_OTHER): Payer: Self-pay | Admitting: Orthopaedic Surgery

## 2021-12-19 LAB — ALLERGEN PROFILE, FOOD-CITRUS
Allergen Grapefruit IgE: 0.37 kU/L — AB
Allergen Lime IgE: <0.1 kU/L
Lemon: 0.55 kU/L — AB
Orange: 0.27 kU/L — AB
Tangerine IgE: 0.15 kU/L — AB

## 2021-12-19 NOTE — Progress Notes (Signed)
Patient came in for new post op new brace after reporting his original one broke. I fitted his new brace with the same settings as old brace.   He reported some discomfort on the lateral aspect of his knee after excessive use at school yesterday while utilizing one crutch since his old brace broke. I advised the patient to go back to using two crutches with the new brace to see if that helps with pain as force will be dissipated correctly again with new brace.   Patient agreed to this recommendation and stated the new brace and two crutch assisted gait eliminated the pain and felt "much better". I advised him to speak with PT about WB progression within protocol now that discomfort with ADL's has been addressed.

## 2021-12-20 NOTE — Progress Notes (Signed)
Called and informed patient of results and provider instructions. Patient voiced understanding.

## 2021-12-21 ENCOUNTER — Encounter (HOSPITAL_BASED_OUTPATIENT_CLINIC_OR_DEPARTMENT_OTHER): Payer: Self-pay | Admitting: Physical Therapy

## 2021-12-21 ENCOUNTER — Other Ambulatory Visit: Payer: Self-pay

## 2021-12-21 ENCOUNTER — Ambulatory Visit (HOSPITAL_BASED_OUTPATIENT_CLINIC_OR_DEPARTMENT_OTHER): Payer: No Typology Code available for payment source | Admitting: Physical Therapy

## 2021-12-21 DIAGNOSIS — M25662 Stiffness of left knee, not elsewhere classified: Secondary | ICD-10-CM

## 2021-12-21 DIAGNOSIS — M25562 Pain in left knee: Secondary | ICD-10-CM

## 2021-12-21 DIAGNOSIS — M6281 Muscle weakness (generalized): Secondary | ICD-10-CM

## 2021-12-21 DIAGNOSIS — R262 Difficulty in walking, not elsewhere classified: Secondary | ICD-10-CM

## 2021-12-21 NOTE — Therapy (Signed)
OUTPATIENT PHYSICAL THERAPY TREATMENT NOTE   Patient Name: Dustin Parks MRN: [id number] DOB:05/13/2001, 21 y.o., male Today's Date: 12/21/2021  PCP: Libby Maw, MD REFERRING PROVIDER: Dr Donivan Scull    PT End of Session - 12/21/21 1641     Visit Number 7    Number of Visits 32    Date for PT Re-Evaluation 02/05/22    Progress Note Due on Visit --   01/11/2022   PT Start Time 1645    PT Stop Time 1730    PT Time Calculation (min) 45 min    Equipment Utilized During Treatment Other (comment)   crutches and brace   Activity Tolerance Patient tolerated treatment well    Behavior During Therapy WFL for tasks assessed/performed               Past Medical History:  Diagnosis Date   Asthma    out grown now    Past Surgical History:  Procedure Laterality Date   KNEE ARTHROSCOPY WITH ANTERIOR CRUCIATE LIGAMENT (ACL) REPAIR WITH HAMSTRING GRAFT Left 11/08/2021   Procedure: LEFT KNEE ANTERIOR CRUCIATE LIGAMENT RECONSTRUCTION WITH QUADRICEPS TENDON AUTOGRAFT;  Surgeon: [name], MD;  Location: [place];  Service: Orthopedics;  Laterality: Left;   KNEE ARTHROSCOPY WITH MENISCAL REPAIR  11/08/2021   Procedure: LEFT LATERAL MENISCAL REPAIR;  Surgeon: [name], MD;  Location: [place];  Service: Orthopedics;;   [place] EXTRACTION  2022   Patient Active Problem List   Diagnosis Date Noted   Allergic reaction 12/13/2021   Left anterior cruciate ligament tear    Complex tear of lateral meniscus of left knee as current injury    Somatic dysfunction of spine, cervical 10/05/2021   Adjustment disorder 09/26/2021   Encounter for vasectomy counseling 09/26/2021   Neck pain 09/26/2021   History of orchitis 02/08/2021     PCP: Libby Maw, MD   REFERRING PROVIDER: [name], MD   REFERRING DIAG: [phone] (ICD-10-CM) - Rupture of anterior cruciate ligament of left knee, initial encounter        S83.272A (ICD-10-CM) - Complex tear of lateral meniscus of left knee as current injury, initial encounter      Z98.890 (ICD-10-CM) - S/P left knee arthroscopy    THERAPY DIAG:  Left knee pain, unspecified chronicity   Stiffness of left knee, not elsewhere classified   Muscle weakness (generalized)   Difficulty in walking, not elsewhere classified   ONSET DATE: Injury 10/09/2021 / Surgery 11/08/2021   SUBJECTIVE:    SUBJECTIVE STATEMENT: -Pt is 6 weeks as/p Left knee ACL reconstruction with quadriceps autograft and lateral meniscal repair.  Pt has been ambulating with bilat crutches mainly but has been using 1 crutch some. Pt states that the broken brace possibly regressed some with the flexion ROM. Bending the knee has definitely gotten a little worse.     PERTINENT HISTORY: 11/08/2021 Left knee ACL reconstruction with quadriceps autograft and lateral meniscal repair.  Allergy to certain adhesives including tegaderm per pt report    PAIN:  PAIN:  Are you having pain? No VAS scale: 0/10 current, 3/10 worst pain Pain location: right lateral knee  Pain orientation: Right  PAIN TYPE: aching Pain description: intermittent  Aggravating factors: use of the knee / falling off the bed  Relieving factors: rest     PRECAUTIONS: Other: per Dr. Eddie Dibbles ACL reconstruction with meniscal repair   WEIGHT BEARING RESTRICTIONS Yes      OCCUPATION: in culinary school  PLOF: Independent; Pt was able to perform all of his ADLs/IADLs and functional mobility skills without difficulty or limitations.  Pt was able to perform work activities.  Pt ambulated without an AD with good stability without difficulty.    PATIENT GOALS improved stability with leaning, to have the ability to run, to be able to bowl.  Return to PLOF.      OBJECTIVE:   0 deg ext 75 deg flexion      TODAY'S TREATMENT:  Therapeutic Exercise:  Heel slide with strap 10x 10s Seated ankle DF stretch 5s 10x T[place]gate knee  flexion self stretch 5s 10x NMES with SLR 10/10 on off, 2s ramp, 55 mA, 50% duty cycle 15 min Heel toe rocking 20x  Gait training: VC equal weight shift, increasing stance time, and heel strike and toe off in brace unlocked; 39f, no crutches,  Brace usage, unlocking of brace, NMES, AD usage outdoors or for long duration walks       PATIENT EDUCATION:  Education details:  protocol, acceptable levels of discomfort, use of NMES at home, NMES vs TENS, anatomy, exercise progression, muscle firing,  envelope of function, HEP, POC  Person educated: Patient Education method: Explanation, Demonstration, Tactile cues, Verbal cues, and Handouts Education comprehension: verbalized understanding, returned demonstration, verbal cues required, tactile cues required, and needs further education     HOME EXERCISE PROGRAM: Access Code: [email or web address]: [email or web address] Date: 11/13/2021 Prepared by: TRonny Flurry  Exercises Supine Quadricep Sets - 2 x daily - 7 x weekly - 2 sets - 10 reps - 5 seconds hold ANKLE PUMPS - 3 x daily - 7 x weekly - 3 sets - 10 reps     ASSESSMENT:   CLINICAL IMPRESSION: Pt able to continue with progression of ACL protocol at today's session. Pt with continued difficulty with  flexion ROM and quadriceps contraction endurance- was able to perform SLR with minor extensor lag with [email or web address]. Pt was able to perform gait training exercise at today's session with FWB as per protocol. Pt with apprehension and requires reinforcement throughout.  Pt was able to progress to FWB in brace at end of session without pain or discomfort. Plan to continue with flexion ROM, quad strength, and gait training to began taper of brace and AD ASAP. Pt is 6 weeks at this time.   Pt should continue to benefit from cont skilled PT services per protocol to address ongoing goals and to restore PLOF.        Objective impairments include Abnormal gait, decreased activity tolerance,  decreased balance, decreased mobility, difficulty walking, decreased ROM, decreased strength, hypomobility, increased edema, impaired flexibility, and pain. These impairments are limiting patient from cleaning, community activity, driving, meal prep, occupation, laundry, shopping, school, and ambulation .     REHAB POTENTIAL: Good   CLINICAL DECISION MAKING: Stable/uncomplicated   EVALUATION COMPLEXITY: Low     GOALS:     SHORT TERM GOALS:   STG Name Target Date Goal status  1 Pt will be independent and compliant with HEP for improved pain, strength, and ROM.  Baseline:  12/11/2021 PARTIALLY MET  2 Pt will demo a good quad set and perform supine SLRs independently with brace for improved quad strength. Baseline:  11/27/2021 PROGRESSING  3 Pt will demo improved improved L knee extension PROM/AROM to 0/3 and flexion PROM to 60 deg for improved mobility and stiffness. Baseline: 12/04/2021 GOAL MET  4 Pt will demo improved improved L knee extension AROM to 0 deg and  flexion AAROM/PROM to 80/90 deg for improved mobility and stiffness. Baseline: 12/18/2021 PARTIALLY MET  5 Pt will demo L knee AROM to be 0 - 120 deg for improved stiffness and performance of functional mobility.   Baseline: 01/22/2022 INITIAL  6 Pt will progress Wb'ing with gait per MD orders and protocol without adverse effects.  Baseline: 12/18/2021 PROGRESSING  7 Pt will progress with closed chain exercises per protocol without adverse effects for improved functional strength and stability.  Baseline: 12/25/2021 INITIAL  8 Pt will score 0-1 on the lateral step down test on a 4 inch step for improved quad eccentric control.   01/22/2022   INITIAL  9 Pt will score 0-1 on the lateral step down test on a 6 inch step for improved quad eccentric control and performance of stairs.   02/06/2022 INITIAL  10 Pt will ambulate with a normalized heel to toe gait without limping without AD 01/22/2022 INITIAL    LONG TERM GOALS:    LTG Name  Target Date Goal status  1 Pt will be able to perform his ADLs/IADLs and normal functional mobility skills without significant difficulty or pain.  Baseline: 03/05/2022   INITIAL  2 Pt will ambulate extended community distance without an AD without increased pain or difficulty.  Baseline: 02/19/2022 INITIAL  3 Pt will be able to perform his normal daily transfers without difficulty.  Baseline: 02/05/2022 INITIAL  4 Pt will demo good squatting form, symmetrical Wb'ing, and good knee alignment without significant pain for improved functional strength and tolerance with IADLs.  Baseline: 02/19/2022 INITIAL  5 Pt will be able to perform stairs with a reciprocal gait without the rail with good control.  Baseline: 03/05/2022 INITIAL  6 Pt will be able to perform all of his school activities and work activities without significant pain or difficulty.   Baseline: 02/19/2022 INITIAL  7 Pt will demo 5/5 L hip and knee strength for improved tolerance with and performance of functional mobility skills and to assist in returning to PLOF.   Baseline: 03/05/2022 INITIAL    PLAN: PT FREQUENCY: 2x/week   PT DURATION: other: 10-12 weeks   PLANNED INTERVENTIONS: Therapeutic exercises, Therapeutic activity, Neuro Muscular re-education, Balance training, Gait training, Patient/Family education, Joint mobilization, Stair training, DME instructions, Aquatic Therapy, Electrical stimulation, Cryotherapy, Moist heat, Taping, and Manual therapy   PLAN FOR NEXT SESSION: Cont per Dr. Eddie Dibbles ACL reconstruction with meniscal repair.  Able to FWB, progress flexion.   Allergy to certain adhesives including tegaderm.     Daleen Bo PT, DPT 12/21/21 5:53 PM

## 2021-12-27 ENCOUNTER — Other Ambulatory Visit: Payer: Self-pay

## 2021-12-27 ENCOUNTER — Other Ambulatory Visit (HOSPITAL_BASED_OUTPATIENT_CLINIC_OR_DEPARTMENT_OTHER): Payer: Self-pay | Admitting: Orthopaedic Surgery

## 2021-12-27 ENCOUNTER — Telehealth (HOSPITAL_BASED_OUTPATIENT_CLINIC_OR_DEPARTMENT_OTHER): Payer: Self-pay | Admitting: Orthopaedic Surgery

## 2021-12-27 ENCOUNTER — Ambulatory Visit (HOSPITAL_BASED_OUTPATIENT_CLINIC_OR_DEPARTMENT_OTHER): Payer: No Typology Code available for payment source | Admitting: Physical Therapy

## 2021-12-27 DIAGNOSIS — M6281 Muscle weakness (generalized): Secondary | ICD-10-CM

## 2021-12-27 DIAGNOSIS — R262 Difficulty in walking, not elsewhere classified: Secondary | ICD-10-CM

## 2021-12-27 DIAGNOSIS — M25562 Pain in left knee: Secondary | ICD-10-CM

## 2021-12-27 DIAGNOSIS — M25662 Stiffness of left knee, not elsewhere classified: Secondary | ICD-10-CM

## 2021-12-27 NOTE — Therapy (Signed)
OUTPATIENT PHYSICAL THERAPY TREATMENT NOTE   Patient Name: Dustin Parks MRN: [id number] DOB:December 03, 2000, 21 y.o., male Today's Date: 12/28/2021  PCP: Libby Maw, MD REFERRING PROVIDER: Dr Donivan Scull    PT End of Session - 12/27/21 1519     Visit Number 8    Number of Visits 32    Date for PT Re-Evaluation 02/05/22    Progress Note Due on Visit --   01/11/2022   PT Start Time 1444    PT Stop Time 1518    PT Time Calculation (min) 34 min    Equipment Utilized During Treatment Other (comment)   bilat crutches and brace   Activity Tolerance Patient tolerated treatment well    Behavior During Therapy WFL for tasks assessed/performed                Past Medical History:  Diagnosis Date   Asthma    out grown now    Past Surgical History:  Procedure Laterality Date   KNEE ARTHROSCOPY WITH ANTERIOR CRUCIATE LIGAMENT (ACL) REPAIR WITH HAMSTRING GRAFT Left 11/08/2021   Procedure: LEFT KNEE ANTERIOR CRUCIATE LIGAMENT RECONSTRUCTION WITH QUADRICEPS TENDON AUTOGRAFT;  Surgeon: [name], MD;  Location: [facility];  Service: Orthopedics;  Laterality: Left;   KNEE ARTHROSCOPY WITH MENISCAL REPAIR  11/08/2021   Procedure: LEFT LATERAL MENISCAL REPAIR;  Surgeon: [name], MD;  Location: [place];  Service: Orthopedics;;   [place] EXTRACTION  2022   Patient Active Problem List   Diagnosis Date Noted   Allergic reaction 12/13/2021   Left anterior cruciate ligament tear    Complex tear of lateral meniscus of left knee as current injury    Somatic dysfunction of spine, cervical 10/05/2021   Adjustment disorder 09/26/2021   Encounter for vasectomy counseling 09/26/2021   Neck pain 09/26/2021   History of orchitis 02/08/2021     PCP: Libby Maw, MD   REFERRING PROVIDER: [name], MD   REFERRING DIAG: [phone] (ICD-10-CM) - Rupture of anterior cruciate ligament of left knee, initial encounter        S83.272A (ICD-10-CM) - Complex tear of lateral meniscus of left knee as current injury, initial encounter      Z98.890 (ICD-10-CM) - S/P left knee arthroscopy    THERAPY DIAG:  Left knee pain, unspecified chronicity   Stiffness of left knee, not elsewhere classified   Muscle weakness (generalized)   Difficulty in walking, not elsewhere classified   ONSET DATE: Injury 10/09/2021 / Surgery 11/08/2021   SUBJECTIVE:    SUBJECTIVE STATEMENT: -Pt is 7 weeks as/p Left knee ACL reconstruction with quadriceps autograft and lateral meniscal repair.  Pt has been ambulating with bilat crutches mainly but has been using 1 crutch some. Pt states that the broken brace possibly regressed some with the flexion ROM. Bending the knee has definitely gotten a little worse.  Pt has been increasing ambulation distance without crutches and walked into clinic without crutches.  Pt reports he is not having pain ambulating short distance without crutches though is fatigued.  Pt reports he helped his grandfather lift a couch on the truck.  Pt states he has worked on his knee bend but not been compliant with HEP.    PERTINENT HISTORY: 11/08/2021 Left knee ACL reconstruction with quadriceps autograft and lateral meniscal repair.  Allergy to certain adhesives including tegaderm per pt report    PAIN:  PAIN:  Are you having pain? No VAS scale: 0/10 current, 3/10 worst  pain Pain location: right lateral knee  Pain orientation: Right  PAIN TYPE: aching Pain description: intermittent  Aggravating factors: use of the knee / falling off the bed  Relieving factors: rest     PRECAUTIONS: Other: per Dr. Eddie Dibbles ACL reconstruction with meniscal repair   WEIGHT BEARING RESTRICTIONS Yes      OCCUPATION: in culinary school     PLOF: Independent; Pt was able to perform all of his ADLs/IADLs and functional mobility skills without difficulty or limitations.  Pt was able to perform work activities.  Pt ambulated without an  AD with good stability without difficulty.    PATIENT GOALS improved stability with leaning, to have the ability to run, to be able to bowl.  Return to PLOF.      OBJECTIVE:   0 deg ext 75 deg flexion      TODAY'S TREATMENT:  Pt has extensor lag with supine SLR.    L knee flexion AAROM:  72 deg   Therapeutic Exercise: Heel slide with strap 2x10 reps Tailgate knee flexion self stretch 5-10s 10x Supine SLR with PT assist 2x10 reps -Pt received 4D patellar mobs  -Pt received L knee flex and ext PROM in supine per pt tolerance and tissue tolerance.. -Pt received L knee flexion PROM seated at EOT per pt tolerance and tissue tolerance.     PATIENT EDUCATION:  Education details:  Educated pt in being compliant with HEP. PT instructed pt he needs to perform his knee ROM exercises 3 times per day.  protocol, acceptable levels of discomfort, HEP, POC.  Person educated: Patient Education method: Explanation, Demonstration, Tactile cues, Verbal cues, and Handouts Education comprehension: verbalized understanding, returned demonstration, verbal cues required, tactile cues required, and needs further education     HOME EXERCISE PROGRAM: Access Code: [email or web address] URL: [email or web address] Date: 11/13/2021 Prepared by: Ronny Flurry        ASSESSMENT:   CLINICAL IMPRESSION: Pt continues to have tightness and limitations with L knee flexion ROM.  He is limited with flexion ROM due to pain and tightness.  He had pain with PROM.  Pt has not been completely consistent with HEP.  PT instructed Pt to be complaint with HEP and to increase frequency of stretches/ROM in order to improve flexion ROM.  Pt is feeling better ambulating with new brace and bilat crutches.   Pt continues to have deficits with quad activation as evidenced by extensor lag with supine SLR.  Unable to perform NMES due to clinic being out of e-stim pads.  Pt should benefit from cont skilled PT services per  protocol to address ongoing goals and to restore PLOF.   Objective impairments include Abnormal gait, decreased activity tolerance, decreased balance, decreased mobility, difficulty walking, decreased ROM, decreased strength, hypomobility, increased edema, impaired flexibility, and pain. These impairments are limiting patient from cleaning, community activity, driving, meal prep, occupation, laundry, shopping, school, and ambulation .     REHAB POTENTIAL: Good   CLINICAL DECISION MAKING: Stable/uncomplicated   EVALUATION COMPLEXITY: Low     GOALS:     SHORT TERM GOALS:   STG Name Target Date Goal status  1 Pt will be independent and compliant with HEP for improved pain, strength, and ROM.  Baseline:  12/11/2021 PARTIALLY MET  2 Pt will demo a good quad set and perform supine SLRs independently with brace for improved quad strength. Baseline:  11/27/2021 PROGRESSING  3 Pt will demo improved improved L knee extension PROM/AROM to 0/3 and flexion PROM to 60  for improved mobility and stiffness. °Baseline: 12/04/2021 GOAL MET  °4 Pt will demo improved improved L knee extension AROM to 0 deg and  flexion AAROM/PROM to 80/90 deg for improved mobility and stiffness. °Baseline: 12/18/2021 PARTIALLY MET  °5 Pt will demo L knee AROM to be 0 - 120 deg for improved stiffness and performance of functional mobility.   °Baseline: 01/22/2022 INITIAL  °6 Pt will progress Wb'ing with gait per MD orders and protocol without adverse effects.  °Baseline: 12/18/2021 PROGRESSING  °7 Pt will progress with closed chain exercises per protocol without adverse effects for improved functional strength and stability.  °Baseline: 12/25/2021 INITIAL  °8 Pt will score 0-1 on the lateral step down test on a 4 inch step for improved quad eccentric control.   01/22/2022 °  INITIAL  °9 Pt will score 0-1 on the lateral step down test on a 6 inch step for improved quad eccentric control and performance of stairs.   02/06/2022 INITIAL  °10  Pt will ambulate with a normalized heel to toe gait without limping without AD 01/22/2022 INITIAL  °  °LONG TERM GOALS:  °  °LTG Name Target Date Goal status  °1 Pt will be able to perform his ADLs/IADLs and normal functional mobility skills without significant difficulty or pain.  °Baseline: 03/05/2022 °  INITIAL  °2 Pt will ambulate extended community distance without an AD without increased pain or difficulty.  °Baseline: 02/19/2022 INITIAL  °3 Pt will be able to perform his normal daily transfers without difficulty.  °Baseline: 02/05/2022 INITIAL  °4 Pt will demo good squatting form, symmetrical Wb'ing, and good knee alignment without significant pain for improved functional strength and tolerance with IADLs.  °Baseline: 02/19/2022 INITIAL  °5 Pt will be able to perform stairs with a reciprocal gait without the rail with good control.  °Baseline: 03/05/2022 INITIAL  °6 Pt will be able to perform all of his school activities and work activities without significant pain or difficulty.   °Baseline: 02/19/2022 INITIAL  °7 Pt will demo 5/5 L hip and knee strength for improved tolerance with and performance of functional mobility skills and to assist in returning to PLOF.   °Baseline: 03/05/2022 INITIAL  °  °PLAN: °PT FREQUENCY: 2x/week °  °PT DURATION: other: 10-12 weeks °  °PLANNED INTERVENTIONS: Therapeutic exercises, Therapeutic activity, Neuro Muscular re-education, Balance training, Gait training, Patient/Family education, Joint mobilization, Stair training, DME instructions, Aquatic Therapy, Electrical stimulation, Cryotherapy, Moist heat, Taping, and Manual therapy °  °PLAN FOR NEXT SESSION: Cont per Dr. Bokshan's ACL reconstruction with meniscal repair.  Able to FWB, progress flexion.   Allergy to certain adhesives including tegaderm.  °  ° °Roby Harrison III PT, DPT °12/28/21 1:40 PM ° ° ° ° ° °   °

## 2021-12-27 NOTE — Telephone Encounter (Signed)
Rescheduled

## 2021-12-28 ENCOUNTER — Encounter (HOSPITAL_BASED_OUTPATIENT_CLINIC_OR_DEPARTMENT_OTHER): Payer: No Typology Code available for payment source | Admitting: Orthopaedic Surgery

## 2021-12-28 ENCOUNTER — Ambulatory Visit (HOSPITAL_BASED_OUTPATIENT_CLINIC_OR_DEPARTMENT_OTHER): Payer: No Typology Code available for payment source | Admitting: Physical Therapy

## 2021-12-28 ENCOUNTER — Encounter (HOSPITAL_BASED_OUTPATIENT_CLINIC_OR_DEPARTMENT_OTHER): Payer: Self-pay | Admitting: Physical Therapy

## 2021-12-28 DIAGNOSIS — M25662 Stiffness of left knee, not elsewhere classified: Secondary | ICD-10-CM

## 2021-12-28 DIAGNOSIS — M6281 Muscle weakness (generalized): Secondary | ICD-10-CM

## 2021-12-28 DIAGNOSIS — M25562 Pain in left knee: Secondary | ICD-10-CM | POA: Diagnosis not present

## 2021-12-28 DIAGNOSIS — R262 Difficulty in walking, not elsewhere classified: Secondary | ICD-10-CM

## 2021-12-28 NOTE — Therapy (Signed)
OUTPATIENT PHYSICAL THERAPY TREATMENT NOTE   Patient Name: Dustin Parks MRN: [id number] DOB:29-Sep-2001, 21 y.o., male Today's Date: 12/28/2021  PCP: Libby Maw, MD REFERRING PROVIDER: Dr Donivan Scull    PT End of Session - 12/28/21 1401     Visit Number 9    Number of Visits 32    Date for PT Re-Evaluation 02/05/22    Progress Note Due on Visit --   01/11/2022   PT Start Time 1355    PT Stop Time 1435    PT Time Calculation (min) 40 min    Activity Tolerance Patient tolerated treatment well    Behavior During Therapy WFL for tasks assessed/performed                Past Medical History:  Diagnosis Date   Asthma    out grown now    Past Surgical History:  Procedure Laterality Date   KNEE ARTHROSCOPY WITH ANTERIOR CRUCIATE LIGAMENT (ACL) REPAIR WITH HAMSTRING GRAFT Left 11/08/2021   Procedure: LEFT KNEE ANTERIOR CRUCIATE LIGAMENT RECONSTRUCTION WITH QUADRICEPS TENDON AUTOGRAFT;  Surgeon: [name], MD;  Location: [facility];  Service: Orthopedics;  Laterality: Left;   KNEE ARTHROSCOPY WITH MENISCAL REPAIR  11/08/2021   Procedure: LEFT LATERAL MENISCAL REPAIR;  Surgeon: [name], MD;  Location: [facility];  Service: Orthopedics;;   [place] EXTRACTION  2022   Patient Active Problem List   Diagnosis Date Noted   Allergic reaction 12/13/2021   Left anterior cruciate ligament tear    Complex tear of lateral meniscus of left knee as current injury    Somatic dysfunction of spine, cervical 10/05/2021   Adjustment disorder 09/26/2021   Encounter for vasectomy counseling 09/26/2021   Neck pain 09/26/2021   History of orchitis 02/08/2021     PCP: Libby Maw, MD   REFERRING PROVIDER: [name], MD   REFERRING DIAG: [phone] (ICD-10-CM) - Rupture of anterior cruciate ligament of left knee, initial encounter       S83.272A (ICD-10-CM) - Complex tear of lateral meniscus of left knee as current  injury, initial encounter      Z98.890 (ICD-10-CM) - S/P left knee arthroscopy    THERAPY DIAG:  Left knee pain, unspecified chronicity   Stiffness of left knee, not elsewhere classified   Muscle weakness (generalized)   Difficulty in walking, not elsewhere classified   ONSET DATE: Injury 10/09/2021 / Surgery 11/08/2021   SUBJECTIVE:    SUBJECTIVE STATEMENT: -Pt is 7 weeks and 1 day s/p Left knee ACL reconstruction with quadriceps autograft and lateral meniscal repair.  Pt has been ambulating with bilat crutches mainly but has been using 1 crutch some.  Pt  Pt has been increasing ambulation distance without crutches and walked into clinic without crutches.  Pt reports he is not having pain ambulating short distance without crutches though is fatigued.  Pt states he has worked on his knee bend but not been compliant with HEP.  Pt denies any adverse effects after prior Rx.  Pt states he helped his grandfather move couches again today.  Pt reports 1/10 pain after helping grandfather.  He hasn't performed his stretching yet today.      PERTINENT HISTORY: 11/08/2021 Left knee ACL reconstruction with quadriceps autograft and lateral meniscal repair.  Allergy to certain adhesives including tegaderm per pt report    PAIN:  PAIN:  Are you having pain? No VAS scale: 0/10 current, 3/10 worst pain Pain location: right lateral knee  Pain orientation: Right  PAIN TYPE: aching Pain description: intermittent  Aggravating factors: use of the knee / falling off the bed  Relieving factors: rest     PRECAUTIONS: Other: per Dr. Eddie Dibbles ACL reconstruction with meniscal repair   WEIGHT BEARING RESTRICTIONS Yes      OCCUPATION: in culinary school     PLOF: Independent; Pt was able to perform all of his ADLs/IADLs and functional mobility skills without difficulty or limitations.  Pt was able to perform work activities.  Pt ambulated without an AD with good stability without difficulty.    PATIENT  GOALS improved stability with leaning, to have the ability to run, to be able to bowl.  Return to PLOF.      OBJECTIVE:   0 deg ext 75 deg flexion      TODAY'S TREATMENT:  Pt has extensor lag with supine SLR.    L knee extension AROM:  0 deg  L knee flexion AAROM:  69 deg L knee flexion PROM:  74 deg   Therapeutic Exercise: Reviewed current function, HEP compliance, response to prior Rx, and pain level. Pt performed: -Heel slide with strap 2x10 reps -Tailgate knee flexion self stretch 5-10s 10x -Supine SLR x 10 reps with don joy brace locked x 10 reps and with PT assist 2x10 reps -supine knee flexion with p-ball, SL and bilat to improve ROM -Pt received 4D patellar mobs  -Pt received L knee flex and ext PROM in supine per pt tolerance and tissue tolerance. -Pt received L knee flexion PROM seated at EOT per pt tolerance and tissue tolerance. -Bilat Heel Raises 2x10 reps -TKE with RTB 2x10 reps     PATIENT EDUCATION:  Education details:  Educated pt in being compliant with HEP. PT instructed pt he needs to perform his knee ROM exercises 3 times per day.  protocol, acceptable levels of discomfort, HEP, POC.  Instructed to not lift and move couches. Person educated: Patient Education method: Explanation, Demonstration, Tactile cues, Verbal cues, and Handouts Education comprehension: verbalized understanding, returned demonstration, verbal cues required, tactile cues required, and needs further education     HOME EXERCISE PROGRAM: Access Code: [email or web address] URL: [email or web address] Date: 11/13/2021 Prepared by: Ronny Flurry        ASSESSMENT:   CLINICAL IMPRESSION: Pt entered the clinic without crutches with brace.  Pt continues to have tightness and limitations with L knee flexion ROM.  He is limited with flexion ROM due to pain and tightness.  Pt has full knee extension ROM.  He had pain with PROM and requires cuing to not compensate for knee flexion ROM  with hip hike.  Pt has not been completely consistent with HEP.  PT instructed Pt to be complaint with HEP and to increase frequency of stretches/ROM in order to improve flexion ROM.  Pt continues to have deficits with quad activation as evidenced by extensor lag with supine SLR.  Added light closed chain exercises per protocol today and Pt performed well without c/o's.  Unable to perform NMES due to clinic being out of e-stim pads.  Pt had no c/o's after Rx.  Pt should benefit from cont skilled PT services per protocol to address ongoing goals and to restore PLOF.      Objective impairments include Abnormal gait, decreased activity tolerance, decreased balance, decreased mobility, difficulty walking, decreased ROM, decreased strength, hypomobility, increased edema, impaired flexibility, and pain. These impairments are limiting patient from cleaning, community activity, driving, meal prep, occupation, laundry, shopping, school, and ambulation .  REHAB POTENTIAL: Good   CLINICAL DECISION MAKING: Stable/uncomplicated   EVALUATION COMPLEXITY: Low     GOALS:     SHORT TERM GOALS:   STG Name Target Date Goal status  1 Pt will be independent and compliant with HEP for improved pain, strength, and ROM.  Baseline:  12/11/2021 PARTIALLY MET  2 Pt will demo a good quad set and perform supine SLRs independently with brace for improved quad strength. Baseline:  11/27/2021 PROGRESSING  3 Pt will demo improved improved L knee extension PROM/AROM to 0/3 and flexion PROM to 60 deg for improved mobility and stiffness. Baseline: 12/04/2021 GOAL MET  4 Pt will demo improved improved L knee extension AROM to 0 deg and  flexion AAROM/PROM to 80/90 deg for improved mobility and stiffness. Baseline: 12/18/2021 PARTIALLY MET  5 Pt will demo L knee AROM to be 0 - 120 deg for improved stiffness and performance of functional mobility.   Baseline: 01/22/2022 INITIAL  6 Pt will progress Wb'ing with gait per MD orders  and protocol without adverse effects.  Baseline: 12/18/2021 PROGRESSING  7 Pt will progress with closed chain exercises per protocol without adverse effects for improved functional strength and stability.  Baseline: 12/25/2021 INITIAL  8 Pt will score 0-1 on the lateral step down test on a 4 inch step for improved quad eccentric control.   01/22/2022   INITIAL  9 Pt will score 0-1 on the lateral step down test on a 6 inch step for improved quad eccentric control and performance of stairs.   02/06/2022 INITIAL  10 Pt will ambulate with a normalized heel to toe gait without limping without AD 01/22/2022 INITIAL    LONG TERM GOALS:    LTG Name Target Date Goal status  1 Pt will be able to perform his ADLs/IADLs and normal functional mobility skills without significant difficulty or pain.  Baseline: 03/05/2022   INITIAL  2 Pt will ambulate extended community distance without an AD without increased pain or difficulty.  Baseline: 02/19/2022 INITIAL  3 Pt will be able to perform his normal daily transfers without difficulty.  Baseline: 02/05/2022 INITIAL  4 Pt will demo good squatting form, symmetrical Wb'ing, and good knee alignment without significant pain for improved functional strength and tolerance with IADLs.  Baseline: 02/19/2022 INITIAL  5 Pt will be able to perform stairs with a reciprocal gait without the rail with good control.  Baseline: 03/05/2022 INITIAL  6 Pt will be able to perform all of his school activities and work activities without significant pain or difficulty.   Baseline: 02/19/2022 INITIAL  7 Pt will demo 5/5 L hip and knee strength for improved tolerance with and performance of functional mobility skills and to assist in returning to PLOF.   Baseline: 03/05/2022 INITIAL    PLAN: PT FREQUENCY: 2x/week   PT DURATION: other: 10-12 weeks   PLANNED INTERVENTIONS: Therapeutic exercises, Therapeutic activity, Neuro Muscular re-education, Balance training, Gait training, Patient/Family  education, Joint mobilization, Stair training, DME instructions, Aquatic Therapy, Electrical stimulation, Cryotherapy, Moist heat, Taping, and Manual therapy   PLAN FOR NEXT SESSION: Cont per Dr. Eddie Dibbles ACL reconstruction with meniscal repair.  Able to FWB.  Work on flexion ROM.   Allergy to certain adhesives including tegaderm.     Selinda Michaels III PT, DPT 12/28/21 10:51 PM

## 2022-01-03 ENCOUNTER — Encounter (HOSPITAL_BASED_OUTPATIENT_CLINIC_OR_DEPARTMENT_OTHER): Payer: Self-pay | Admitting: Physical Therapy

## 2022-01-03 ENCOUNTER — Ambulatory Visit (HOSPITAL_BASED_OUTPATIENT_CLINIC_OR_DEPARTMENT_OTHER): Payer: No Typology Code available for payment source | Attending: Orthopaedic Surgery | Admitting: Physical Therapy

## 2022-01-03 ENCOUNTER — Encounter (HOSPITAL_BASED_OUTPATIENT_CLINIC_OR_DEPARTMENT_OTHER): Payer: No Typology Code available for payment source | Admitting: Orthopaedic Surgery

## 2022-01-03 DIAGNOSIS — M25662 Stiffness of left knee, not elsewhere classified: Secondary | ICD-10-CM | POA: Insufficient documentation

## 2022-01-03 DIAGNOSIS — M25562 Pain in left knee: Secondary | ICD-10-CM | POA: Insufficient documentation

## 2022-01-03 DIAGNOSIS — M6281 Muscle weakness (generalized): Secondary | ICD-10-CM | POA: Insufficient documentation

## 2022-01-03 DIAGNOSIS — R262 Difficulty in walking, not elsewhere classified: Secondary | ICD-10-CM | POA: Insufficient documentation

## 2022-01-05 ENCOUNTER — Encounter (HOSPITAL_BASED_OUTPATIENT_CLINIC_OR_DEPARTMENT_OTHER): Payer: Self-pay | Admitting: Physical Therapy

## 2022-01-05 ENCOUNTER — Ambulatory Visit (HOSPITAL_BASED_OUTPATIENT_CLINIC_OR_DEPARTMENT_OTHER): Payer: No Typology Code available for payment source | Admitting: Physical Therapy

## 2022-01-05 ENCOUNTER — Other Ambulatory Visit: Payer: Self-pay

## 2022-01-05 ENCOUNTER — Ambulatory Visit (INDEPENDENT_AMBULATORY_CARE_PROVIDER_SITE_OTHER): Payer: No Typology Code available for payment source | Admitting: Orthopaedic Surgery

## 2022-01-05 DIAGNOSIS — M25562 Pain in left knee: Secondary | ICD-10-CM

## 2022-01-05 DIAGNOSIS — R262 Difficulty in walking, not elsewhere classified: Secondary | ICD-10-CM

## 2022-01-05 DIAGNOSIS — M25662 Stiffness of left knee, not elsewhere classified: Secondary | ICD-10-CM

## 2022-01-05 DIAGNOSIS — S83512D Sprain of anterior cruciate ligament of left knee, subsequent encounter: Secondary | ICD-10-CM

## 2022-01-05 DIAGNOSIS — M6281 Muscle weakness (generalized): Secondary | ICD-10-CM | POA: Diagnosis present

## 2022-01-05 NOTE — Progress Notes (Signed)
? ?                            ? ? ?Chief Complaint: Postop follow-up ?  ? ?Left knee ACL reconstruction with quadriceps tendon autograft and lateral meniscal repair done January 4th, 2023 ? ?History of Present Illness:  ? ?01/05/2022: Dustin Parks presents today with follow-up for his left knee.  He does state that the knee is occasionally giving out on him.  He is working on getting his quadricep strength back.  Overall his range of motion has been stuck at approximately 70 degrees at this time.  He has been able to return to weightbearing with minimal to no pain ? ?Surgical History:   ?None ? ?PMH/PSH/Family History/Social History/Meds/Allergies:   ? ?Past Medical History:  ?Diagnosis Date  ? Asthma   ? out grown now   ? ?Past Surgical History:  ?Procedure Laterality Date  ? KNEE ARTHROSCOPY WITH ANTERIOR CRUCIATE LIGAMENT (ACL) REPAIR WITH HAMSTRING GRAFT Left 11/08/2021  ? Procedure: LEFT KNEE ANTERIOR CRUCIATE LIGAMENT RECONSTRUCTION WITH QUADRICEPS TENDON AUTOGRAFT;  Surgeon: [name], MD;  Location: [facility];  Service: Orthopedics;  Laterality: Left;  ? KNEE ARTHROSCOPY WITH MENISCAL REPAIR  11/08/2021  ? Procedure: LEFT LATERAL MENISCAL REPAIR;  Surgeon: [name], MD;  Location: [facility];  Service: Orthopedics;;  ? [place] EXTRACTION  2022  ? ?Social History  ? ?Socioeconomic History  ? Marital status: Single  ?  Spouse name: Not on file  ? Number of children: Not on file  ? Years of education: Not on file  ? Highest education level: Not on file  ?Occupational History  ? Not on file  ?Tobacco Use  ? Smoking status: Never  ? Smokeless tobacco: Never  ?Vaping Use  ? Vaping Use: Never used  ?Substance and Sexual Activity  ? Alcohol use: Not Currently  ?  Comment: rare  ? Drug use: Yes  ?  Types: Marijuana  ?  Comment: social  ? Sexual activity: Yes  ?Other Topics Concern  ? Not on file  ?Social History Narrative  ? Not on file  ? ?Social Determinants of Health   ? ?Financial Resource Strain: Not on file  ?Food Insecurity: Not on file  ?Transportation Needs: Not on file  ?Physical Activity: Not on file  ?Stress: Not on file  ?Social Connections: Not on file  ? ?Family History  ?Problem Relation Age of Onset  ? Healthy Mother   ? Healthy Father   ? Healthy Maternal Grandmother   ? Healthy Paternal Grandfather   ? ?No Known Allergies ?Current Outpatient Medications  ?Medication Sig Dispense Refill  ? aspirin EC 325 MG tablet Take 1 tablet (325 mg total) by mouth daily. 30 tablet 0  ? EPINEPHrine 0.3 mg/0.3 mL IJ SOAJ injection Inject 0.3 mg into the muscle as needed for anaphylaxis. 2 each 1  ? predniSONE (DELTASONE) 10 MG tablet Take 6 tablets today, then 5 tablets tomorrow, then decrease by 1 tablet every day until gone 21 tablet 0  ? ?No current facility-administered medications for this visit.  ? ?No results found. ? ?Review of Systems:   ?A ROS was performed including pertinent positives and negatives as documented in the HPI. ? ?Physical Exam :   ?Constitutional: NAD and appears stated age ?Neurological: Alert and oriented ?Psych: Appropriate affect and cooperative ?There were no vitals taken for this visit.  ? ?Comprehensive Musculoskeletal Exam:   ?  ?  Incisions are well-healing.  He is able to achieve full extension.  Fires his quad.  There is quad atrophy with decreased tone.  Negative Lachman.  No lateral joint line tenderness.  He has some tenderness over his distal medial incision.  There is trace swelling ?Imaging:   ? ?I personally reviewed and interpreted the radiographs. ? ? ?Assessment:   ?21 year old male status post ACL reconstruction with quadriceps tendon autograft as well as lateral meniscal tear repair.  His knee feels solid on today's examination.  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  At this time I would like him to increase his physical therapy sessions to at least 3/week as he progresses his range of motion.   We did discuss the possibility of lysis of adhesions although this is very early at today's visit.  He did not have an large enough effusion but I think aspiration would significantly benefit him at this time ? ?Plan :   ? ?-Return to clinic in 2 weeks ? ? ? ? ? ?I personally saw and evaluated the patient, and participated in the management and treatment plan. ? ?[name], MD ?Attending Physician, Orthopedic Surgery ? ?This document was dictated using Systems analyst. A reasonable attempt at proof reading has been made to minimize errors. ? ?

## 2022-01-05 NOTE — Therapy (Signed)
OUTPATIENT PHYSICAL THERAPY TREATMENT NOTE   Patient Name: Dustin Parks MRN: [id number] DOB:04-11-2001, 21 y.o., male Today's Date: 01/05/2022  PCP: Libby Maw, MD REFERRING PROVIDER: Dr Donivan Scull    PT End of Session - 01/05/22 0857     Visit Number 10    Number of Visits 32    Date for PT Re-Evaluation 02/05/22    Progress Note Due on Visit --   01/11/2022   PT Start Time 0852    PT Stop Time 0937    PT Time Calculation (min) 45 min    Activity Tolerance Patient tolerated treatment well    Behavior During Therapy [facility] for tasks assessed/performed                Past Medical History:  Diagnosis Date   Asthma    out grown now    Past Surgical History:  Procedure Laterality Date   KNEE ARTHROSCOPY WITH ANTERIOR CRUCIATE LIGAMENT (ACL) REPAIR WITH HAMSTRING GRAFT Left 11/08/2021   Procedure: LEFT KNEE ANTERIOR CRUCIATE LIGAMENT RECONSTRUCTION WITH QUADRICEPS TENDON AUTOGRAFT;  Surgeon: [name], MD;  Location: [place];  Service: Orthopedics;  Laterality: Left;   KNEE ARTHROSCOPY WITH MENISCAL REPAIR  11/08/2021   Procedure: LEFT LATERAL MENISCAL REPAIR;  Surgeon: [name], MD;  Location: [place];  Service: Orthopedics;;   [place] EXTRACTION  2022   Patient Active Problem List   Diagnosis Date Noted   Allergic reaction 12/13/2021   Left anterior cruciate ligament tear    Complex tear of lateral meniscus of left knee as current injury    Somatic dysfunction of spine, cervical 10/05/2021   Adjustment disorder 09/26/2021   Encounter for vasectomy counseling 09/26/2021   Neck pain 09/26/2021   History of orchitis 02/08/2021     PCP: Libby Maw, MD   REFERRING PROVIDER: [name], MD   REFERRING DIAG: [phone] (ICD-10-CM) - Rupture of anterior cruciate ligament of left knee, initial encounter       S83.272A (ICD-10-CM) - Complex tear of lateral meniscus of left knee as current  injury, initial encounter      Z98.890 (ICD-10-CM) - S/P left knee arthroscopy    THERAPY DIAG:  Left knee pain, unspecified chronicity   Stiffness of left knee, not elsewhere classified   Muscle weakness (generalized)   Difficulty in walking, not elsewhere classified   ONSET DATE: Injury 10/09/2021 / Surgery 11/08/2021   SUBJECTIVE:    SUBJECTIVE STATEMENT: -Pt is 8 weeks and 2 days s/p Left knee ACL reconstruction with quadriceps autograft and lateral meniscal repair.  Pt has been ambulating with bilat crutches mainly but has been using 1 crutch some.  Pt  Pt has been increasing ambulation distance without crutches and walked into clinic without crutches.  Pt reports he is not having pain ambulating short distance without crutches though is fatigued.  Pt states he has worked on his knee bend but not been compliant with HEP.  Pt denies any adverse effects after prior Rx.  Pt missed last PT appt due to having a baby.  Pt states he has been working more on his knee flexion ROM being more compliant with HEP.  He states he has a sharp pain with bending knee and doesn't think it is improving.  Pt states his knee feels weaker but he thinks it is due to doing more.  Pt is not using the crutches much anymore with ambulation.   Pt states he helped his grandfather  and climbed a ladder to work on the siding.      PERTINENT HISTORY: 11/08/2021 Left knee ACL reconstruction with quadriceps autograft and lateral meniscal repair.  Allergy to certain adhesives including tegaderm per pt report    PAIN:  PAIN:  Are you having pain? No VAS scale: 1/10 current, 3/10 worst pain Pain location: right lateral knee  Pain orientation: Right  PAIN TYPE: aching Pain description: intermittent  Aggravating factors: use of the knee / falling off the bed  Relieving factors: rest     PRECAUTIONS: Other: per Dr. Eddie Dibbles ACL reconstruction with meniscal repair   WEIGHT BEARING RESTRICTIONS Yes      OCCUPATION:  in culinary school     PLOF: Independent; Pt was able to perform all of his ADLs/IADLs and functional mobility skills without difficulty or limitations.  Pt was able to perform work activities.  Pt ambulated without an AD with good stability without difficulty.    PATIENT GOALS improved stability with leaning, to have the ability to run, to be able to bowl.  Return to PLOF.      OBJECTIVE:       TODAY'S TREATMENT:    L knee extension AROM:  0 deg  L knee flexion AAROM:  69 deg L knee flexion PROM:  74 deg   Therapeutic Exercise: Reviewed current function, HEP compliance, response to prior Rx, and pain level. Pt performed: -Recumbent bike with 1/4 revolutions x 5 mins -Tailgate knee flexion self stretch 5-10s 10x -supine knee flexion with p-ball AAROM x 10 reps to improve ROM -Pt received 4D patellar mobs  -Pt received L knee flex and ext PROM in supine per pt tolerance and tissue tolerance. -Pt received L knee flexion PROM seated at EOT per pt tolerance and tissue tolerance. -Bilat Heel Raises 2x10 reps -TKE with RTB 2x10 reps   Gait Training:   Pt ambulated with brace without crutches with instruction in heel to toe gait including heel strike and toe off.  Weight shifts f/b and s/s approx 15 reps each with bilat UE assist     PATIENT EDUCATION:  Education details:  Educated pt in being compliant with HEP. PT instructed pt he needs to perform his knee ROM exercises 3 times per day.  protocol, acceptable levels of discomfort, HEP, POC.  Instructed to not lift and move couches. Person educated: Patient Education method: Explanation, Demonstration, Tactile cues, Verbal cues, and Handouts Education comprehension: verbalized understanding, returned demonstration, verbal cues required, tactile cues required, and needs further education     HOME EXERCISE PROGRAM: Access Code: [email or web address] URL: [email or web address] Date: 11/13/2021 Prepared by: Ronny Flurry         ASSESSMENT:   CLINICAL IMPRESSION: Pt entered the clinic without crutches with brace.  Pt continues to have tightness in L knee being very limited in knee flexion ROM.  Pt with no improvement in knee flexion ROM compared to prior Rx.  He has pain with L knee flexion PROM and is limited due to pain and tightness.  Pt has full knee extension ROM.  PT introduced rocking back and forth on bike to improve ROM.  Pt has not been completely consistent with HEP though states he has worked more on flexion since last Rx.  PT instructed Pt to be complaint with HEP and to increase frequency of stretches/ROM in order to improve flexion ROM.  PT worked on gait with pt including to improve knee flexion and toe off.  Pt should benefit from cont skilled PT  services per protocol to address ongoing goals and to restore PLOF.      Objective impairments include Abnormal gait, decreased activity tolerance, decreased balance, decreased mobility, difficulty walking, decreased ROM, decreased strength, hypomobility, increased edema, impaired flexibility, and pain. These impairments are limiting patient from cleaning, community activity, driving, meal prep, occupation, laundry, shopping, school, and ambulation .     REHAB POTENTIAL: Good   CLINICAL DECISION MAKING: Stable/uncomplicated   EVALUATION COMPLEXITY: Low     GOALS:     SHORT TERM GOALS:   STG Name Target Date Goal status  1 Pt will be independent and compliant with HEP for improved pain, strength, and ROM.  Baseline:  12/11/2021 PARTIALLY MET  2 Pt will demo a good quad set and perform supine SLRs independently with brace for improved quad strength. Baseline:  11/27/2021 PROGRESSING  3 Pt will demo improved improved L knee extension PROM/AROM to 0/3 and flexion PROM to 60 deg for improved mobility and stiffness. Baseline: 12/04/2021 GOAL MET  4 Pt will demo improved improved L knee extension AROM to 0 deg and  flexion AAROM/PROM to 80/90 deg for improved  mobility and stiffness. Baseline: 12/18/2021 PARTIALLY MET  5 Pt will demo L knee AROM to be 0 - 120 deg for improved stiffness and performance of functional mobility.   Baseline: 01/22/2022 INITIAL  6 Pt will progress Wb'ing with gait per MD orders and protocol without adverse effects.  Baseline: 12/18/2021 PROGRESSING  7 Pt will progress with closed chain exercises per protocol without adverse effects for improved functional strength and stability.  Baseline: 12/25/2021 INITIAL  8 Pt will score 0-1 on the lateral step down test on a 4 inch step for improved quad eccentric control.   01/22/2022   INITIAL  9 Pt will score 0-1 on the lateral step down test on a 6 inch step for improved quad eccentric control and performance of stairs.   02/06/2022 INITIAL  10 Pt will ambulate with a normalized heel to toe gait without limping without AD 01/22/2022 INITIAL    LONG TERM GOALS:    LTG Name Target Date Goal status  1 Pt will be able to perform his ADLs/IADLs and normal functional mobility skills without significant difficulty or pain.  Baseline: 03/05/2022   INITIAL  2 Pt will ambulate extended community distance without an AD without increased pain or difficulty.  Baseline: 02/19/2022 INITIAL  3 Pt will be able to perform his normal daily transfers without difficulty.  Baseline: 02/05/2022 INITIAL  4 Pt will demo good squatting form, symmetrical Wb'ing, and good knee alignment without significant pain for improved functional strength and tolerance with IADLs.  Baseline: 02/19/2022 INITIAL  5 Pt will be able to perform stairs with a reciprocal gait without the rail with good control.  Baseline: 03/05/2022 INITIAL  6 Pt will be able to perform all of his school activities and work activities without significant pain or difficulty.   Baseline: 02/19/2022 INITIAL  7 Pt will demo 5/5 L hip and knee strength for improved tolerance with and performance of functional mobility skills and to assist in returning to  PLOF.   Baseline: 03/05/2022 INITIAL    PLAN: PT FREQUENCY: 2x/week   PT DURATION: other: 10-12 weeks   PLANNED INTERVENTIONS: Therapeutic exercises, Therapeutic activity, Neuro Muscular re-education, Balance training, Gait training, Patient/Family education, Joint mobilization, Stair training, DME instructions, Aquatic Therapy, Electrical stimulation, Cryotherapy, Moist heat, Taping, and Manual therapy   PLAN FOR NEXT SESSION: Cont per Dr. Eddie Dibbles ACL  reconstruction with meniscal repair.  Able to FWB.  Work on flexion ROM.   Allergy to certain adhesives including tegaderm.     Selinda Michaels III PT, DPT 01/05/22 9:16 PM

## 2022-01-10 ENCOUNTER — Other Ambulatory Visit: Payer: Self-pay

## 2022-01-10 ENCOUNTER — Ambulatory Visit (HOSPITAL_BASED_OUTPATIENT_CLINIC_OR_DEPARTMENT_OTHER): Payer: No Typology Code available for payment source | Admitting: Physical Therapy

## 2022-01-10 DIAGNOSIS — M25662 Stiffness of left knee, not elsewhere classified: Secondary | ICD-10-CM

## 2022-01-10 DIAGNOSIS — M25562 Pain in left knee: Secondary | ICD-10-CM | POA: Diagnosis not present

## 2022-01-10 DIAGNOSIS — M6281 Muscle weakness (generalized): Secondary | ICD-10-CM

## 2022-01-10 DIAGNOSIS — R262 Difficulty in walking, not elsewhere classified: Secondary | ICD-10-CM

## 2022-01-10 NOTE — Therapy (Signed)
OUTPATIENT PHYSICAL THERAPY TREATMENT NOTE   Patient Name: Dustin Parks MRN: [id number] DOB:August 17, 2001, 20 y.o., male Today's Date: 01/11/2022  PCP: Libby Maw, MD REFERRING PROVIDER: Dr Donivan Scull    PT End of Session - 01/10/22 1357     Visit Number 11    Number of Visits 32    Date for PT Re-Evaluation 02/05/22    Progress Note Due on Visit --   01/11/2022   PT Start Time 1314    PT Stop Time 1354    PT Time Calculation (min) 40 min    Activity Tolerance Patient tolerated treatment well    Behavior During Therapy WFL for tasks assessed/performed                 Past Medical History:  Diagnosis Date   Asthma    out grown now    Past Surgical History:  Procedure Laterality Date   KNEE ARTHROSCOPY WITH ANTERIOR CRUCIATE LIGAMENT (ACL) REPAIR WITH HAMSTRING GRAFT Left 11/08/2021   Procedure: LEFT KNEE ANTERIOR CRUCIATE LIGAMENT RECONSTRUCTION WITH QUADRICEPS TENDON AUTOGRAFT;  Surgeon: [name], MD;  Location: [place] [facility];  Service: Orthopedics;  Laterality: Left;   KNEE ARTHROSCOPY WITH MENISCAL REPAIR  11/08/2021   Procedure: LEFT LATERAL MENISCAL REPAIR;  Surgeon: [name], MD;  Location: [facility];  Service: Orthopedics;;   [place] EXTRACTION  2022   Patient Active Problem List   Diagnosis Date Noted   Allergic reaction 12/13/2021   Left anterior cruciate ligament tear    Complex tear of lateral meniscus of left knee as current injury    Somatic dysfunction of spine, cervical 10/05/2021   Adjustment disorder 09/26/2021   Encounter for vasectomy counseling 09/26/2021   Neck pain 09/26/2021   History of orchitis 02/08/2021     PCP: Libby Maw, MD   REFERRING PROVIDER: [name], MD   REFERRING DIAG: [phone] (ICD-10-CM) - Rupture of anterior cruciate ligament of left knee, initial encounter       S83.272A (ICD-10-CM) - Complex tear of lateral meniscus of left knee as  current injury, initial encounter      Z98.890 (ICD-10-CM) - S/P left knee arthroscopy    THERAPY DIAG:  Left knee pain, unspecified chronicity   Stiffness of left knee, not elsewhere classified   Muscle weakness (generalized)   Difficulty in walking, not elsewhere classified   ONSET DATE: Injury 10/09/2021 / Surgery 11/08/2021   SUBJECTIVE:    SUBJECTIVE STATEMENT: -Pt is 9 weeks s/p Left knee ACL reconstruction with quadriceps autograft and lateral meniscal repair.  Pt is ambulating without crutches and walked into clinic without crutches.  Pt states he is hurting a little more today due to stumbling over a branch when ambulating outside yesterday.  Pt states he didn't fall though his knee bent quickly.    Pt saw MD and Pt states MD wants pt to perform aquatic therapy and increase frequency to 3x/wk.  Pt states he may manipulate knee in 1 month if his flexion is not improving.    Pt states he is only performing his HEP once per week and is randomly doing stretches.  Pt states he is not performing stretching 3 times per day as PT has suggested.   Pt denies any adverse effects after prior Rx just minimally increased pain.  Pt missed last PT appt due to having a baby.  Pt states he has been working more on his knee flexion ROM being more compliant with  HEP.  He states he has a sharp pain with bending knee and doesn't think it is improving.  Pt states his knee feels weaker but he thinks it is due to doing more.  Pt is not using the crutches much anymore with ambulation.   Pt states he helped his grandfather and climbed a ladder to work on the siding.      PERTINENT HISTORY: 11/08/2021 Left knee ACL reconstruction with quadriceps autograft and lateral meniscal repair.  Allergy to certain adhesives including tegaderm per pt report    PAIN:  PAIN:  Are you having pain? No NPRS scale: 1-2/10 current, 3/10 worst pain Pain location: right lateral knee  Pain orientation: Right  PAIN TYPE:  aching Pain description: intermittent  Aggravating factors: use of the knee / falling off the bed  Relieving factors: rest     PRECAUTIONS: Other: per Dr. Eddie Dibbles ACL reconstruction with meniscal repair   WEIGHT BEARING RESTRICTIONS Yes      OCCUPATION: in culinary school     PLOF: Independent; Pt was able to perform all of his ADLs/IADLs and functional mobility skills without difficulty or limitations.  Pt was able to perform work activities.  Pt ambulated without an AD with good stability without difficulty.    PATIENT GOALS improved stability with leaning, to have the ability to run, to be able to bowl.  Return to PLOF.      OBJECTIVE:       TODAY'S TREATMENT:    L knee flexion PROM:  71 deg   Therapeutic Exercise: Reviewed current function, HEP compliance, response to prior Rx, and pain level. Pt performed: -Recumbent bike with 1/4 revolutions x 5 mins -Tailgate knee flexion self stretch 5-10s 10x -supine heel slide with strap x 10 reps with 5 sec hold -Pt received L knee flex and ext PROM in supine per pt tolerance and tissue tolerance. -Pt received L knee flexion PROM seated at EOT per pt tolerance and tissue tolerance. -Bilat Heel Raises 3x10 reps -Wall sits at 45 deg 3x30 sec -Step ups on 4 inch step 2x10 reps     PATIENT EDUCATION:  Education details:  Educated pt and encouraged pt in being compliant with HEP.   Educated pt on the importance of performing his stretching consistently and frequently.  PT instructed pt he needs to perform his knee ROM exercises 3 times per day.  protocol, acceptable levels of discomfort, HEP, POC.   Person educated: Patient Education method: Explanation, Demonstration, Tactile cues, Verbal cues, and Handouts Education comprehension: verbalized understanding, returned demonstration, verbal cues required, tactile cues required, and needs further education     HOME EXERCISE PROGRAM: Access Code: [email or web address] URL:  [email or web address] Date: 11/13/2021 Prepared by: Ronny Flurry        ASSESSMENT:   CLINICAL IMPRESSION: Pt entered the clinic without crutches with brace.  Pt does not tolerate stretching well due to pain.  He compensates for knee flexion with hiking L hip and/or leaning to R when PT is stretching L knee.  He continues to be very limited with knee flexion ROM and has pain with stretching.  Pt actually was a little worse with knee flexion PROM today compared to prior Rx.  Pt is still not compliant with HEP/stretching.  PT instructed and encouraged pt to be compliant and consistent with stretching and informed pt concerning the importance.  Pt is doing some random stretching and performing HEP once per day.  Progressed closed chain activities with pt today and pt tolerated it  well.  Pt should benefit from cont skilled PT services per protocol to address ongoing goals and to restore PLOF.       Objective impairments include Abnormal gait, decreased activity tolerance, decreased balance, decreased mobility, difficulty walking, decreased ROM, decreased strength, hypomobility, increased edema, impaired flexibility, and pain. These impairments are limiting patient from cleaning, community activity, driving, meal prep, occupation, laundry, shopping, school, and ambulation .     REHAB POTENTIAL: Good   CLINICAL DECISION MAKING: Stable/uncomplicated   EVALUATION COMPLEXITY: Low     GOALS:     SHORT TERM GOALS:   STG Name Target Date Goal status  1 Pt will be independent and compliant with HEP for improved pain, strength, and ROM.  Baseline:  12/11/2021 PARTIALLY MET  2 Pt will demo a good quad set and perform supine SLRs independently with brace for improved quad strength. Baseline:  11/27/2021 PROGRESSING  3 Pt will demo improved improved L knee extension PROM/AROM to 0/3 and flexion PROM to 60 deg for improved mobility and stiffness. Baseline: 12/04/2021 GOAL MET  4 Pt will  demo improved improved L knee extension AROM to 0 deg and  flexion AAROM/PROM to 80/90 deg for improved mobility and stiffness. Baseline: 12/18/2021 PARTIALLY MET  5 Pt will demo L knee AROM to be 0 - 120 deg for improved stiffness and performance of functional mobility.   Baseline: 01/22/2022 INITIAL  6 Pt will progress Wb'ing with gait per MD orders and protocol without adverse effects.  Baseline: 12/18/2021 PROGRESSING  7 Pt will progress with closed chain exercises per protocol without adverse effects for improved functional strength and stability.  Baseline: 12/25/2021 INITIAL  8 Pt will score 0-1 on the lateral step down test on a 4 inch step for improved quad eccentric control.   01/22/2022   INITIAL  9 Pt will score 0-1 on the lateral step down test on a 6 inch step for improved quad eccentric control and performance of stairs.   02/06/2022 INITIAL  10 Pt will ambulate with a normalized heel to toe gait without limping without AD 01/22/2022 INITIAL    LONG TERM GOALS:    LTG Name Target Date Goal status  1 Pt will be able to perform his ADLs/IADLs and normal functional mobility skills without significant difficulty or pain.  Baseline: 03/05/2022   INITIAL  2 Pt will ambulate extended community distance without an AD without increased pain or difficulty.  Baseline: 02/19/2022 INITIAL  3 Pt will be able to perform his normal daily transfers without difficulty.  Baseline: 02/05/2022 INITIAL  4 Pt will demo good squatting form, symmetrical Wb'ing, and good knee alignment without significant pain for improved functional strength and tolerance with IADLs.  Baseline: 02/19/2022 INITIAL  5 Pt will be able to perform stairs with a reciprocal gait without the rail with good control.  Baseline: 03/05/2022 INITIAL  6 Pt will be able to perform all of his school activities and work activities without significant pain or difficulty.   Baseline: 02/19/2022 INITIAL  7 Pt will demo 5/5 L hip and knee strength for  improved tolerance with and performance of functional mobility skills and to assist in returning to PLOF.   Baseline: 03/05/2022 INITIAL    PLAN: PT FREQUENCY: 2x/week   PT DURATION: other: 10-12 weeks   PLANNED INTERVENTIONS: Therapeutic exercises, Therapeutic activity, Neuro Muscular re-education, Balance training, Gait training, Patient/Family education, Joint mobilization, Stair training, DME instructions, Aquatic Therapy, Electrical stimulation, Cryotherapy, Moist heat, Taping, and Manual therapy  PLAN FOR NEXT SESSION: Cont per Dr. Eddie Dibbles ACL reconstruction with meniscal repair.  Able to FWB.  Work on flexion ROM.  PN next visit.   Allergy to certain adhesives including tegaderm.     Selinda Michaels III PT, DPT 01/11/22 9:06 AM

## 2022-01-11 ENCOUNTER — Encounter (HOSPITAL_BASED_OUTPATIENT_CLINIC_OR_DEPARTMENT_OTHER): Payer: Self-pay | Admitting: Physical Therapy

## 2022-01-12 ENCOUNTER — Encounter (HOSPITAL_BASED_OUTPATIENT_CLINIC_OR_DEPARTMENT_OTHER): Payer: Self-pay | Admitting: Physical Therapy

## 2022-01-12 ENCOUNTER — Other Ambulatory Visit: Payer: Self-pay

## 2022-01-12 ENCOUNTER — Ambulatory Visit (HOSPITAL_BASED_OUTPATIENT_CLINIC_OR_DEPARTMENT_OTHER): Payer: No Typology Code available for payment source | Admitting: Physical Therapy

## 2022-01-12 DIAGNOSIS — M25562 Pain in left knee: Secondary | ICD-10-CM

## 2022-01-12 DIAGNOSIS — M6281 Muscle weakness (generalized): Secondary | ICD-10-CM

## 2022-01-12 DIAGNOSIS — R262 Difficulty in walking, not elsewhere classified: Secondary | ICD-10-CM

## 2022-01-12 DIAGNOSIS — M25662 Stiffness of left knee, not elsewhere classified: Secondary | ICD-10-CM

## 2022-01-12 NOTE — Therapy (Signed)
OUTPATIENT PHYSICAL THERAPY TREATMENT NOTE   Patient Name: Dustin Parks MRN: [id number] DOB:2001/09/17, 21 y.o., male Today's Date: 01/12/2022  PCP: Libby Maw, MD REFERRING PROVIDER: Dr Donivan Scull    PT End of Session - 01/12/22 1326     Visit Number 12    Number of Visits 32    Date for PT Re-Evaluation 02/05/22    PT Start Time 1320    PT Stop Time 1408    PT Time Calculation (min) 48 min    Activity Tolerance Patient tolerated treatment well    Behavior During Therapy WFL for tasks assessed/performed                 Past Medical History:  Diagnosis Date   Asthma    out grown now    Past Surgical History:  Procedure Laterality Date   KNEE ARTHROSCOPY WITH ANTERIOR CRUCIATE LIGAMENT (ACL) REPAIR WITH HAMSTRING GRAFT Left 11/08/2021   Procedure: LEFT KNEE ANTERIOR CRUCIATE LIGAMENT RECONSTRUCTION WITH QUADRICEPS TENDON AUTOGRAFT;  Surgeon: [name], MD;  Location: [facility];  Service: Orthopedics;  Laterality: Left;   KNEE ARTHROSCOPY WITH MENISCAL REPAIR  11/08/2021   Procedure: LEFT LATERAL MENISCAL REPAIR;  Surgeon: [name], MD;  Location: [facility];  Service: Orthopedics;;   [place] EXTRACTION  2022   Patient Active Problem List   Diagnosis Date Noted   Allergic reaction 12/13/2021   Left anterior cruciate ligament tear    Complex tear of lateral meniscus of left knee as current injury    Somatic dysfunction of spine, cervical 10/05/2021   Adjustment disorder 09/26/2021   Encounter for vasectomy counseling 09/26/2021   Neck pain 09/26/2021   History of orchitis 02/08/2021     PCP: Libby Maw, MD   REFERRING PROVIDER: [name], MD   REFERRING DIAG: [phone] (ICD-10-CM) - Rupture of anterior cruciate ligament of left knee, initial encounter       S83.272A (ICD-10-CM) - Complex tear of lateral meniscus of left knee as current injury, initial encounter      Z98.890  (ICD-10-CM) - S/P left knee arthroscopy    THERAPY DIAG:  Left knee pain, unspecified chronicity   Stiffness of left knee, not elsewhere classified   Muscle weakness (generalized)   Difficulty in walking, not elsewhere classified   ONSET DATE: Injury 10/09/2021 / Surgery 11/08/2021   SUBJECTIVE:    SUBJECTIVE STATEMENT: -Pt is 9 weeks and 2 days s/p Left knee ACL reconstruction with quadriceps autograft and lateral meniscal repair.       Pt saw MD and Pt states MD wants pt to perform aquatic therapy and increase frequency to 3x/wk.  Pt states he may manipulate knee in 1 month if his flexion is not improving.    Pt denies any adverse effects after prior Rx.  Pt states he performed his knee flexion exercises 3 times yesterday.  Pt reports improved ambulation and is trying to focus on heel to toe gait pattern.  Pt ambulates in home some without brace.  Pt is limited with bending knee and has much difficulty with bending knee.  He states he is limited with mobility with everything that is related to bending knee.  Pt is not using the crutches much anymore with ambulation.   Pt took 1 tylenol arthritis before Rx.  PERTINENT HISTORY: 11/08/2021 Left knee ACL reconstruction with quadriceps autograft and lateral meniscal repair.  Allergy to certain adhesives including tegaderm per pt report  PAIN:  PAIN:  Are you having pain? No NPRS scale: 0/10 current, 3/10 worst pain Pain location: right lateral knee  Pain orientation: Right  PAIN TYPE: aching Pain description: intermittent  Aggravating factors: use of the knee / falling off the bed  Relieving factors: rest     PRECAUTIONS: Other: per Dr. Eddie Dibbles ACL reconstruction with meniscal repair   WEIGHT BEARING RESTRICTIONS Yes      OCCUPATION: in culinary school     PLOF: Independent; Pt was able to perform all of his ADLs/IADLs and functional mobility skills without difficulty or limitations.  Pt was able to perform work activities.   Pt ambulated without an AD with good stability without difficulty.    PATIENT GOALS improved stability with leaning, to have the ability to run, to be able to bowl.  Return to PLOF.      OBJECTIVE:       TODAY'S TREATMENT:    L knee flexion PROM:  81 deg L knee flexion AAROM:  82 deg   Therapeutic Exercise: Reviewed current function, HEP compliance, response to prior Rx, and pain level. Pt performed: -Recumbent bike with 1/4 revolutions x 6 mins -Tailgate knee flexion self stretch 5-10s 10x -Nu-step with just L LE on pedal and using R UE to stretch with 20-30 sec hold -Pt received L knee flex and ext PROM in supine per pt tolerance and tissue tolerance. -Pt received L knee flexion PROM seated at EOT per pt tolerance and tissue tolerance. -TKE with GTB 2x10 reps -Wall sits at 45 deg 3x30 sec -Step ups on 6 inch step 2x10 reps -Mini squats with UE assist 2x10 reps     PATIENT EDUCATION:  Education details:  Educated pt and encouraged pt in being compliant with HEP.   Educated pt on the importance of performing his stretching consistently and frequently.  PT instructed pt he needs to perform his knee ROM exercises 3 times per day.  protocol, acceptable levels of discomfort, HEP, POC.   Person educated: Patient Education method: Explanation, Demonstration, Tactile cues, Verbal cues, and Handouts Education comprehension: verbalized understanding, returned demonstration, verbal cues required, tactile cues required, and needs further education     HOME EXERCISE PROGRAM: Access Code: [email or web address] URL: [email or web address] Date: 11/13/2021 Prepared by: Ronny Flurry        ASSESSMENT:   CLINICAL IMPRESSION: Pt presents to Rx reporting that he performed his stretching 3x yesterday.  He continues to be limited and tight with knee flexion ROM though did show improvement today.  Pt demonstrates a 10 deg improvement in flexion ROM compared to prior Rx.  Pt has pain with  knee flexion PROM and stretching.  He had some improvement in tolerance with stretching though cont to be limited with tolerance.  PT provided manual assistance to decrease compensatory hip hike with knee flexion PROM seated at EOT.  PT progressed closed chain activities per protocol and pt performed exercises well with cuing for correct form.  Pt was fatigued with increased closed chain activities.  Pt responded well to Rx having no pain after Rx.  Pt should benefit from cont skilled PT services per protocol to address ongoing goals and to restore PLOF.      Objective impairments include Abnormal gait, decreased activity tolerance, decreased balance, decreased mobility, difficulty walking, decreased ROM, decreased strength, hypomobility, increased edema, impaired flexibility, and pain. These impairments are limiting patient from cleaning, community activity, driving, meal prep, occupation, laundry, shopping, school, and ambulation .     REHAB  POTENTIAL: Good   CLINICAL DECISION MAKING: Stable/uncomplicated   EVALUATION COMPLEXITY: Low     GOALS:     SHORT TERM GOALS:   STG Name Target Date Goal status  1 Pt will be independent and compliant with HEP for improved pain, strength, and ROM.  Baseline:  12/11/2021 PARTIALLY MET  2 Pt will demo a good quad set and perform supine SLRs independently with brace for improved quad strength. Baseline:  11/27/2021 PROGRESSING  3 Pt will demo improved improved L knee extension PROM/AROM to 0/3 and flexion PROM to 60 deg for improved mobility and stiffness. Baseline: 12/04/2021 GOAL MET  4 Pt will demo improved improved L knee extension AROM to 0 deg and  flexion AAROM/PROM to 80/90 deg for improved mobility and stiffness. Baseline: 12/18/2021 PARTIALLY MET  5 Pt will demo L knee AROM to be 0 - 120 deg for improved stiffness and performance of functional mobility.   Baseline: 01/22/2022 INITIAL  6 Pt will progress Wb'ing with gait per MD orders and protocol  without adverse effects.  Baseline: 12/18/2021 PROGRESSING  7 Pt will progress with closed chain exercises per protocol without adverse effects for improved functional strength and stability.  Baseline: 12/25/2021 INITIAL  8 Pt will score 0-1 on the lateral step down test on a 4 inch step for improved quad eccentric control.   01/22/2022   INITIAL  9 Pt will score 0-1 on the lateral step down test on a 6 inch step for improved quad eccentric control and performance of stairs.   02/06/2022 INITIAL  10 Pt will ambulate with a normalized heel to toe gait without limping without AD 01/22/2022 INITIAL    LONG TERM GOALS:    LTG Name Target Date Goal status  1 Pt will be able to perform his ADLs/IADLs and normal functional mobility skills without significant difficulty or pain.  Baseline: 03/05/2022   INITIAL  2 Pt will ambulate extended community distance without an AD without increased pain or difficulty.  Baseline: 02/19/2022 INITIAL  3 Pt will be able to perform his normal daily transfers without difficulty.  Baseline: 02/05/2022 INITIAL  4 Pt will demo good squatting form, symmetrical Wb'ing, and good knee alignment without significant pain for improved functional strength and tolerance with IADLs.  Baseline: 02/19/2022 INITIAL  5 Pt will be able to perform stairs with a reciprocal gait without the rail with good control.  Baseline: 03/05/2022 INITIAL  6 Pt will be able to perform all of his school activities and work activities without significant pain or difficulty.   Baseline: 02/19/2022 INITIAL  7 Pt will demo 5/5 L hip and knee strength for improved tolerance with and performance of functional mobility skills and to assist in returning to PLOF.   Baseline: 03/05/2022 INITIAL    PLAN: PT FREQUENCY: 2x/week   PT DURATION: other: 10-12 weeks   PLANNED INTERVENTIONS: Therapeutic exercises, Therapeutic activity, Neuro Muscular re-education, Balance training, Gait training, Patient/Family education,  Joint mobilization, Stair training, DME instructions, Aquatic Therapy, Electrical stimulation, Cryotherapy, Moist heat, Taping, and Manual therapy   PLAN FOR NEXT SESSION: Cont with progressing exercises per Dr. Eddie Dibbles ACL reconstruction with meniscal repair protocol.  Work on flexion ROM including Nustep stretch.  PN next visit.   Allergy to certain adhesives including tegaderm.     Selinda Michaels III PT, DPT 01/12/22 9:24 PM

## 2022-01-15 ENCOUNTER — Other Ambulatory Visit: Payer: Self-pay

## 2022-01-15 ENCOUNTER — Encounter: Payer: Self-pay | Admitting: Physical Therapy

## 2022-01-15 ENCOUNTER — Ambulatory Visit: Payer: No Typology Code available for payment source | Attending: Family Medicine | Admitting: Physical Therapy

## 2022-01-15 DIAGNOSIS — M25562 Pain in left knee: Secondary | ICD-10-CM

## 2022-01-15 DIAGNOSIS — R262 Difficulty in walking, not elsewhere classified: Secondary | ICD-10-CM | POA: Diagnosis present

## 2022-01-15 DIAGNOSIS — M25662 Stiffness of left knee, not elsewhere classified: Secondary | ICD-10-CM | POA: Diagnosis present

## 2022-01-15 DIAGNOSIS — M6281 Muscle weakness (generalized): Secondary | ICD-10-CM

## 2022-01-15 NOTE — Therapy (Signed)
OUTPATIENT PHYSICAL THERAPY TREATMENT NOTE   Patient Name: Dustin Parks MRN: [id number] DOB:06/22/2001, 21 y.o., male Today's Date: 01/15/2022  PCP: Libby Maw, MD REFERRING PROVIDER: Dr Donivan Scull    PT End of Session - 01/15/22 0804     Visit Number 13    Number of Visits 32    Date for PT Re-Evaluation 02/05/22    PT Start Time 0800    PT Stop Time 0848    PT Time Calculation (min) 48 min    Equipment Utilized During Treatment Other (comment)    Activity Tolerance Patient tolerated treatment well    Behavior During Therapy WFL for tasks assessed/performed                  Past Medical History:  Diagnosis Date   Asthma    out grown now    Past Surgical History:  Procedure Laterality Date   KNEE ARTHROSCOPY WITH ANTERIOR CRUCIATE LIGAMENT (ACL) REPAIR WITH HAMSTRING GRAFT Left 11/08/2021   Procedure: LEFT KNEE ANTERIOR CRUCIATE LIGAMENT RECONSTRUCTION WITH QUADRICEPS TENDON AUTOGRAFT;  Surgeon: [name], MD;  Location: [facility];  Service: Orthopedics;  Laterality: Left;   KNEE ARTHROSCOPY WITH MENISCAL REPAIR  11/08/2021   Procedure: LEFT LATERAL MENISCAL REPAIR;  Surgeon: [name], MD;  Location: [facility];  Service: Orthopedics;;   [place] EXTRACTION  2022   Patient Active Problem List   Diagnosis Date Noted   Allergic reaction 12/13/2021   Left anterior cruciate ligament tear    Complex tear of lateral meniscus of left knee as current injury    Somatic dysfunction of spine, cervical 10/05/2021   Adjustment disorder 09/26/2021   Encounter for vasectomy counseling 09/26/2021   Neck pain 09/26/2021   History of orchitis 02/08/2021     PCP: Libby Maw, MD   REFERRING PROVIDER: [name], MD   REFERRING DIAG: [phone] (ICD-10-CM) - Rupture of anterior cruciate ligament of left knee, initial encounter       S83.272A (ICD-10-CM) - Complex tear of lateral meniscus of left  knee as current injury, initial encounter      Z98.890 (ICD-10-CM) - S/P left knee arthroscopy    THERAPY DIAG:  Left knee pain, unspecified chronicity   Stiffness of left knee, not elsewhere classified   Muscle weakness (generalized)   Difficulty in walking, not elsewhere classified   ONSET DATE: Injury 10/09/2021 / Surgery 11/08/2021   SUBJECTIVE:    SUBJECTIVE STATEMENT: " I have no pain today but my knee is pretty stiff."   PERTINENT HISTORY: 11/08/2021 Left knee ACL reconstruction with quadriceps autograft and lateral meniscal repair.  Allergy to certain adhesives including tegaderm per pt report    PAIN:  PAIN:  Are you having pain? No NPRS scale: 0/10 current, 3/10 worst pain Pain location: right lateral knee  Pain orientation: Right  PAIN TYPE: aching Pain description: intermittent  Aggravating factors: use of the knee / falling off the bed  Relieving factors: rest     PRECAUTIONS: Other: per Dr. Eddie Dibbles ACL reconstruction with meniscal repair   WEIGHT BEARING RESTRICTIONS Yes      OCCUPATION: in culinary school     PLOF: Independent; Pt was able to perform all of his ADLs/IADLs and functional mobility skills without difficulty or limitations.  Pt was able to perform work activities.  Pt ambulated without an AD with good stability without difficulty.    PATIENT GOALS improved stability with leaning, to have the ability to  run, to be able to bowl.  Return to PLOF.      OBJECTIVE:     TODAY'S TREATMENT:   [facility] Adult PT Treatment:                                                DATE: 01/15/2022 L knee flexion PROM: 90  deg (noted a pop in the knee) L knee flexion AAROM: 80 deg  Therapeutic Exercise: Thomas test position quad stretch: PNF contract/ relax 3 x 30 sec with 10 second quad isometric contraction Recumbent bike 1/2 revolutions x 6 min  - cues provided for allowing for ankle PF to allow for increased knee flexion Knee flexion seated with RTB 2 x  15 Manual Therapy: MTPR along the distal rectus femoris and vastus lateralis MTPR using tennis ball along the muscle belly of the semi-tendinosis  AP grade III & IV L tibiofemoral joint with pt flexing knee to max end range between 4 sets of 30 Tibiofemoral distraction oscillations combined with passive flexion gradually working into flexion Neuromuscular re-ed: Reciprocal inhibition with R LAQ combined with L knee flexion 2 x 20 Self- Care Adjusting brace providing information on location of where the condyle pads are supposed to rest. How to tighten / adjust straps as needed to maximize fit and reduce sliding.        L knee flexion PROM:  81 deg L knee flexion AAROM:  82 deg   Therapeutic Exercise: Reviewed current function, HEP compliance, response to prior Rx, and pain level. Pt performed: -Recumbent bike with 1/4 revolutions x 6 mins -Tailgate knee flexion self stretch 5-10s 10x -Nu-step with just L LE on pedal and using R UE to stretch with 20-30 sec hold -Pt received L knee flex and ext PROM in supine per pt tolerance and tissue tolerance. -Pt received L knee flexion PROM seated at EOT per pt tolerance and tissue tolerance. -TKE with GTB 2x10 reps -Wall sits at 45 deg 3x30 sec -Step ups on 6 inch step 2x10 reps -Mini squats with UE assist 2x10 reps     PATIENT EDUCATION:  Education details:  Educated pt and encouraged pt in being compliant with HEP.   Educated pt on the importance of performing his stretching consistently and frequently.  PT instructed pt he needs to perform his knee ROM exercises 3 times per day.  protocol, acceptable levels of discomfort, HEP, POC.   Person educated: Patient Education method: Explanation, Demonstration, Tactile cues, Verbal cues, and Handouts Education comprehension: verbalized understanding, returned demonstration, verbal cues required, tactile cues required, and needs further education     HOME EXERCISE PROGRAM: Access Code:  [email or web address] URL: [email or web address] Date: 11/13/2021 Prepared by: Ronny Flurry        ASSESSMENT:   CLINICAL IMPRESSION: Pt arrives to session with no pain today but does report taking medication to allow for his muscles to relax to assist with ROM. Focused session primarily on ROM with heavy emphasis on flexion. He does exhibit guarding and apprehension related to flexion but was able to tolerate manual techniques performed today. Reviewed low load long duration wall slides to maximize flexion, and importance of doing his HEP consistently. AAROM he was able to get 80 degrees and PROM made it to 90 degrees while sitting EOB noting some popping of adhesions. Helped adjust his brace to allow for fit, and reviewed  how to do it independently. End of session he denied modalities.      Objective impairments include Abnormal gait, decreased activity tolerance, decreased balance, decreased mobility, difficulty walking, decreased ROM, decreased strength, hypomobility, increased edema, impaired flexibility, and pain. These impairments are limiting patient from cleaning, community activity, driving, meal prep, occupation, laundry, shopping, school, and ambulation .     REHAB POTENTIAL: Good   CLINICAL DECISION MAKING: Stable/uncomplicated   EVALUATION COMPLEXITY: Low     GOALS:     SHORT TERM GOALS:   STG Name Target Date Goal status  1 Pt will be independent and compliant with HEP for improved pain, strength, and ROM.  Baseline:  12/11/2021 PARTIALLY MET  2 Pt will demo a good quad set and perform supine SLRs independently with brace for improved quad strength. Baseline:  11/27/2021 PROGRESSING  3 Pt will demo improved improved L knee extension PROM/AROM to 0/3 and flexion PROM to 60 deg for improved mobility and stiffness. Baseline: 12/04/2021 GOAL MET  4 Pt will demo improved improved L knee extension AROM to 0 deg and  flexion AAROM/PROM to 80/90 deg for improved mobility and  stiffness. Baseline: 12/18/2021 PARTIALLY MET  5 Pt will demo L knee AROM to be 0 - 120 deg for improved stiffness and performance of functional mobility.   Baseline: 01/22/2022 INITIAL  6 Pt will progress Wb'ing with gait per MD orders and protocol without adverse effects.  Baseline: 12/18/2021 PROGRESSING  7 Pt will progress with closed chain exercises per protocol without adverse effects for improved functional strength and stability.  Baseline: 12/25/2021 INITIAL  8 Pt will score 0-1 on the lateral step down test on a 4 inch step for improved quad eccentric control.   01/22/2022   INITIAL  9 Pt will score 0-1 on the lateral step down test on a 6 inch step for improved quad eccentric control and performance of stairs.   02/06/2022 INITIAL  10 Pt will ambulate with a normalized heel to toe gait without limping without AD 01/22/2022 INITIAL    LONG TERM GOALS:    LTG Name Target Date Goal status  1 Pt will be able to perform his ADLs/IADLs and normal functional mobility skills without significant difficulty or pain.  Baseline: 03/05/2022   INITIAL  2 Pt will ambulate extended community distance without an AD without increased pain or difficulty.  Baseline: 02/19/2022 INITIAL  3 Pt will be able to perform his normal daily transfers without difficulty.  Baseline: 02/05/2022 INITIAL  4 Pt will demo good squatting form, symmetrical Wb'ing, and good knee alignment without significant pain for improved functional strength and tolerance with IADLs.  Baseline: 02/19/2022 INITIAL  5 Pt will be able to perform stairs with a reciprocal gait without the rail with good control.  Baseline: 03/05/2022 INITIAL  6 Pt will be able to perform all of his school activities and work activities without significant pain or difficulty.   Baseline: 02/19/2022 INITIAL  7 Pt will demo 5/5 L hip and knee strength for improved tolerance with and performance of functional mobility skills and to assist in returning to PLOF.    Baseline: 03/05/2022 INITIAL    PLAN: PT FREQUENCY: 2x/week   PT DURATION: other: 10-12 weeks   PLANNED INTERVENTIONS: Therapeutic exercises, Therapeutic activity, Neuro Muscular re-education, Balance training, Gait training, Patient/Family education, Joint mobilization, Stair training, DME instructions, Aquatic Therapy, Electrical stimulation, Cryotherapy, Moist heat, Taping, and Manual therapy   PLAN FOR NEXT SESSION: Cont with progressing exercises per  Dr. Eddie Dibbles ACL reconstruction with meniscal repair protocol.  Work on flexion ROM including Nustep stretch.  PN next visit.   Allergy to certain adhesives including tegaderm.    Haitham Dolinsky PT, DPT, LAT, ATC  01/15/22  11:26 AM

## 2022-01-17 ENCOUNTER — Encounter (HOSPITAL_BASED_OUTPATIENT_CLINIC_OR_DEPARTMENT_OTHER): Payer: Self-pay | Admitting: Physical Therapy

## 2022-01-17 ENCOUNTER — Ambulatory Visit (HOSPITAL_BASED_OUTPATIENT_CLINIC_OR_DEPARTMENT_OTHER): Payer: No Typology Code available for payment source | Admitting: Physical Therapy

## 2022-01-17 ENCOUNTER — Telehealth: Payer: Self-pay | Admitting: Orthopaedic Surgery

## 2022-01-17 ENCOUNTER — Other Ambulatory Visit: Payer: Self-pay

## 2022-01-17 DIAGNOSIS — M25562 Pain in left knee: Secondary | ICD-10-CM | POA: Diagnosis not present

## 2022-01-17 NOTE — Therapy (Signed)
?OUTPATIENT PHYSICAL THERAPY TREATMENT NOTE  ? ?Patient Name: Dustin Parks ?MRN: [id number] ?DOB:2001/03/20, 21 y.o., male ?Today's Date: 01/18/2022 ? ?PCP: Libby Maw, MD ?REFERRING PROVIDER: Dr Donivan Scull  ? ? PT End of Session - 01/17/22 1339   ? ? Visit Number 14   ? Number of Visits 32   ? Date for PT Re-Evaluation 02/05/22   ? PT Start Time 1308   ? PT Stop Time 1350   ? PT Time Calculation (min) 42 min   ? Activity Tolerance Patient tolerated treatment well   ? Behavior During Therapy [facility] for tasks assessed/performed   ? ?  ?  ? ?  ? ? ? ? ? ? ? ?Past Medical History:  ?Diagnosis Date  ? Asthma   ? out grown now   ? ?Past Surgical History:  ?Procedure Laterality Date  ? KNEE ARTHROSCOPY WITH ANTERIOR CRUCIATE LIGAMENT (ACL) REPAIR WITH HAMSTRING GRAFT Left 11/08/2021  ? Procedure: LEFT KNEE ANTERIOR CRUCIATE LIGAMENT RECONSTRUCTION WITH QUADRICEPS TENDON AUTOGRAFT;  Surgeon: [name], MD;  Location: [place];  Service: Orthopedics;  Laterality: Left;  ? KNEE ARTHROSCOPY WITH MENISCAL REPAIR  11/08/2021  ? Procedure: LEFT LATERAL MENISCAL REPAIR;  Surgeon: [name], MD;  Location: [place];  Service: Orthopedics;;  ? [place] EXTRACTION  2022  ? ?Patient Active Problem List  ? Diagnosis Date Noted  ? Allergic reaction 12/13/2021  ? Left anterior cruciate ligament tear   ? Complex tear of lateral meniscus of left knee as current injury   ? Somatic dysfunction of spine, cervical 10/05/2021  ? Adjustment disorder 09/26/2021  ? Encounter for vasectomy counseling 09/26/2021  ? Neck pain 09/26/2021  ? History of orchitis 02/08/2021  ? ?  ?PCP: Libby Maw, MD ?  ?REFERRING PROVIDER: [name], MD ?  ?REFERRING DIAG: F57.322G (ICD-10-CM) - Rupture of anterior cruciate ligament of left knee, initial encounter  ?     U54.270W (ICD-10-CM) - Complex tear of lateral meniscus of left knee as current injury, initial encounter      Z98.890  (ICD-10-CM) - S/P left knee arthroscopy  ?  ?THERAPY DIAG:  ?Left knee pain, unspecified chronicity ?  ?Stiffness of left knee, not elsewhere classified ?  ?Muscle weakness (generalized) ?  ?Difficulty in walking, not elsewhere classified ?  ?ONSET DATE: Injury 10/09/2021 / Surgery 11/08/2021 ?  ?SUBJECTIVE:  ?  ?SUBJECTIVE STATEMENT: ?-Pt is 9 weeks and 2 days s/p Left knee ACL reconstruction with quadriceps autograft and lateral meniscal repair.      ? ?Pt states he was able to get 90 deg in PT last visit.  Pt denies any adverse effects after prior Rx.   ? ?Pt saw MD and Pt states MD wants pt to perform aquatic therapy and increase frequency to 3x/wk.  Pt states he may manipulate knee in 1 month if his flexion is not improving.   ? ?Pt denies any adverse effects after prior Rx.  Pt states he performed his knee flexion exercises 3 times yesterday.  Pt reports improved ambulation and is trying to focus on heel to toe gait pattern.  Pt ambulates in home some without brace.  Pt is limited with bending knee and has much difficulty with bending knee.  He states he is limited with mobility with everything that is related to bending knee.  Pt is not using the crutches much anymore with ambulation.   ?Pt did not take any tylenol before Rx.  ? ?  PERTINENT HISTORY: ?11/08/2021 Left knee ACL reconstruction with quadriceps autograft and lateral meniscal repair.  Allergy to certain adhesives including tegaderm per pt report  ?  ?PAIN:  ?PAIN:  ?Are you having pain? No ?NPRS scale: 0/10 current, 3/10 worst pain ?Pain location: right lateral knee  ?Pain orientation: Right  ?PAIN TYPE: aching ?Pain description: intermittent  ?Aggravating factors: use of the knee / falling off the bed  ?Relieving factors: rest   ?  ?PRECAUTIONS: Other: per Dr. Eddie Dibbles ACL reconstruction with meniscal repair ?  ?WEIGHT BEARING RESTRICTIONS Yes  ?  ?  ?OCCUPATION: in culinary school   ?  ?PLOF: Independent; Pt was able to perform all of his ADLs/IADLs  and functional mobility skills without difficulty or limitations.  Pt was able to perform work activities.  Pt ambulated without an AD with good stability without difficulty.  ?  ?PATIENT GOALS improved stability with leaning, to have the ability to run, to be able to bowl.  Return to PLOF.  ?  ?  ?OBJECTIVE:  ? ?  ?  ?TODAY'S TREATMENT: ?  ? L knee flexion PROM:  85 deg ?L knee flexion AAROM:  86 deg ? ? ?Therapeutic Exercise: ?Reviewed current function, HEP compliance, response to prior Rx, and pain level. ?Pt performed: ?-Recumbent bike with 1/4 - 1/2 revolutions x 6 mins ?-Tailgate knee flexion self stretch 5-10s 8x ?-Nu-step with just L LE on pedal and using R UE to stretch with 20-30 sec hold ?-Pt received L knee flex PROM in supine per pt tolerance and tissue tolerance. ?-Pt received L knee flexion PROM seated at EOT per pt tolerance and tissue tolerance. ?-Step ups on 6 inch step 2x10 reps ?-Mini squats with UE assist 2x10 reps ? ?Manual Therapy:  Pt received STM to L quad  in sitting with leg hanging off of table.  Pt received 4D patellar mobs.  ? ? ?  ?PATIENT EDUCATION:  ?Education details:  Educated pt and encouraged pt in being compliant with HEP.   Educated pt on the importance of performing his stretching consistently and frequently.  PT instructed pt he needs to perform his knee ROM exercises 3 times per day.  protocol, acceptable levels of discomfort, HEP, POC.   ?Person educated: Patient ?Education method: Explanation, Demonstration, Tactile cues, Verbal cues, and Handouts ?Education comprehension: verbalized understanding, returned demonstration, verbal cues required, tactile cues required, and needs further education ?  ?  ?HOME EXERCISE PROGRAM: ?Access Code: [email or web address] ?URL: [email or web address] ?Date: 11/13/2021 ?Prepared by: Ronny Flurry ?  ? ?  ?  ?ASSESSMENT: ?  ?CLINICAL IMPRESSION: ?Pt reports improved compliance with HEP.  He is making some progress with knee flexion ROM.   He continues to be limited and tight with knee flexion ROM though is improving.  He continues to have limited tolerance with stretching due to pain though is slowly improving.  PT provided manual assistance to decrease compensatory hip hike with knee flexion PROM seated at EOT.  Pt performed closed chain activities per protocol well without c/o's.  Pt responded well to Rx having no pain after Rx.  Pt should benefit from cont skilled PT services per protocol to address ongoing goals and to restore PLOF.  ?   ? ?Objective impairments include Abnormal gait, decreased activity tolerance, decreased balance, decreased mobility, difficulty walking, decreased ROM, decreased strength, hypomobility, increased edema, impaired flexibility, and pain. These impairments are limiting patient from cleaning, community activity, driving, meal prep, occupation, laundry, shopping, school, and ambulation . ?  ?  ?  REHAB POTENTIAL: Good ?  ?CLINICAL DECISION MAKING: Stable/uncomplicated ?  ?EVALUATION COMPLEXITY: Low ?  ?  ?GOALS: ?  ?  ?SHORT TERM GOALS: ?  ?STG Name Target Date Goal status  ?1 Pt will be independent and compliant with HEP for improved pain, strength, and ROM.  ?Baseline:  12/11/2021 PARTIALLY MET  ?2 Pt will demo a good quad set and perform supine SLRs independently with brace for improved quad strength. ?Baseline:  11/27/2021 PROGRESSING  ?3 Pt will demo improved improved L knee extension PROM/AROM to 0/3 and flexion PROM to 60 deg for improved mobility and stiffness. ?Baseline: 12/04/2021 GOAL MET  ?4 Pt will demo improved improved L knee extension AROM to 0 deg and  flexion AAROM/PROM to 80/90 deg for improved mobility and stiffness. ?Baseline: 12/18/2021 PARTIALLY MET  ?5 Pt will demo L knee AROM to be 0 - 120 deg for improved stiffness and performance of functional mobility.   ?Baseline: 01/22/2022 INITIAL  ?6 Pt will progress Wb'ing with gait per MD orders and protocol without adverse effects.  ?Baseline: 12/18/2021  PROGRESSING  ?7 Pt will progress with closed chain exercises per protocol without adverse effects for improved functional strength and stability.  ?Baseline: 12/25/2021 INITIAL  ?8 Pt will score 0-1 on the la

## 2022-01-17 NOTE — Telephone Encounter (Signed)
Pt submitted medical release form, Long term disability forms, and $25.00 cash payment to [employer]. Accepted 01/17/21 ?

## 2022-01-19 ENCOUNTER — Encounter (HOSPITAL_BASED_OUTPATIENT_CLINIC_OR_DEPARTMENT_OTHER): Payer: Self-pay | Admitting: Physical Therapy

## 2022-01-19 ENCOUNTER — Ambulatory Visit (HOSPITAL_BASED_OUTPATIENT_CLINIC_OR_DEPARTMENT_OTHER): Payer: Self-pay | Admitting: Orthopaedic Surgery

## 2022-01-19 ENCOUNTER — Ambulatory Visit (INDEPENDENT_AMBULATORY_CARE_PROVIDER_SITE_OTHER): Payer: No Typology Code available for payment source | Admitting: Orthopaedic Surgery

## 2022-01-19 ENCOUNTER — Other Ambulatory Visit (HOSPITAL_BASED_OUTPATIENT_CLINIC_OR_DEPARTMENT_OTHER): Payer: Self-pay

## 2022-01-19 ENCOUNTER — Ambulatory Visit (HOSPITAL_BASED_OUTPATIENT_CLINIC_OR_DEPARTMENT_OTHER): Payer: No Typology Code available for payment source | Admitting: Physical Therapy

## 2022-01-19 ENCOUNTER — Encounter (HOSPITAL_BASED_OUTPATIENT_CLINIC_OR_DEPARTMENT_OTHER): Payer: Self-pay | Admitting: Orthopaedic Surgery

## 2022-01-19 ENCOUNTER — Other Ambulatory Visit: Payer: Self-pay

## 2022-01-19 DIAGNOSIS — M25662 Stiffness of left knee, not elsewhere classified: Secondary | ICD-10-CM

## 2022-01-19 DIAGNOSIS — M25562 Pain in left knee: Secondary | ICD-10-CM

## 2022-01-19 DIAGNOSIS — R262 Difficulty in walking, not elsewhere classified: Secondary | ICD-10-CM

## 2022-01-19 DIAGNOSIS — M6281 Muscle weakness (generalized): Secondary | ICD-10-CM

## 2022-01-19 DIAGNOSIS — S83512D Sprain of anterior cruciate ligament of left knee, subsequent encounter: Secondary | ICD-10-CM

## 2022-01-19 DIAGNOSIS — M24662 Ankylosis, left knee: Secondary | ICD-10-CM

## 2022-01-19 MED ORDER — ASPIRIN EC 325 MG PO TBEC
325.0000 mg | DELAYED_RELEASE_TABLET | Freq: Every day | ORAL | 0 refills | Status: DC
  Filled 2022-01-19: qty 30, 30d supply, fill #0

## 2022-01-19 MED ORDER — IBUPROFEN 800 MG PO TABS
800.0000 mg | ORAL_TABLET | Freq: Three times a day (TID) | ORAL | 0 refills | Status: AC
  Filled 2022-01-19: qty 30, 10d supply, fill #0

## 2022-01-19 MED ORDER — ACETAMINOPHEN 500 MG PO TABS
500.0000 mg | ORAL_TABLET | Freq: Three times a day (TID) | ORAL | 0 refills | Status: AC
  Filled 2022-01-19: qty 30, 10d supply, fill #0

## 2022-01-19 MED ORDER — OXYCODONE HCL 5 MG PO TABS
5.0000 mg | ORAL_TABLET | ORAL | 0 refills | Status: DC | PRN
Start: 2022-01-19 — End: 2022-04-25
  Filled 2022-01-19: qty 20, 4d supply, fill #0

## 2022-01-19 NOTE — Therapy (Signed)
?OUTPATIENT PHYSICAL THERAPY TREATMENT NOTE / PROGRESS NOTE ? ?Patient Name: Dustin Parks ?MRN: [id number] ?DOB:[birth year], 21 y.o., male ?Today's Date: 01/19/2022 ? ?PCP: Mliss Sax, MD ?REFERRING PROVIDER: Dr Maricela Bo  ? ? PT End of Session - 01/19/22 1337   ? ? Visit Number 15   ? Number of Visits 32   ? Date for PT Re-Evaluation 03/16/22   ? PT Start Time 1322   ? PT Stop Time 1409   ? PT Time Calculation (min) 47 min   ? Activity Tolerance Patient tolerated treatment well   ? Behavior During Therapy [facility] for tasks assessed/performed   ? ?  ?  ? ?  ? ? ? ? ? ? ? ? ?Past Medical History:  ?Diagnosis Date  ? Asthma   ? out grown now   ? ?Past Surgical History:  ?Procedure Laterality Date  ? KNEE ARTHROSCOPY WITH ANTERIOR CRUCIATE LIGAMENT (ACL) REPAIR WITH HAMSTRING GRAFT Left 11/08/2021  ? Procedure: LEFT KNEE ANTERIOR CRUCIATE LIGAMENT RECONSTRUCTION WITH QUADRICEPS TENDON AUTOGRAFT;  Surgeon: Huel Cote, MD;  Location: [facility];  Service: Orthopedics;  Laterality: Left;  ? KNEE ARTHROSCOPY WITH MENISCAL REPAIR  11/08/2021  ? Procedure: LEFT LATERAL MENISCAL REPAIR;  Surgeon: Huel Cote, MD;  Location: [facility];  Service: Orthopedics;;  ? WISDOM TOOTH EXTRACTION  2022  ? ?Patient Active Problem List  ? Diagnosis Date Noted  ? Allergic reaction 12/13/2021  ? Left anterior cruciate ligament tear   ? Complex tear of lateral meniscus of left knee as current injury   ? Somatic dysfunction of spine, cervical 10/05/2021  ? Adjustment disorder 09/26/2021  ? Encounter for vasectomy counseling 09/26/2021  ? Neck pain 09/26/2021  ? History of orchitis 02/08/2021  ? ?  ?PCP: Mliss Sax, MD ?  ?REFERRING PROVIDER: Huel Cote, MD ?  ?REFERRING DIAG: B93.903E (ICD-10-CM) - Rupture of anterior cruciate ligament of left knee, initial encounter  ?     S92.330Q (ICD-10-CM) - Complex tear of lateral meniscus of left knee as current injury, initial  encounter      Z98.890 (ICD-10-CM) - S/P left knee arthroscopy  ?  ?THERAPY DIAG:  ?Left knee pain, unspecified chronicity ?  ?Stiffness of left knee, not elsewhere classified ?  ?Muscle weakness (generalized) ?  ?Difficulty in walking, not elsewhere classified ?  ?ONSET DATE: Injury 10/09/2021 / Surgery 11/08/2021 ?  ?SUBJECTIVE:  ?  ?SUBJECTIVE STATEMENT: ?-Pt is 9 weeks and 4 days s/p Left knee ACL reconstruction with quadriceps autograft and lateral meniscal repair.   Pt states MD wants pt to perform aquatic therapy and increase frequency to 3x/wk.  Pt states he may manipulate knee in 1 month if his flexion is not improving.    ?  ?Pt states he went to class the evening after prior Rx and sat for an extended amount of time.  He had increased pain when walking out of class.  ?Pt denies any adverse effects after prior Rx.  Pt states he performed his knee flexion exercises 2 times yesterday due to being in class.  Pt is limited with bending knee and has much difficulty with bending knee.  He states he is limited with mobility with everything that is related to bending knee.  Pt is not using the crutches much anymore with ambulation.   ?Pt did take tylenol before Rx.  Pt states it feels tight today. ? ?FUNCTIONAL IMPROVEMENTS:  ambulating and standing for longer duration.  Sitting in  chairs more comfortably.  Sitting in car and driving more comfortably. ?FUNCTIONAL DEFICITS:  floor transfers though is improving.  ROM.  Sitting in car for a long amount of time.  Transfers. Pain with ambulating an extended distance. Bed mobility ? ?PERTINENT HISTORY: ?11/08/2021 Left knee ACL reconstruction with quadriceps autograft and lateral meniscal repair.  Allergy to certain adhesives including tegaderm per pt report  ?  ?PAIN:  ?PAIN:  ?Are you having pain? No ?NPRS scale: 0/10 current, 3/10 worst pain ?Pain location: right lateral knee  ?Pain orientation: Right  ?PAIN TYPE: aching ?Pain description: intermittent  ?Aggravating  factors: use of the knee / falling off the bed  ?Relieving factors: rest   ?  ?PRECAUTIONS: Other: per Dr. Serena CroissantBokshan's ACL reconstruction with meniscal repair ?  ?WEIGHT BEARING RESTRICTIONS Yes  ?  ?  ?OCCUPATION: in culinary school   ?  ?PLOF: Independent; Pt was able to perform all of his ADLs/IADLs and functional mobility skills without difficulty or limitations.  Pt was able to perform work activities.  Pt ambulated without an AD with good stability without difficulty.  ?  ?PATIENT GOALS improved stability with leaning, to have the ability to run, to be able to bowl.  Return to PLOF.  ?  ?  ?OBJECTIVE:  ? ?  ?  ?TODAY'S TREATMENT: ?FOTO:  56 with a goal of 70 at visit #22.  ? ?L knee ROM: ? L knee flexion PROM:  85 deg ?L knee flexion AAROM:  89 deg ? ?STRENGTH:  pt has a 17 deg extensor lag with supine SLR ?  Hip abd:  5/5 MMT ?  Hip flexion:  4+/5 MMT ? ?GAIT:  Pt lacks TKE and heel strike with gait.  Pt ambulates with more of a foot flat upon initial contact.  Pt is improving with toe off though is still limited.  He continues to have a moderate limp with gait.  ? ? ?Therapeutic Exercise: ?Reviewed current function, HEP compliance, response to prior Rx, and pain level. ?Pt performed: ?-Recumbent bike with 1/4 - 1/2 revolutions x 6 mins ?-Tailgate knee flexion self stretch 5-10s 8x ?-Nu-step with just L LE on pedal and using R UE to stretch with 20-30 sec hold ?-Pt received L knee flex PROM in supine per pt tolerance and tissue tolerance. ?-Pt received L knee flexion PROM seated at EOT per pt tolerance and tissue tolerance. ?-Supine wall slides with assistance with extension phase ? ? ?Manual Therapy:  Pt received STM to L quad and rolling in sitting with leg hanging off of table and with knee flexed  Pt received 4D patellar mobs.  ? ? ?  ?PATIENT EDUCATION:  ?Education details:  Educated pt and encouraged pt in being compliant with HEP.   Educated pt on the importance of performing his stretching consistently  and frequently.  PT instructed pt he needs to perform his knee ROM exercises at least 3 times per day.  Objective findings and POC.   ?Person educated: Patient ?Education method: Explanation, Demonstration, Tactile cues, Verbal cues, and Handouts ?Education comprehension: verbalized understanding, returned demonstration, verbal cues required, tactile cues required, and needs further education ?  ?  ?HOME EXERCISE PROGRAM: ?Access Code: [email or web address] ?URL: [email or web address] ?Date: 11/13/2021 ?Prepared by: Aaron Edelmanrey Jupiter Boys ?  ? ?  ?  ?ASSESSMENT: ?  ?CLINICAL IMPRESSION: ?Pt has improved compliance with HEP though only performed HEP twice yesterday due to class.  He presents to Rx c/o'ing of knee feeling tighter today.  He does feel  tightness earlier with PROM with earlier hip compensation and has less tolerance with stretch today.  Though he is tighter today and has reduced tolerance to stretching, he is making progress with knee flexion ROM.  Pt nearly had 90 deg of AAROM today and gave good effort with all stretching and exercises. He has full knee extension ROM.  Pt continues to have quad weakness as evidenced by performance of supine SLR and gait deficits.  Pt has progressed with protocol by performing increased closed chain activities well without c/o's.  Pt's FOTO score has improved from 43 prior to 56 currently.  Pt should benefit from cont skilled PT services per protocol to improve ROM, address ongoing goals and to restore PLOF.  ? ?   ? ?Objective impairments include Abnormal gait, decreased activity tolerance, decreased balance, decreased mobility, difficulty walking, decreased ROM, decreased strength, hypomobility, increased edema, impaired flexibility, and pain. These impairments are limiting patient from cleaning, community activity, driving, meal prep, occupation, laundry, shopping, school, and ambulation . ?  ?  ?REHAB POTENTIAL: Good ?  ?CLINICAL DECISION MAKING: Stable/uncomplicated ?   ?EVALUATION COMPLEXITY: Low ?  ?  ?GOALS: ?  ?  ?SHORT TERM GOALS: ?  ?STG Name Target Date Goal status  ?1 Pt will be independent and compliant with HEP for improved pain, strength, and ROM.  ?Baseline:  12/11/2021

## 2022-01-19 NOTE — Progress Notes (Signed)
? ?                            ? ? ?Chief Complaint: Postop follow-up ?  ? ?Left knee ACL reconstruction with quadriceps tendon autograft and lateral meniscal repair done January 4th, 2023 ? ?History of Present Illness:  ? ?01/19/2022: Presents today for follow-up status post the above procedure.  He is making improvements in physical therapy.  He has been able to get to approximately 90 degrees range of motion with his physical therapist today.  This time he has no pain with walking.  He does feel somewhat as though he is plateaued. ? ? ?Surgical History:   ?None ? ?PMH/PSH/Family History/Social History/Meds/Allergies:   ? ?Past Medical History:  ?Diagnosis Date  ? Asthma   ? out grown now   ? ?Past Surgical History:  ?Procedure Laterality Date  ? KNEE ARTHROSCOPY WITH ANTERIOR CRUCIATE LIGAMENT (ACL) REPAIR WITH HAMSTRING GRAFT Left 11/08/2021  ? Procedure: LEFT KNEE ANTERIOR CRUCIATE LIGAMENT RECONSTRUCTION WITH QUADRICEPS TENDON AUTOGRAFT;  Surgeon: Huel Cote, MD;  Location: [facility];  Service: Orthopedics;  Laterality: Left;  ? KNEE ARTHROSCOPY WITH MENISCAL REPAIR  11/08/2021  ? Procedure: LEFT LATERAL MENISCAL REPAIR;  Surgeon: Huel Cote, MD;  Location: [facility];  Service: Orthopedics;;  ? WISDOM TOOTH EXTRACTION  2022  ? ?Social History  ? ?Socioeconomic History  ? Marital status: Single  ?  Spouse name: Not on file  ? Number of children: Not on file  ? Years of education: Not on file  ? Highest education level: Not on file  ?Occupational History  ? Not on file  ?Tobacco Use  ? Smoking status: Never  ? Smokeless tobacco: Never  ?Vaping Use  ? Vaping Use: Never used  ?Substance and Sexual Activity  ? Alcohol use: Not Currently  ?  Comment: rare  ? Drug use: Yes  ?  Types: Marijuana  ?  Comment: social  ? Sexual activity: Yes  ?Other Topics Concern  ? Not on file  ?Social History Narrative  ? Not on file  ? ?Social Determinants of Health  ? ?Financial Resource Strain:  Not on file  ?Food Insecurity: Not on file  ?Transportation Needs: Not on file  ?Physical Activity: Not on file  ?Stress: Not on file  ?Social Connections: Not on file  ? ?Family History  ?Problem Relation Age of Onset  ? Healthy Mother   ? Healthy Father   ? Healthy Maternal Grandmother   ? Healthy Paternal Grandfather   ? ?No Known Allergies ?Current Outpatient Medications  ?Medication Sig Dispense Refill  ? aspirin EC 325 MG tablet Take 1 tablet (325 mg total) by mouth daily. 30 tablet 0  ? EPINEPHrine 0.3 mg/0.3 mL IJ SOAJ injection Inject 0.3 mg into the muscle as needed for anaphylaxis. 2 each 1  ? predniSONE (DELTASONE) 10 MG tablet Take 6 tablets today, then 5 tablets tomorrow, then decrease by 1 tablet every day until gone 21 tablet 0  ? ?No current facility-administered medications for this visit.  ? ?No results found. ? ?Review of Systems:   ?A ROS was performed including pertinent positives and negatives as documented in the HPI. ? ?Physical Exam :   ?Constitutional: NAD and appears stated age ?Neurological: Alert and oriented ?Psych: Appropriate affect and cooperative ?There were no vitals taken for this visit.  ? ?Comprehensive Musculoskeletal Exam:   ?  ?Incisions are well-healing.  He is able to achieve full extension.  Flexion is to approximately 90 degrees.  There is quad atrophy with decreased tone although this is improved since last visit.  Negative Lachman.  No lateral joint line tenderness.  No tenderness about the knee.  Mild swelling.  Otherwise neurosensory exam is intact. ?Imaging:   ? ?I personally reviewed and interpreted the radiographs. ? ? ?Assessment:   ?21 year old male status post ACL reconstruction with quadriceps tendon autograft as well as lateral meniscal tear repair.  At this time he is working diligently on range of motion about the knee but is unfortunately not been able to achieve more than 90 degrees of flexion.  At this time he is nearly 3 months postop following his ACL  reconstruction unfortunately I have advised that I do not believe it is likely for him to regain this additional motion without lysis of adhesions and manipulation under anesthesia.  He understands this and would like to proceed. ?Plan :   ? ?-Plan for left knee arthroscopy with manipulation under anesthesia and lysis of adhesions ? ? ?After a lengthy discussion of treatment options, including risks, benefits, alternatives, complications of surgical and nonsurgical conservative options, the patient elected surgical repair.  ? ?The patient  is aware of the material risks  and complications including, but not limited to injury to adjacent structures, neurovascular injury, infection, numbness, bleeding, implant failure, thermal burns, stiffness, persistent pain, failure to heal, disease transmission from allograft, need for further surgery, dislocation, anesthetic risks, blood clots, risks of death,and others. The probabilities of surgical success and failure discussed with patient given their particular co-morbidities.The time and nature of expected rehabilitation and recovery was discussed.The patient's questions were all answered preoperatively.  No barriers to understanding were noted. ?I explained the natural history of the disease process and Rx rationale.  I explained to the patient what I considered to be reasonable expectations given their personal situation.  The final treatment plan was arrived at through a shared patient decision making process model. ? ? ? ? ? ? ?I personally saw and evaluated the patient, and participated in the management and treatment plan. ? ?Huel Cote, MD ?Attending Physician, Orthopedic Surgery ? ?This document was dictated using Conservation officer, historic buildings. A reasonable attempt at proof reading has been made to minimize errors. ? ?

## 2022-01-19 NOTE — H&P (View-Only) (Signed)
? ?                            ? ? ?Chief Complaint: Postop follow-up ?  ? ?Left knee ACL reconstruction with quadriceps tendon autograft and lateral meniscal repair done January 4th, 2023 ? ?History of Present Illness:  ? ?01/19/2022: Presents today for follow-up status post the above procedure.  He is making improvements in physical therapy.  He has been able to get to approximately 90 degrees range of motion with his physical therapist today.  This time he has no pain with walking.  He does feel somewhat as though he is plateaued. ? ? ?Surgical History:   ?None ? ?PMH/PSH/Family History/Social History/Meds/Allergies:   ? ?Past Medical History:  ?Diagnosis Date  ? Asthma   ? out grown now   ? ?Past Surgical History:  ?Procedure Laterality Date  ? KNEE ARTHROSCOPY WITH ANTERIOR CRUCIATE LIGAMENT (ACL) REPAIR WITH HAMSTRING GRAFT Left 11/08/2021  ? Procedure: LEFT KNEE ANTERIOR CRUCIATE LIGAMENT RECONSTRUCTION WITH QUADRICEPS TENDON AUTOGRAFT;  Surgeon: Mattson Dayal, MD;  Location: [facility];  Service: Orthopedics;  Laterality: Left;  ? KNEE ARTHROSCOPY WITH MENISCAL REPAIR  11/08/2021  ? Procedure: LEFT LATERAL MENISCAL REPAIR;  Surgeon: Allegra Cerniglia, MD;  Location: [facility];  Service: Orthopedics;;  ? WISDOM TOOTH EXTRACTION  2022  ? ?Social History  ? ?Socioeconomic History  ? Marital status: Single  ?  Spouse name: Not on file  ? Number of children: Not on file  ? Years of education: Not on file  ? Highest education level: Not on file  ?Occupational History  ? Not on file  ?Tobacco Use  ? Smoking status: Never  ? Smokeless tobacco: Never  ?Vaping Use  ? Vaping Use: Never used  ?Substance and Sexual Activity  ? Alcohol use: Not Currently  ?  Comment: rare  ? Drug use: Yes  ?  Types: Marijuana  ?  Comment: social  ? Sexual activity: Yes  ?Other Topics Concern  ? Not on file  ?Social History Narrative  ? Not on file  ? ?Social Determinants of Health  ? ?Financial Resource Strain:  Not on file  ?Food Insecurity: Not on file  ?Transportation Needs: Not on file  ?Physical Activity: Not on file  ?Stress: Not on file  ?Social Connections: Not on file  ? ?Family History  ?Problem Relation Age of Onset  ? Healthy Mother   ? Healthy Father   ? Healthy Maternal Grandmother   ? Healthy Paternal Grandfather   ? ?No Known Allergies ?Current Outpatient Medications  ?Medication Sig Dispense Refill  ? aspirin EC 325 MG tablet Take 1 tablet (325 mg total) by mouth daily. 30 tablet 0  ? EPINEPHrine 0.3 mg/0.3 mL IJ SOAJ injection Inject 0.3 mg into the muscle as needed for anaphylaxis. 2 each 1  ? predniSONE (DELTASONE) 10 MG tablet Take 6 tablets today, then 5 tablets tomorrow, then decrease by 1 tablet every day until gone 21 tablet 0  ? ?No current facility-administered medications for this visit.  ? ?No results found. ? ?Review of Systems:   ?A ROS was performed including pertinent positives and negatives as documented in the HPI. ? ?Physical Exam :   ?Constitutional: NAD and appears stated age ?Neurological: Alert and oriented ?Psych: Appropriate affect and cooperative ?There were no vitals taken for this visit.  ? ?Comprehensive Musculoskeletal Exam:   ?  ?Incisions are well-healing.    He is able to achieve full extension.  Flexion is to approximately 90 degrees.  There is quad atrophy with decreased tone although this is improved since last visit.  Negative Lachman.  No lateral joint line tenderness.  No tenderness about the knee.  Mild swelling.  Otherwise neurosensory exam is intact. ?Imaging:   ? ?I personally reviewed and interpreted the radiographs. ? ? ?Assessment:   ?21 year old male status post ACL reconstruction with quadriceps tendon autograft as well as lateral meniscal tear repair.  At this time he is working diligently on range of motion about the knee but is unfortunately not been able to achieve more than 90 degrees of flexion.  At this time he is nearly 3 months postop following his ACL  reconstruction unfortunately I have advised that I do not believe it is likely for him to regain this additional motion without lysis of adhesions and manipulation under anesthesia.  He understands this and would like to proceed. ?Plan :   ? ?-Plan for left knee arthroscopy with manipulation under anesthesia and lysis of adhesions ? ? ?After a lengthy discussion of treatment options, including risks, benefits, alternatives, complications of surgical and nonsurgical conservative options, the patient elected surgical repair.  ? ?The patient  is aware of the material risks  and complications including, but not limited to injury to adjacent structures, neurovascular injury, infection, numbness, bleeding, implant failure, thermal burns, stiffness, persistent pain, failure to heal, disease transmission from allograft, need for further surgery, dislocation, anesthetic risks, blood clots, risks of death,and others. The probabilities of surgical success and failure discussed with patient given their particular co-morbidities.The time and nature of expected rehabilitation and recovery was discussed.The patient's questions were all answered preoperatively.  No barriers to understanding were noted. ?I explained the natural history of the disease process and Rx rationale.  I explained to the patient what I considered to be reasonable expectations given their personal situation.  The final treatment plan was arrived at through a shared patient decision making process model. ? ? ? ? ? ? ?I personally saw and evaluated the patient, and participated in the management and treatment plan. ? ?Huel Cote, MD ?Attending Physician, Orthopedic Surgery ? ?This document was dictated using Conservation officer, historic buildings. A reasonable attempt at proof reading has been made to minimize errors. ? ?

## 2022-01-19 NOTE — Addendum Note (Signed)
Addended by: Yevonne Pax on: 01/19/2022 03:39 PM ? ? Modules accepted: Orders ? ?

## 2022-01-22 ENCOUNTER — Encounter (HOSPITAL_BASED_OUTPATIENT_CLINIC_OR_DEPARTMENT_OTHER): Payer: Self-pay | Admitting: Orthopaedic Surgery

## 2022-01-22 ENCOUNTER — Other Ambulatory Visit: Payer: Self-pay

## 2022-01-22 ENCOUNTER — Ambulatory Visit: Payer: No Typology Code available for payment source | Admitting: Physical Therapy

## 2022-01-22 ENCOUNTER — Encounter: Payer: Self-pay | Admitting: Physical Therapy

## 2022-01-22 DIAGNOSIS — M25662 Stiffness of left knee, not elsewhere classified: Secondary | ICD-10-CM

## 2022-01-22 DIAGNOSIS — M25562 Pain in left knee: Secondary | ICD-10-CM

## 2022-01-22 DIAGNOSIS — M6281 Muscle weakness (generalized): Secondary | ICD-10-CM

## 2022-01-22 DIAGNOSIS — R262 Difficulty in walking, not elsewhere classified: Secondary | ICD-10-CM

## 2022-01-22 NOTE — Therapy (Signed)
?OUTPATIENT PHYSICAL THERAPY TREATMENT NOTE / PROGRESS NOTE ? ?Patient Name: Dustin Parks ?MRN: [id number] ?DOB:2001/01/06, 21 y.o., male ?Today's Date: 01/22/2022 ? ?PCP: Libby Maw, MD ?REFERRING PROVIDER: Dr Donivan Scull  ? ? PT End of Session - 01/22/22 0800   ? ? Visit Number 17   ? Number of Visits 32   ? Date for PT Re-Evaluation 03/16/22   ? PT Start Time 0800   ? PT Stop Time 0845   ? PT Time Calculation (min) 45 min   ? Activity Tolerance Patient tolerated treatment well   ? Behavior During Therapy [facility] for tasks assessed/performed   ? ?  ?  ? ?  ? ? ? ? ? ? ? ? ? ?Past Medical History:  ?Diagnosis Date  ? Asthma   ? out grown now   ? ?Past Surgical History:  ?Procedure Laterality Date  ? KNEE ARTHROSCOPY WITH ANTERIOR CRUCIATE LIGAMENT (ACL) REPAIR WITH HAMSTRING GRAFT Left 11/08/2021  ? Procedure: LEFT KNEE ANTERIOR CRUCIATE LIGAMENT RECONSTRUCTION WITH QUADRICEPS TENDON AUTOGRAFT;  Surgeon: [name], MD;  Location: [place];  Service: Orthopedics;  Laterality: Left;  ? KNEE ARTHROSCOPY WITH MENISCAL REPAIR  11/08/2021  ? Procedure: LEFT LATERAL MENISCAL REPAIR;  Surgeon: [name], MD;  Location: [place];  Service: Orthopedics;;  ? [facility] EXTRACTION  2022  ? ?Patient Active Problem List  ? Diagnosis Date Noted  ? Allergic reaction 12/13/2021  ? Left anterior cruciate ligament tear   ? Complex tear of lateral meniscus of left knee as current injury   ? Somatic dysfunction of spine, cervical 10/05/2021  ? Adjustment disorder 09/26/2021  ? Encounter for vasectomy counseling 09/26/2021  ? Neck pain 09/26/2021  ? History of orchitis 02/08/2021  ? ?  ?PCP: Libby Maw, MD ?  ?REFERRING PROVIDER: [name], MD ?  ?REFERRING DIAG: [id number] (ICD-10-CM) - Rupture of anterior cruciate ligament of left knee, initial encounter  ?     [id number] (ICD-10-CM) - Complex tear of lateral meniscus of left knee as current injury, initial  encounter      Z98.890 (ICD-10-CM) - S/P left knee arthroscopy  ?  ?THERAPY DIAG:  ?Left knee pain, unspecified chronicity ?  ?Stiffness of left knee, not elsewhere classified ?  ?Muscle weakness (generalized) ?  ?Difficulty in walking, not elsewhere classified ?  ?ONSET DATE: Injury 10/09/2021 / Surgery 11/08/2021 ?  ?SUBJECTIVE:  ?  ?SUBJECTIVE STATEMENT: ?-Pt is 10 weeks and 2 days s/p Left knee ACL reconstruction with quadriceps autograft and lateral meniscal repair.  Pt is set up to have a MAU on the L knee on 01/25/2022 ?  ?"I've continued to feel a lot of fatigue in my calf, the MD told me to take off my brace and not to put it back on." ? ?PERTINENT HISTORY: ?11/08/2021 Left knee ACL reconstruction with quadriceps autograft and lateral meniscal repair.  Allergy to certain adhesives including tegaderm per pt report  ?  ?PAIN:  ?PAIN:  ?Are you having pain? No ?NPRS scale: 0/10 current, 3/10 worst pain ?Pain location: right lateral knee  ?Pain orientation: Right  ?PAIN TYPE: aching ?Pain description: intermittent  ?Aggravating factors: use of the knee / falling off the bed  ?Relieving factors: rest   ?  ?PRECAUTIONS: Other: per Dr. Eddie Dibbles ACL reconstruction with meniscal repair ?  ?WEIGHT BEARING RESTRICTIONS Yes  ?  ?  ?OCCUPATION: in culinary school   ?  ?PLOF: Independent; Pt was able to perform  all of his ADLs/IADLs and functional mobility skills without difficulty or limitations.  Pt was able to perform work activities.  Pt ambulated without an AD with good stability without difficulty.  ?  ?PATIENT GOALS improved stability with leaning, to have the ability to run, to be able to bowl.  Return to PLOF.  ?  ?  ?OBJECTIVE:  ? ?  ?  ?TODAY'S TREATMENT: ?[facility] Adult PT Treatment:                                                DATE: 01/22/2022 ?Following manual pt increased AAROM flexion in sitting to 92 with minimal L hip hiking noted ? ? ?Therapeutic Exercise: ?PNF contract/ relax qud stretch 2 x 30  ?Seated  hamstring curl 2 x 15 with GTB ?Recumbent bike 1/2 revolution sx 5 min - trialed manual medial patellar glide during bike which increased his flexion and he was able to achieve full backward revolutions with a minimal lifting of the L hip.  And continued biking x 4 minutes with full revolutions backward ?Manual Therapy: ?MTPR along the rectus femoris x 3 ?PA tibiofemoral mobs grade IV x 4 bouts of 1 min ea. Flexing knee between bouts and blocking foot from allowing knee to extend ?PROM knee flexion while seated on hi-lo table working on flexion with distraction gradually working into more flexion. ?Flexion of knee while blocking popliteal space with active hamstring flexion combined with manual over pressure and gentle oscillations ?Self Care: ?Addressed patients questions regarding the upcoming manipulation general expectations following manipulations and weight bearing as much as able, using crutches for safety. Importance of continuing to work on bending the knee as much as possible and to use ice frequently to help with pain. Getting up and moving around periodically to reduce stiffness. If using ice 15 min on and 45 min off if using it consecutively.  ? ? ?FOTO:  56 with a goal of 70 at visit #22.  ? ?L knee ROM: ? L knee flexion PROM:  85 deg ?L knee flexion AAROM:  89 deg ? ?STRENGTH:  pt has a 17 deg extensor lag with supine SLR ?  Hip abd:  5/5 MMT ?  Hip flexion:  4+/5 MMT ? ?GAIT:  Pt lacks TKE and heel strike with gait.  Pt ambulates with more of a foot flat upon initial contact.  Pt is improving with toe off though is still limited.  He continues to have a moderate limp with gait.  ? ? ?Therapeutic Exercise: ?Reviewed current function, HEP compliance, response to prior Rx, and pain level. ?Pt performed: ?-Recumbent bike with 1/4 - 1/2 revolutions x 6 mins ?-[place] knee flexion self stretch 5-10s 8x ?-Nu-step with just L LE on pedal and using R UE to stretch with 20-30 sec hold ?-Pt received L knee  flex PROM in supine per pt tolerance and tissue tolerance. ?-Pt received L knee flexion PROM seated at EOT per pt tolerance and tissue tolerance. ?-Supine wall slides with assistance with extension phase ? ? ?Manual Therapy:  Pt received STM to L quad and rolling in sitting with leg hanging off of table and with knee flexed  Pt received 4D patellar mobs.  ? ? ?  ?PATIENT EDUCATION:  ?Education details:  Educated pt and encouraged pt in being compliant with HEP.   Educated pt on the  importance of performing his stretching consistently and frequently.  PT instructed pt he needs to perform his knee ROM exercises at least 3 times per day.  Objective findings and POC.   ?Person educated: Patient ?Education method: Explanation, Demonstration, Tactile cues, Verbal cues, and Handouts ?Education comprehension: verbalized understanding, returned demonstration, verbal cues required, tactile cues required, and needs further education ?  ?  ?HOME EXERCISE PROGRAM: ?Access Code: [email or web address] ?URL: [email or web address] ?Date: 11/13/2021 ?Prepared by: Ronny Flurry ?  ? ?  ?  ?ASSESSMENT: ?  ?CLINICAL IMPRESSION: ?[place] presents to PT today noting no pain but continued stiffness predominately with flexion. He saw the MD last week and is scheduled to undergo a MAU on 01/25/2022. Continued working on knee flexion and hamstring activation to help maintain ranges achieved in therapy. He was able to get in sitting 92 degrees AAROM knee flexion with some mild hip compensation. Next session plan to reassess due to upcoming surgery. He was able to get full backward revolutions on the bike today with some hip compensation noted in combination with manual medial patellar glide. End of session he noted feeling reduction in stiffness with gait.  ?   ? ?Objective impairments include Abnormal gait, decreased activity tolerance, decreased balance, decreased mobility, difficulty walking, decreased ROM, decreased strength, hypomobility,  increased edema, impaired flexibility, and pain. These impairments are limiting patient from cleaning, community activity, driving, meal prep, occupation, laundry, shopping, school, and ambulation . ?  ?  ?REHAB

## 2022-01-24 ENCOUNTER — Encounter (HOSPITAL_COMMUNITY): Payer: Self-pay | Admitting: Anesthesiology

## 2022-01-24 ENCOUNTER — Encounter (HOSPITAL_BASED_OUTPATIENT_CLINIC_OR_DEPARTMENT_OTHER): Payer: Self-pay | Admitting: Physical Therapy

## 2022-01-24 NOTE — Anesthesia Preprocedure Evaluation (Deleted)
Anesthesia Evaluation  ? ? ?Reviewed: ?Allergy & Precautions, Patient's Chart, lab work & pertinent test results ? ?History of Anesthesia Complications ?Negative for: history of anesthetic complications ? ?Airway ? ? ? ? ? ? ? Dental ?  ?Pulmonary ?Patient abstained from smoking.,  ?  ? ? ? ? ? ? ? Cardiovascular ?negative cardio ROS ? ? ? ? ?  ?Neuro/Psych ?PSYCHIATRIC DISORDERS negative neurological ROS ?   ? GI/Hepatic ?negative GI ROS, (+)  ?  ? substance abuse (last night) ? marijuana use,   ?Endo/Other  ?Morbid obesity ? Renal/GU ?negative Renal ROS  ? ?  ?Musculoskeletal ? ? Abdominal ?(+) + obese,   ?Peds ? Hematology ?negative hematology ROS ?(+)   ?Anesthesia Other Findings ? ? Reproductive/Obstetrics ? ?  ? ? ? ? ? ? ? ? ? ? ? ? ? ?  ?  ? ? ? ? ? ? ? ? ?Anesthesia Physical ? ?Anesthesia Plan ? ?ASA: 2 ? ?Anesthesia Plan: General and Regional  ? ?Post-op Pain Management: Regional block and Tylenol PO (pre-op)  ? ?Induction: Intravenous ? ?PONV Risk Score and Plan: 2 and Ondansetron and Dexamethasone ? ?Airway Management Planned: LMA ? ?Additional Equipment: None ? ?Intra-op Plan:  ? ?Post-operative Plan:  ? ?Informed Consent: I have reviewed the patients History and Physical, chart, labs and discussed the procedure including the risks, benefits and alternatives for the proposed anesthesia with the patient or authorized representative who has indicated his/her understanding and acceptance.  ? ? ? ?Dental advisory given ? ?Plan Discussed with: CRNA, Surgeon and Anesthesiologist ? ?Anesthesia Plan Comments: (Plan routine monitors, GA with adductor canal block for post op analgesia)  ? ? ? ? ? ? ?Anesthesia Quick Evaluation ? ?

## 2022-01-25 ENCOUNTER — Ambulatory Visit (HOSPITAL_BASED_OUTPATIENT_CLINIC_OR_DEPARTMENT_OTHER)
Admission: RE | Admit: 2022-01-25 | Payer: No Typology Code available for payment source | Source: Home / Self Care | Admitting: Orthopaedic Surgery

## 2022-01-25 DIAGNOSIS — M24662 Ankylosis, left knee: Secondary | ICD-10-CM

## 2022-01-25 SURGERY — ARTHROSCOPY, KNEE
Anesthesia: Regional | Site: Knee | Laterality: Left

## 2022-01-26 ENCOUNTER — Ambulatory Visit (HOSPITAL_BASED_OUTPATIENT_CLINIC_OR_DEPARTMENT_OTHER): Payer: No Typology Code available for payment source | Admitting: Physical Therapy

## 2022-01-26 ENCOUNTER — Encounter (HOSPITAL_BASED_OUTPATIENT_CLINIC_OR_DEPARTMENT_OTHER): Payer: Self-pay | Admitting: Physical Therapy

## 2022-01-26 ENCOUNTER — Other Ambulatory Visit: Payer: Self-pay

## 2022-01-26 DIAGNOSIS — M25662 Stiffness of left knee, not elsewhere classified: Secondary | ICD-10-CM

## 2022-01-26 DIAGNOSIS — M25562 Pain in left knee: Secondary | ICD-10-CM

## 2022-01-26 DIAGNOSIS — R262 Difficulty in walking, not elsewhere classified: Secondary | ICD-10-CM

## 2022-01-26 DIAGNOSIS — M6281 Muscle weakness (generalized): Secondary | ICD-10-CM

## 2022-01-26 NOTE — Therapy (Signed)
?OUTPATIENT PHYSICAL THERAPY TREATMENT NOTE / PROGRESS NOTE ? ?Patient Name: Dustin Parks ?MRN: [id number] ?DOB:[birth year], 21 y.o., male ?Today's Date: 01/26/2022 ? ?PCP: Pcp, No ?REFERRING PROVIDER: Dr Donivan Scull  ? ? PT End of Session - 01/26/22 2213   ? ? Visit Number 18   ? Number of Visits 32   ? Date for PT Re-Evaluation 03/16/22   ? PT Start Time 1318   ? PT Stop Time 6803   ? PT Time Calculation (min) 45 min   ? Activity Tolerance Patient tolerated treatment well   ? Behavior During Therapy [facility] for tasks assessed/performed   ? ?  ?  ? ?  ? ? ? ? ? ? ? ? ? ? ?Past Medical History:  ?Diagnosis Date  ? Asthma   ? out grown now   ? ?Past Surgical History:  ?Procedure Laterality Date  ? KNEE ARTHROSCOPY WITH ANTERIOR CRUCIATE LIGAMENT (ACL) REPAIR WITH HAMSTRING GRAFT Left 11/08/2021  ? Procedure: LEFT KNEE ANTERIOR CRUCIATE LIGAMENT RECONSTRUCTION WITH QUADRICEPS TENDON AUTOGRAFT;  Surgeon: [name], MD;  Location: [place];  Service: Orthopedics;  Laterality: Left;  ? KNEE ARTHROSCOPY WITH MENISCAL REPAIR  11/08/2021  ? Procedure: LEFT LATERAL MENISCAL REPAIR;  Surgeon: [name], MD;  Location: [place];  Service: Orthopedics;;  ? [place] EXTRACTION  2022  ? ?Patient Active Problem List  ? Diagnosis Date Noted  ? Allergic reaction 12/13/2021  ? Left anterior cruciate ligament tear   ? Complex tear of lateral meniscus of left knee as current injury   ? Somatic dysfunction of spine, cervical 10/05/2021  ? Adjustment disorder 09/26/2021  ? Encounter for vasectomy counseling 09/26/2021  ? Neck pain 09/26/2021  ? History of orchitis 02/08/2021  ? ?  ?PCP: Libby Maw, MD ?  ?REFERRING PROVIDER: [name], MD ?  ?REFERRING DIAG: O12.248G (ICD-10-CM) - Rupture of anterior cruciate ligament of left knee, initial encounter  ?     N00.370W (ICD-10-CM) - Complex tear of lateral meniscus of left knee as current injury, initial encounter       Z98.890 (ICD-10-CM) - S/P left knee arthroscopy  ?  ?THERAPY DIAG:  ?Left knee pain, unspecified chronicity ?  ?Stiffness of left knee, not elsewhere classified ?  ?Muscle weakness (generalized) ?  ?Difficulty in walking, not elsewhere classified ?  ?ONSET DATE: Injury 10/09/2021 / Surgery 11/08/2021 ?  ?SUBJECTIVE:  ?  ?SUBJECTIVE STATEMENT: ?-Pt is 11 weeks and 2 days s/p Left knee ACL reconstruction with quadriceps autograft and lateral meniscal repair.  Pt is set up to have a MAU on the L knee on 01/25/2022 ? ?Pt states he overdid it yesterday being in class.  He was up on his feet for 8 hours and was only able to sit down twice during the 8 hour period.  He c/o's of increased knee pain bilat and worse in R knee.  He states his knee is tighter today and probably the most tight it has been in awhile.  Pt was scheduled for a manipulation yesterday.   He was unable to receive the manipulation due to insurance not authorizing it, but insurance has now authorized it.   ? ?PERTINENT HISTORY: ?11/08/2021 Left knee ACL reconstruction with quadriceps autograft and lateral meniscal repair.  Allergy to certain adhesives including tegaderm per pt report  ?  ?PAIN:  ?PAIN:  ?Are you having pain? No ?NPRS scale: 0/10 current, 3/10 worst pain ?Pain location: right lateral knee  ?Pain  orientation: Right  ?PAIN TYPE: aching ?Pain description: intermittent  ?Aggravating factors: use of the knee / falling off the bed  ?Relieving factors: rest   ?  ?PRECAUTIONS: Other: per Dr. Eddie Dibbles ACL reconstruction with meniscal repair ?  ?WEIGHT BEARING RESTRICTIONS Yes  ?  ?  ?OCCUPATION: in culinary school   ?  ?PLOF: Independent; Pt was able to perform all of his ADLs/IADLs and functional mobility skills without difficulty or limitations.  Pt was able to perform work activities.  Pt ambulated without an AD with good stability without difficulty.  ?  ?PATIENT GOALS improved stability with leaning, to have the ability to run, to be able to bowl.   Return to PLOF.  ?  ?  ?OBJECTIVE:  ? ?  ?  ?TODAY'S TREATMENT: ? ?L knee ROM: ? L knee flexion PROM:  83 deg ?L knee flexion AAROM:  83 deg ? ? ?GAIT:  Pt lacks TKE and heel strike with gait.  Pt ambulates with more of a foot flat upon initial contact.  Pt is improving with toe off though is still limited.   ? ? ?Therapeutic Exercise: ?Reviewed current function, HEP compliance, response to prior Rx, and pain level. ?Pt performed: ?-Recumbent bike with 1/4 - 1/2 revolutions x 6 mins ?-Nu-step with just L LE on pedal and using R UE to stretch with 3 x 1 min ?-Pt received L knee flexion PROM seated at EOT per pt tolerance and tissue tolerance. ?-Mini squats 2x10 reps with UE support ?-Wall sits 3x30 sec ?-Step ups on 6 inch step 2x10 reps ?-retro step ups on 4 inch step 2x10 reps ? ? ?Manual Therapy:  Pt received STM to L quad and rolling in sitting with leg hanging off of table and with knee flexed    ? ? ?  ?PATIENT EDUCATION:  ?Education details:  Educated pt on the importance of performing his stretching consistently and frequently.   Objective findings and POC.   ?Person educated: Patient ?Education method: Explanation, Demonstration, Tactile cues, Verbal cues, and Handouts ?Education comprehension: verbalized understanding, returned demonstration, verbal cues required, tactile cues required, and needs further education ?  ?  ?HOME EXERCISE PROGRAM: ?Access Code: [email or web address] ?URL: [email or web address] ?Date: 11/13/2021 ?Prepared by: Ronny Flurry ?  ? ?  ?  ?ASSESSMENT: ?  ?CLINICAL IMPRESSION: ?Pt presents to Rx today stating his knee feels tighter than it has in awhile.  Pt states he was on his feet for 8 hrs for school yesterday.  He continues to have significant tightness and limitations in knee flexion ROM.  He has pain and guarding with flexion PROM and compensates quickly with hip hike when stretching knee.  Pt's knee ROM was worse today and pt reports feeling a stretch earlier in ROM.  Pt was  scheduled to have manipulation yesterday though didn't have insurance authorization at the time.  Insurance has now authorized manipulation.  Pt performed exercises per protocol well with cuing for correct form.  Pt didn't report increased pain after Rx though had increased limp after stretching.   ? ? ?Objective impairments include Abnormal gait, decreased activity tolerance, decreased balance, decreased mobility, difficulty walking, decreased ROM, decreased strength, hypomobility, increased edema, impaired flexibility, and pain. These impairments are limiting patient from cleaning, community activity, driving, meal prep, occupation, laundry, shopping, school, and ambulation . ?  ?  ?REHAB POTENTIAL: Good ?  ?CLINICAL DECISION MAKING: Stable/uncomplicated ?  ?EVALUATION COMPLEXITY: Low ?  ?  ?GOALS: ?  ?  ?SHORT TERM GOALS: ?  ?  STG Name Target Date Goal status  ?1 Pt will be independent and compliant with HEP for improved pain, strength, and ROM.  ?Baseline:  12/11/2021 PARTIALLY MET  ?2 Pt will demo a good quad set and perform supine SLRs independently with brace for improved quad strength. ?Baseline:  11/27/2021 PROGRESSING  ?3 Pt will demo improved improved L knee extension PROM/AROM to 0/3 and flexion PROM to 60 deg for improved mobility and stiffness. ?Baseline: 12/04/2021 GOAL MET  ?4 Pt will demo improved improved L knee extension AROM to 0 deg and  flexion AAROM/PROM to 80/90 deg for improved mobility and stiffness. ?Baseline: 12/18/2021 PARTIALLY MET  ?5 Pt will demo L knee AROM to be 0 - 120 deg for improved stiffness and performance of functional mobility.   ?Baseline: 01/22/2022 INITIAL  ?6 Pt will progress Wb'ing with gait per MD orders and protocol without adverse effects.  ?Baseline: 12/18/2021 GOAL MET  ?7 Pt will progress with closed chain exercises per protocol without adverse effects for improved functional strength and stability.  ?Baseline: 12/25/2021 GOAL MET  ?8 Pt will score 0-1 on the lateral step  down test on a 4 inch step for improved quad eccentric control.   01/22/2022 ?  NOT ASSESSED  ?9 Pt will score 0-1 on the lateral step down test on a 6 inch step for improved quad eccentric control and per

## 2022-01-27 ENCOUNTER — Ambulatory Visit: Payer: No Typology Code available for payment source | Admitting: Physical Therapy

## 2022-01-29 ENCOUNTER — Other Ambulatory Visit: Payer: Self-pay

## 2022-01-29 ENCOUNTER — Encounter (HOSPITAL_BASED_OUTPATIENT_CLINIC_OR_DEPARTMENT_OTHER): Payer: Self-pay | Admitting: Orthopaedic Surgery

## 2022-01-29 ENCOUNTER — Encounter: Payer: Self-pay | Admitting: Physical Therapy

## 2022-01-29 ENCOUNTER — Ambulatory Visit: Payer: No Typology Code available for payment source | Admitting: Physical Therapy

## 2022-01-29 ENCOUNTER — Other Ambulatory Visit (HOSPITAL_BASED_OUTPATIENT_CLINIC_OR_DEPARTMENT_OTHER): Payer: Self-pay | Admitting: Orthopaedic Surgery

## 2022-01-29 DIAGNOSIS — M25562 Pain in left knee: Secondary | ICD-10-CM | POA: Diagnosis not present

## 2022-01-29 DIAGNOSIS — M25662 Stiffness of left knee, not elsewhere classified: Secondary | ICD-10-CM

## 2022-01-29 DIAGNOSIS — M6281 Muscle weakness (generalized): Secondary | ICD-10-CM

## 2022-01-29 DIAGNOSIS — R262 Difficulty in walking, not elsewhere classified: Secondary | ICD-10-CM

## 2022-01-29 DIAGNOSIS — M24662 Ankylosis, left knee: Secondary | ICD-10-CM

## 2022-01-29 NOTE — Therapy (Signed)
?OUTPATIENT PHYSICAL THERAPY TREATMENT NOTE / PROGRESS NOTE ? ?Patient Name: Dustin Parks ?MRN: [id number] ?DOB:08/27/2001, 21 y.o., male ?Today's Date: 01/29/2022 ? ?PCP: Pcp, No ?REFERRING PROVIDER: Dr Donivan Scull  ? ? PT End of Session - 01/29/22 0809   ? ? Visit Number 19   ? Number of Visits 32   ? Date for PT Re-Evaluation 03/16/22   ? PT Start Time 0809   pt arrived late  ? PT Stop Time 0848   ? PT Time Calculation (min) 39 min   ? Activity Tolerance Patient tolerated treatment well   ? Behavior During Therapy [facility] for tasks assessed/performed   ? ?  ?  ? ?  ? ? ? ? ? ? ? ? ? ? ? ?Past Medical History:  ?Diagnosis Date  ? Asthma   ? out grown now   ? ?Past Surgical History:  ?Procedure Laterality Date  ? KNEE ARTHROSCOPY WITH ANTERIOR CRUCIATE LIGAMENT (ACL) REPAIR WITH HAMSTRING GRAFT Left 11/08/2021  ? Procedure: LEFT KNEE ANTERIOR CRUCIATE LIGAMENT RECONSTRUCTION WITH QUADRICEPS TENDON AUTOGRAFT;  Surgeon: [name], MD;  Location: [facility];  Service: Orthopedics;  Laterality: Left;  ? KNEE ARTHROSCOPY WITH MENISCAL REPAIR  11/08/2021  ? Procedure: LEFT LATERAL MENISCAL REPAIR;  Surgeon: [name], MD;  Location: [facility];  Service: Orthopedics;;  ? [place] EXTRACTION  2022  ? ?Patient Active Problem List  ? Diagnosis Date Noted  ? Allergic reaction 12/13/2021  ? Left anterior cruciate ligament tear   ? Complex tear of lateral meniscus of left knee as current injury   ? Somatic dysfunction of spine, cervical 10/05/2021  ? Adjustment disorder 09/26/2021  ? Encounter for vasectomy counseling 09/26/2021  ? Neck pain 09/26/2021  ? History of orchitis 02/08/2021  ? ?  ?PCP: Libby Maw, MD ?  ?REFERRING PROVIDER: [name], MD ?  ?REFERRING DIAG: U88.916X (ICD-10-CM) - Rupture of anterior cruciate ligament of left knee, initial encounter  ?     I50.388E (ICD-10-CM) - Complex tear of lateral meniscus of left knee as current injury, initial  encounter      Z98.890 (ICD-10-CM) - S/P left knee arthroscopy  ?  ?THERAPY DIAG:  ?Left knee pain, unspecified chronicity ?  ?Stiffness of left knee, not elsewhere classified ?  ?Muscle weakness (generalized) ?  ?Difficulty in walking, not elsewhere classified ?  ?ONSET DATE: Injury 10/09/2021 / Surgery 11/08/2021 ?  ?SUBJECTIVE:  ?  ?SUBJECTIVE STATEMENT: ?-Pt is 11 weeks and 5 days s/p Left knee ACL reconstruction with quadriceps autograft and lateral meniscal repair.  Pt is set up to have a MAU on the L knee on the week of 01/29/2022 ? ?"On Thursday I walked for about 7 hours and since then it has been tight and painful. Sitting and resting pain 0/10, walking 2-3/10." ? ?PERTINENT HISTORY: ?11/08/2021 Left knee ACL reconstruction with quadriceps autograft and lateral meniscal repair.  Allergy to certain adhesives including tegaderm per pt report  ?  ?PAIN:  ?PAIN:  ?Are you having pain? No ?NPRS scale: 0/10 current, 3/10 worst pain ?Pain location: right lateral knee  ?Pain orientation: Right  ?PAIN TYPE: aching ?Pain description: intermittent  ?Aggravating factors: use of the knee / falling off the bed  ?Relieving factors: rest   ?  ?PRECAUTIONS: Other: per Dr. Eddie Dibbles ACL reconstruction with meniscal repair ?  ?WEIGHT BEARING RESTRICTIONS Yes  ?  ?  ?OCCUPATION: in culinary school   ?  ?PLOF: Independent; Pt was  able to perform all of his ADLs/IADLs and functional mobility skills without difficulty or limitations.  Pt was able to perform work activities.  Pt ambulated without an AD with good stability without difficulty.  ?  ?PATIENT GOALS improved stability with leaning, to have the ability to run, to be able to bowl.  Return to PLOF.  ?  ?  ?OBJECTIVE:  ? ?  ?  ?TODAY'S TREATMENT: ?[facility] Adult PT Treatment:                                                DATE: 3/27/202 ?Before treatment  total arc 10 - 67 ?After treatment total arc 6 - 75 ? ?Therapeutic Exercise: ?Modified bridge with legs on elevated bolsters 2 x  12 ?SLR combined with quad set 2 x 10 - (pt had quad lag of 20 degrees) ?Leg press 2 x 12 40#, 1 set bil LE con/ecc, 2nd set con bil LE/ ECC LLE  ?Manual Therapy: ?STW along the rectus femoris  ?AP grade III with pt flexing knee intermittently and blocking the knee from extending ?Antegrade / retrograde massage to reduce edema ?Tibiofemoral distraction grade IV in sitting.  ? ? ?L knee ROM: ? L knee flexion PROM:  83 deg ?L knee flexion AAROM:  83 deg ? ? ?GAIT:  Pt lacks TKE and heel strike with gait.  Pt ambulates with more of a foot flat upon initial contact.  Pt is improving with toe off though is still limited.   ? ? ?Therapeutic Exercise: ?Reviewed current function, HEP compliance, response to prior Rx, and pain level. ?Pt performed: ?-Recumbent bike with 1/4 - 1/2 revolutions x 6 mins ?-Nu-step with just L LE on pedal and using R UE to stretch with 3 x 1 min ?-Pt received L knee flexion PROM seated at EOT per pt tolerance and tissue tolerance. ?-Mini squats 2x10 reps with UE support ?-Wall sits 3x30 sec ?-Step ups on 6 inch step 2x10 reps ?-retro step ups on 4 inch step 2x10 reps ? ? ?Manual Therapy:  Pt received STM to L quad and rolling in sitting with leg hanging off of table and with knee flexed    ? ? ?  ?PATIENT EDUCATION:  ?Education details:  reviewed gait biomechanics and forming a good habit to stop when he notices he is limping or overutilizing his RLE and reset utilizing the proper form with heel strike/ toe off.  ?Person educated: Patient ?Education method: Explanation, Demonstration, Tactile cues, Verbal cues, and Handouts ?Education comprehension: verbalized understanding, returned demonstration, verbal cues required, tactile cues required, and needs further education ?  ?  ?HOME EXERCISE PROGRAM: ?Access Code: [email or web address] ?URL: [email or web address] ?Date: 11/13/2021 ?Prepared by: Ronny Flurry ?  ? ?  ?  ?ASSESSMENT: ?  ?CLINICAL IMPRESSION: ?Pt presents to PT today noting  increased soreness / swelling today following increaesd time standing in class last week. He exhibits increased swelling in the knee with a reduction in knee AROM/ PROM. Continued STW to help reduce edema and mobs to continue addressing flexion. He completed all exercises prescribed today but did report soreness/ stiffness throughout session. He was scheduled to have a manipulation but it was cancelled due to lack of INS auth, he plans to get it sometime this week if possible. End of session he was able to get total arc of 6 - 75  degrees with report of pain/ tightness. End of session he denied modalities.  ? ?Objective impairments include Abnormal gait, decreased activity tolerance, decreased balance, decreased mobility, difficulty walking, decreased ROM, decreased strength, hypomobility, increased edema, impaired flexibility, and pain. These impairments are limiting patient from cleaning, community activity, driving, meal prep, occupation, laundry, shopping, school, and ambulation . ?  ?  ?REHAB POTENTIAL: Good ?  ?CLINICAL DECISION MAKING: Stable/uncomplicated ?  ?EVALUATION COMPLEXITY: Low ?  ?  ?GOALS: ?  ?  ?SHORT TERM GOALS: ?  ?STG Name Target Date Goal status  ?1 Pt will be independent and compliant with HEP for improved pain, strength, and ROM.  ?Baseline:  12/11/2021 PARTIALLY MET  ?2 Pt will demo a good quad set and perform supine SLRs independently with brace for improved quad strength. ?Baseline:  11/27/2021 PROGRESSING  ?3 Pt will demo improved improved L knee extension PROM/AROM to 0/3 and flexion PROM to 60 deg for improved mobility and stiffness. ?Baseline: 12/04/2021 GOAL MET  ?4 Pt will demo improved improved L knee extension AROM to 0 deg and  flexion AAROM/PROM to 80/90 deg for improved mobility and stiffness. ?Baseline: 12/18/2021 PARTIALLY MET  ?5 Pt will demo L knee AROM to be 0 - 120 deg for improved stiffness and performance of functional mobility.   ?Baseline: 01/22/2022 INITIAL  ?6 Pt will  progress Wb'ing with gait per MD orders and protocol without adverse effects.  ?Baseline: 12/18/2021 GOAL MET  ?7 Pt will progress with closed chain exercises per protocol without adverse effects for improved functi

## 2022-01-31 ENCOUNTER — Encounter: Payer: Self-pay | Admitting: Family Medicine

## 2022-01-31 ENCOUNTER — Ambulatory Visit (HOSPITAL_BASED_OUTPATIENT_CLINIC_OR_DEPARTMENT_OTHER): Payer: No Typology Code available for payment source | Admitting: Physical Therapy

## 2022-01-31 ENCOUNTER — Ambulatory Visit (INDEPENDENT_AMBULATORY_CARE_PROVIDER_SITE_OTHER): Payer: No Typology Code available for payment source | Admitting: Family Medicine

## 2022-01-31 VITALS — BP 118/72 | HR 68 | Temp 97.6°F | Ht 73.0 in | Wt 311.6 lb

## 2022-01-31 DIAGNOSIS — T783XXD Angioneurotic edema, subsequent encounter: Secondary | ICD-10-CM | POA: Diagnosis not present

## 2022-01-31 DIAGNOSIS — M6281 Muscle weakness (generalized): Secondary | ICD-10-CM

## 2022-01-31 DIAGNOSIS — M25562 Pain in left knee: Secondary | ICD-10-CM

## 2022-01-31 DIAGNOSIS — R262 Difficulty in walking, not elsewhere classified: Secondary | ICD-10-CM

## 2022-01-31 DIAGNOSIS — M24662 Ankylosis, left knee: Secondary | ICD-10-CM

## 2022-01-31 DIAGNOSIS — E781 Pure hyperglyceridemia: Secondary | ICD-10-CM

## 2022-01-31 DIAGNOSIS — Z9889 Other specified postprocedural states: Secondary | ICD-10-CM

## 2022-01-31 DIAGNOSIS — Z6841 Body Mass Index (BMI) 40.0 and over, adult: Secondary | ICD-10-CM

## 2022-01-31 DIAGNOSIS — M25662 Stiffness of left knee, not elsewhere classified: Secondary | ICD-10-CM

## 2022-01-31 NOTE — Progress Notes (Signed)
?[facility] ?[facility] ?[address] ?[place] [id number] ?Dept: [phone] ?Dept Fax: [phone] ? ?Transfer of Care Office Visit ? ?Subjective:  ? ? Patient ID: Dustin Parks, male    DOB: 05-10-2001, 21 y.o..   MRN: [id number] ? ?Chief Complaint  ?Patient presents with  ? Establish Care  ?  TOC-establish care,    ? ? ?History of Present Illness: ? ?Patient is in today to establish care. Mr. Dustin Parks was born in [place] and grew up in [place], [place]. 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 He will be finishing up with this int he next year. He is not currently working due to a knee injury. He is in a committed relationship and had the birth of a son 1 month ago. He denies tobacco use. He rarely drinks alcohol. He does smoke delta-9 THC and occasional uses edible marijuana. ? ?In early December, Mr. Dustin Parks had a complete left ACL and lateral meniscal tear. He underwent a left knee anterior cruciate ligament reconstruction with quadriceps autograft and left knee lateral meniscal repair on Jan 4. He has struggled with some ongoing knee pain and limited ROM. He is currently scheduled on Friday for arthroscopic lysis of adhesions and manipulation under anesthesia to try and resolve the issue. ? ?Mr. Dustin Parks notes he has a strong family history of obesity. He struggles a bit with his weight. He is considering a restricted calorie diet and plans to increase physical activity as his knee will allow. ? ?Mr. Dustin Parks has a history of swelling of his lips and tongue apparently secondary to exposure to certain citrus foods. The day this surfaced, he had been trying multiple citrus drinks as part of learning to make cocktails. He notes there was progressive swelling of various sides of his lips. He thinks he may be okay with limes, as he has had this since. He does have an Epi-Pen at home. ? ?Past Medical  History: ?Patient Active Problem List  ? Diagnosis Date Noted  ? Arthrofibrosis of knee joint, left 01/31/2022  ? S/P repair of anterior cruciate ligament 01/31/2022  ? Morbid obesity with BMI of 40.0-44.9, adult ([facility]) 01/31/2022  ? Hypertriglyceridemia 01/31/2022  ? Angioedema of lips- [place] 12/13/2021  ? Complex tear of lateral meniscus of left knee as current injury   ? History of orchitis 02/08/2021  ? ?Past Surgical History:  ?Procedure Laterality Date  ? KNEE ARTHROSCOPY WITH ANTERIOR CRUCIATE LIGAMENT (ACL) REPAIR WITH HAMSTRING GRAFT Left 11/08/2021  ? Procedure: LEFT KNEE ANTERIOR CRUCIATE LIGAMENT RECONSTRUCTION WITH QUADRICEPS TENDON AUTOGRAFT;  Surgeon: Huel CoteBokshan, Steven, MD;  Location: [facility];  Service: Orthopedics;  Laterality: Left;  ? KNEE ARTHROSCOPY WITH MENISCAL REPAIR  11/08/2021  ? Procedure: LEFT LATERAL MENISCAL REPAIR;  Surgeon: Huel CoteBokshan, Steven, MD;  Location: [facility];  Service: Orthopedics;;  ? [place] EXTRACTION  2022  ? ?Family History  ?Problem Relation Age of Onset  ? Obesity Mother   ? Hyperlipidemia Father   ? Obesity Father   ? Obesity Maternal Grandmother   ? Stroke Maternal Grandfather   ? Diabetes Paternal Grandmother   ? ?Outpatient Medications Prior to Visit  ?Medication Sig Dispense Refill  ? EPINEPHrine 0.3 mg/0.3 mL IJ SOAJ injection Inject 0.3 mg into the muscle as needed for anaphylaxis. 2 each 1  ? oxyCODONE (OXY IR/ROXICODONE) 5 MG immediate release tablet Take 1 tablet (5 mg total) by mouth every 4 (four)  hours as needed (severe pain). 20 tablet 0  ? aspirin EC 325 MG tablet Take 1 tablet (325 mg total) by mouth daily. 30 tablet 0  ? aspirin EC 325 MG tablet Take 1 tablet (325 mg total) by mouth daily. 30 tablet 0  ? predniSONE (DELTASONE) 10 MG tablet Take 6 tablets today, then 5 tablets tomorrow, then decrease by 1 tablet every day until gone 21 tablet 0  ? ?No facility-administered medications prior to visit.  ? ?Allergies   ?Allergen Reactions  ? Other Swelling  ?  All citrus fruit with the exception of lime  ?   ?Objective:  ? ?Today's Vitals  ? 01/31/22 1511  ?BP: 118/72  ?Pulse: 68  ?Temp: 97.6 ?F (36.4 ?C)  ?TempSrc: Temporal  ?SpO2: 98%  ?Weight: (!) 311 lb 9.6 oz (141.3 kg)  ?Height: 6\' 1"  (1.854 m)  ? ?Body mass index is 41.11 kg/m?.  ? ?General: Well developed, well nourished. No acute distress. ?HEENT: Normocephalic, non-traumatic. PERRL, EOMI. Conjunctiva clear. External ears normal. EAC and TMs normal  ? bilaterally. Nose clear without congestion or rhinorrhea. Mucous membranes moist. Oropharynx clear. Good dentition. ?Neck: Supple. No lymphadenopathy. No thyromegaly. ?Lungs: Clear to auscultation bilaterally. No wheezing, rales or rhonchi. ?CV: RRR without murmurs or rubs. Pulses 2+ bilaterally. ?Abdomen: Soft, non-tender. Bowel sounds positive, normal pitch and frequency. No hepatosplenomegaly. No rebound  ? or guarding. ?Extremities: Left knee remains mildly swollen. he does have limited flexion. ?Skin: Warm and dry. Small angioma below the right eye. ?Psych: Alert and oriented. Normal mood and affect. ? ?Health Maintenance Due  ?Topic Date Due  ? HPV VACCINES (1 - Male 2-dose series) Never done  ? ?Lab Results ?Last metabolic panel ?Lab Results  ?Component Value Date  ? GLUCOSE 105 (H) 08/18/2020  ? NA 137 08/18/2020  ? K 3.9 08/18/2020  ? CL 103 08/18/2020  ? CO2 25 08/18/2020  ? BUN 14 08/18/2020  ? CREATININE 0.75 08/18/2020  ? CALCIUM 10.1 08/18/2020  ? PROT 7.9 08/18/2020  ? ALBUMIN 4.6 08/18/2020  ? BILITOT 0.7 08/18/2020  ? ALKPHOS 42 (L) 08/18/2020  ? AST 28 08/18/2020  ? ALT 30 08/18/2020  ? ?Last lipids ?Lab Results  ?Component Value Date  ? CHOL 168 08/18/2020  ? HDL 44.30 08/18/2020  ? LDLDIRECT 104.0 08/18/2020  ? TRIG 242.0 (H) 08/18/2020  ? CHOLHDL 4 08/18/2020  ? ?Assessment & Plan:  ? ?1. Arthrofibrosis of knee joint, left ?2. S/P repair of anterior cruciate ligament ?Reviewed operative note and prior  MRI scan. Scheduled for surgery later this week. ? ?3. Angioedema of lips, subsequent encounter ?Possible trigger from citrus fruit. I recommend ongoing avoidance and keeping an Epi-pen available. ? ?4. Morbid obesity with BMI of 40.0-44.9, adult ([facility]) ?Discussed diet and exercise approaches to improving weight.We will engage in this more fully after knee has ahd a chance to improve. ? ?5. Hypertriglyceridemia ?Recommend we reassess this within 5 years. ? ? ?Return in about 1 year (around 02/01/2023) for Reassessment.  ? ?Haydee Salter, MD ?

## 2022-01-31 NOTE — Progress Notes (Signed)
Patient was provided with CHG cleanser to use at home before the procedure. Patient verbalized understanding of instructions.       Patient Instructions  The night before surgery:  No food after midnight. ONLY clear liquids after midnight  The day of surgery (if you do NOT have diabetes):  Drink ONE (1) Pre-Surgery Clear Ensure as directed.   This drink was given to you during your hospital  pre-op appointment visit. The pre-op nurse will instruct you on the time to drink the  Pre-Surgery Ensure depending on your surgery time. Finish the drink at the designated time by the pre-op nurse.  Nothing else to drink after completing the  Pre-Surgery Clear Ensure.  The day of surgery (if you have diabetes): Drink ONE (1) Gatorade 2 (G2) as directed. This drink was given to you during your hospital  pre-op appointment visit.  The pre-op nurse will instruct you on the time to drink the   Gatorade 2 (G2) depending on your surgery time. Color of the Gatorade may vary. Red is not allowed. Nothing else to drink after completing the  Gatorade 2 (G2).         If you have questions, please contact your surgeon's office.  

## 2022-01-31 NOTE — Therapy (Signed)
?  OUTPATIENT PHYSICAL THERAPY TREATMENT NOTE ? ? ?Patient Name: Dustin Parks ?MRN: [id number] ?DOB:Mar 31, 2001, 21 y.o., male ?Today's Date: 01/31/2022 ? ?PCP: Pcp, No ?REFERRING PROVIDER: [name] ? ? ? ?Past Medical History:  ?Diagnosis Date  ? Asthma   ? out grown now   ? ?Past Surgical History:  ?Procedure Laterality Date  ? KNEE ARTHROSCOPY WITH ANTERIOR CRUCIATE LIGAMENT (ACL) REPAIR WITH HAMSTRING GRAFT Left 11/08/2021  ? Procedure: LEFT KNEE ANTERIOR CRUCIATE LIGAMENT RECONSTRUCTION WITH QUADRICEPS TENDON AUTOGRAFT;  Surgeon: [name], MD;  Location: [facility];  Service: Orthopedics;  Laterality: Left;  ? KNEE ARTHROSCOPY WITH MENISCAL REPAIR  11/08/2021  ? Procedure: LEFT LATERAL MENISCAL REPAIR;  Surgeon: [name], MD;  Location: [facility];  Service: Orthopedics;;  ? [place] EXTRACTION  2022  ? ?Patient Active Problem List  ? Diagnosis Date Noted  ? Allergic reaction 12/13/2021  ? Left anterior cruciate ligament tear   ? Complex tear of lateral meniscus of left knee as current injury   ? Somatic dysfunction of spine, cervical 10/05/2021  ? Adjustment disorder 09/26/2021  ? Neck pain 09/26/2021  ? History of orchitis 02/08/2021  ? ? ?REFERRING DIAG: [id number] (ICD-10-CM) - Rupture of anterior cruciate ligament of left knee, initial encounter  ?     [id number] (ICD-10-CM) - Complex tear of lateral meniscus of left knee as current injury, initial encounter      Z98.890 (ICD-10-CM) - S/P left knee arthroscopy  ? ?THERAPY DIAG:  ?Left knee pain, unspecified chronicity ?  ?Stiffness of left knee, not elsewhere classified ?  ?Muscle weakness (generalized) ?  ?Difficulty in walking, not elsewhere classified ?  ? ?PERTINENT HISTORY: 11/08/2021 Left knee ACL reconstruction with quadriceps autograft and lateral meniscal repair.  Allergy to certain adhesives including tegaderm per pt report  ? ?PRECAUTIONS:  per Dr. Eddie Dibbles ACL reconstruction with meniscal  repair ? ?SUBJECTIVE: Pt denies any adverse effects after prior Rx.  Pt feels that his knee ROM is not improving.  He is scheduled for manipulation on Friday. ? ?PT withheld Rx today due to pt having a manipulation on Friday and not making current progress with ROM.  Pt performed the recumbent bike performing 1/2 revolutions today, but did not perform any other exercises due to PT withholding Rx.   ? ? ? ?Selinda Michaels III PT, DPT ?01/31/22 1:45 PM ? ? ?  ? ?

## 2022-02-02 ENCOUNTER — Other Ambulatory Visit: Payer: Self-pay

## 2022-02-02 ENCOUNTER — Encounter (HOSPITAL_BASED_OUTPATIENT_CLINIC_OR_DEPARTMENT_OTHER)
Admission: RE | Disposition: A | Discharge status: Home / Self Care | Payer: Self-pay | Source: Home / Self Care | Attending: Orthopaedic Surgery

## 2022-02-02 ENCOUNTER — Ambulatory Visit (HOSPITAL_BASED_OUTPATIENT_CLINIC_OR_DEPARTMENT_OTHER): Payer: No Typology Code available for payment source | Admitting: Certified Registered"

## 2022-02-02 ENCOUNTER — Ambulatory Visit (HOSPITAL_COMMUNITY)
Admission: RE | Admit: 2022-02-02 | Discharge: 2022-02-02 | Disposition: A | Discharge status: Home / Self Care | Payer: No Typology Code available for payment source | Attending: Orthopaedic Surgery | Admitting: Orthopaedic Surgery

## 2022-02-02 ENCOUNTER — Encounter (HOSPITAL_BASED_OUTPATIENT_CLINIC_OR_DEPARTMENT_OTHER): Payer: Self-pay | Admitting: Physical Therapy

## 2022-02-02 ENCOUNTER — Encounter (HOSPITAL_BASED_OUTPATIENT_CLINIC_OR_DEPARTMENT_OTHER): Payer: Self-pay | Admitting: Orthopaedic Surgery

## 2022-02-02 DIAGNOSIS — S83262A Peripheral tear of lateral meniscus, current injury, left knee, initial encounter: Secondary | ICD-10-CM | POA: Diagnosis not present

## 2022-02-02 DIAGNOSIS — T8482XA Fibrosis due to internal orthopedic prosthetic devices, implants and grafts, initial encounter: Secondary | ICD-10-CM

## 2022-02-02 DIAGNOSIS — Z6841 Body Mass Index (BMI) 40.0 and over, adult: Secondary | ICD-10-CM | POA: Diagnosis not present

## 2022-02-02 DIAGNOSIS — X58XXXA Exposure to other specified factors, initial encounter: Secondary | ICD-10-CM | POA: Insufficient documentation

## 2022-02-02 DIAGNOSIS — J45909 Unspecified asthma, uncomplicated: Secondary | ICD-10-CM | POA: Diagnosis not present

## 2022-02-02 DIAGNOSIS — M24662 Ankylosis, left knee: Secondary | ICD-10-CM | POA: Diagnosis present

## 2022-02-02 DIAGNOSIS — S83282A Other tear of lateral meniscus, current injury, left knee, initial encounter: Secondary | ICD-10-CM | POA: Diagnosis not present

## 2022-02-02 HISTORY — PX: KNEE ARTHROSCOPY: SHX127

## 2022-02-02 SURGERY — ARTHROSCOPY, KNEE
Anesthesia: General | Site: Knee | Laterality: Left

## 2022-02-02 MED ORDER — ACETAMINOPHEN 500 MG PO TABS
ORAL_TABLET | ORAL | Status: AC
  Filled 2022-02-02: qty 2

## 2022-02-02 MED ORDER — CEFAZOLIN SODIUM-DEXTROSE 2-3 GM-%(50ML) IV SOLR
INTRAVENOUS | Status: DC | PRN
  Administered 2022-02-02: 3 g via INTRAVENOUS

## 2022-02-02 MED ORDER — FENTANYL CITRATE (PF) 100 MCG/2ML IJ SOLN
INTRAMUSCULAR | Status: AC
  Filled 2022-02-02: qty 2

## 2022-02-02 MED ORDER — LIDOCAINE HCL (CARDIAC) PF 100 MG/5ML IV SOSY
PREFILLED_SYRINGE | INTRAVENOUS | Status: DC | PRN
  Administered 2022-02-02: 100 mg via INTRAVENOUS

## 2022-02-02 MED ORDER — CEFAZOLIN IN SODIUM CHLORIDE 3-0.9 GM/100ML-% IV SOLN
INTRAVENOUS | Status: AC
  Filled 2022-02-02: qty 100

## 2022-02-02 MED ORDER — LACTATED RINGERS IV SOLN: INTRAVENOUS | Status: DC | PRN

## 2022-02-02 MED ORDER — HYDROMORPHONE HCL 1 MG/ML IJ SOLN
INTRAMUSCULAR | Status: DC | PRN
  Administered 2022-02-02: .5 mg via INTRAVENOUS

## 2022-02-02 MED ORDER — LACTATED RINGERS IV SOLN: INTRAVENOUS | Status: DC

## 2022-02-02 MED ORDER — FENTANYL CITRATE (PF) 100 MCG/2ML IJ SOLN
INTRAMUSCULAR | Status: DC | PRN
  Administered 2022-02-02: 100 ug via INTRAVENOUS
  Administered 2022-02-02 (×4): 50 ug via INTRAVENOUS

## 2022-02-02 MED ORDER — BUPIVACAINE HCL (PF) 0.25 % IJ SOLN
INTRAMUSCULAR | Status: DC | PRN
  Administered 2022-02-02: 20 mL

## 2022-02-02 MED ORDER — FENTANYL CITRATE (PF) 100 MCG/2ML IJ SOLN: 25.0000 ug | INTRAMUSCULAR | Status: DC | PRN

## 2022-02-02 MED ORDER — SODIUM CHLORIDE 0.9 % IR SOLN
Status: DC | PRN
  Administered 2022-02-02: 6000 mL

## 2022-02-02 MED ORDER — CEFAZOLIN IN SODIUM CHLORIDE 3-0.9 GM/100ML-% IV SOLN: 3.0000 g | INTRAVENOUS | Status: DC

## 2022-02-02 MED ORDER — LIDOCAINE 2% (20 MG/ML) 5 ML SYRINGE
INTRAMUSCULAR | Status: AC
  Filled 2022-02-02: qty 5

## 2022-02-02 MED ORDER — DEXAMETHASONE SODIUM PHOSPHATE 10 MG/ML IJ SOLN
INTRAMUSCULAR | Status: DC | PRN
  Administered 2022-02-02: 5 mg via INTRAVENOUS

## 2022-02-02 MED ORDER — PROPOFOL 10 MG/ML IV BOLUS
INTRAVENOUS | Status: AC
Start: 2022-02-02 — End: ?
  Filled 2022-02-02: qty 20

## 2022-02-02 MED ORDER — MIDAZOLAM HCL 2 MG/2ML IJ SOLN
2.0000 mg | Freq: Once | INTRAMUSCULAR | Status: AC
Start: 2022-02-02 — End: 2022-02-02
  Administered 2022-02-02: 2 mg via INTRAVENOUS

## 2022-02-02 MED ORDER — TRANEXAMIC ACID-NACL 1000-0.7 MG/100ML-% IV SOLN
1000.0000 mg | INTRAVENOUS | Status: AC
  Administered 2022-02-02: 1000 mg via INTRAVENOUS

## 2022-02-02 MED ORDER — PROPOFOL 10 MG/ML IV BOLUS
INTRAVENOUS | Status: AC
  Filled 2022-02-02: qty 20

## 2022-02-02 MED ORDER — ACETAMINOPHEN 500 MG PO TABS
1000.0000 mg | ORAL_TABLET | Freq: Once | ORAL | Status: AC
  Administered 2022-02-02: 1000 mg via ORAL

## 2022-02-02 MED ORDER — MIDAZOLAM HCL 2 MG/2ML IJ SOLN
INTRAMUSCULAR | Status: AC
Start: 2022-02-02 — End: ?
  Filled 2022-02-02: qty 2

## 2022-02-02 MED ORDER — DEXMEDETOMIDINE HCL IN NACL 400 MCG/100ML IV SOLN
INTRAVENOUS | Status: DC | PRN
Start: 2022-02-02 — End: 2022-02-02
  Administered 2022-02-02: 4 ug via INTRAVENOUS
  Administered 2022-02-02 (×2): 8 ug via INTRAVENOUS

## 2022-02-02 MED ORDER — HYDROMORPHONE HCL 1 MG/ML IJ SOLN
INTRAMUSCULAR | Status: AC
  Filled 2022-02-02: qty 1

## 2022-02-02 MED ORDER — GABAPENTIN 300 MG PO CAPS
ORAL_CAPSULE | ORAL | Status: AC
  Filled 2022-02-02: qty 1

## 2022-02-02 MED ORDER — OXYCODONE HCL 5 MG/5ML PO SOLN: 5.0000 mg | Freq: Once | ORAL | Status: DC | PRN

## 2022-02-02 MED ORDER — ONDANSETRON HCL 4 MG/2ML IJ SOLN
INTRAMUSCULAR | Status: DC | PRN
Start: 2022-02-02 — End: 2022-02-02
  Administered 2022-02-02: 4 mg via INTRAVENOUS

## 2022-02-02 MED ORDER — KETOROLAC TROMETHAMINE 30 MG/ML IJ SOLN: 30.0000 mg | Freq: Once | INTRAMUSCULAR | Status: DC | PRN

## 2022-02-02 MED ORDER — ONDANSETRON HCL 4 MG/2ML IJ SOLN
INTRAMUSCULAR | Status: AC
  Filled 2022-02-02: qty 2

## 2022-02-02 MED ORDER — TRANEXAMIC ACID-NACL 1000-0.7 MG/100ML-% IV SOLN
INTRAVENOUS | Status: AC
  Filled 2022-02-02: qty 100

## 2022-02-02 MED ORDER — MIDAZOLAM HCL 2 MG/2ML IJ SOLN
INTRAMUSCULAR | Status: AC
  Filled 2022-02-02: qty 2

## 2022-02-02 MED ORDER — DEXAMETHASONE SODIUM PHOSPHATE 10 MG/ML IJ SOLN
INTRAMUSCULAR | Status: AC
  Filled 2022-02-02: qty 1

## 2022-02-02 MED ORDER — BUPIVACAINE HCL (PF) 0.25 % IJ SOLN
INTRAMUSCULAR | Status: AC
  Filled 2022-02-02: qty 30

## 2022-02-02 MED ORDER — GABAPENTIN 300 MG PO CAPS
300.0000 mg | ORAL_CAPSULE | Freq: Once | ORAL | Status: AC
  Administered 2022-02-02: 300 mg via ORAL

## 2022-02-02 MED ORDER — BUPIVACAINE-EPINEPHRINE (PF) 0.5% -1:200000 IJ SOLN
INTRAMUSCULAR | Status: DC | PRN
  Administered 2022-02-02: 30 mL via PERINEURAL

## 2022-02-02 MED ORDER — PROPOFOL 10 MG/ML IV BOLUS
INTRAVENOUS | Status: DC | PRN
  Administered 2022-02-02: 200 mg via INTRAVENOUS

## 2022-02-02 MED ORDER — OXYCODONE HCL 5 MG PO TABS: 5.0000 mg | ORAL_TABLET | Freq: Once | ORAL | Status: DC | PRN

## 2022-02-02 MED ORDER — AMISULPRIDE (ANTIEMETIC) 5 MG/2ML IV SOLN: 10.0000 mg | Freq: Once | INTRAVENOUS | Status: DC | PRN

## 2022-02-02 SURGICAL SUPPLY — 49 items
APL PRP STRL LF DISP 70% ISPRP (MISCELLANEOUS) ×1
BANDAGE ESMARK 6X9 LF (GAUZE/BANDAGES/DRESSINGS) IMPLANT
BLADE EXCALIBUR 4.0X13 (MISCELLANEOUS) IMPLANT
BNDG CMPR 9X6 STRL LF SNTH (GAUZE/BANDAGES/DRESSINGS)
BNDG ELASTIC 4X5.8 VLCR STR LF (GAUZE/BANDAGES/DRESSINGS) IMPLANT
BNDG ELASTIC 6X5.8 VLCR STR LF (GAUZE/BANDAGES/DRESSINGS) ×3 IMPLANT
BNDG ESMARK 6X9 LF (GAUZE/BANDAGES/DRESSINGS)
CHLORAPREP W/TINT 26 (MISCELLANEOUS) ×3 IMPLANT
COOLER ICEMAN CLASSIC (MISCELLANEOUS) IMPLANT
CUFF TOURN SGL QUICK 34 (TOURNIQUET CUFF) ×2
CUFF TRNQT CYL 34X4.125X (TOURNIQUET CUFF) ×2 IMPLANT
DISSECTOR  3.8MM X 13CM (MISCELLANEOUS) ×2
DISSECTOR 3.8MM X 13CM (MISCELLANEOUS) ×2 IMPLANT
DRAPE ARTHROSCOPY W/POUCH 90 (DRAPES) ×3 IMPLANT
DRAPE IMP U-DRAPE 54X76 (DRAPES) ×3 IMPLANT
DRAPE INCISE IOBAN 66X45 STRL (DRAPES) ×1 IMPLANT
DRAPE U-SHAPE 47X51 STRL (DRAPES) ×1 IMPLANT
DRSG PAD ABDOMINAL 8X10 ST (GAUZE/BANDAGES/DRESSINGS) IMPLANT
DW OUTFLOW CASSETTE/TUBE SET (MISCELLANEOUS) ×1 IMPLANT
ELECT REM PT RETURN 9FT ADLT (ELECTROSURGICAL)
ELECTRODE REM PT RTRN 9FT ADLT (ELECTROSURGICAL) IMPLANT
EXCALIBUR 3.8MM X 13CM (MISCELLANEOUS) IMPLANT
GAUZE 4X4 16PLY ~~LOC~~+RFID DBL (SPONGE) IMPLANT
GAUZE SPONGE 4X4 12PLY STRL (GAUZE/BANDAGES/DRESSINGS) ×3 IMPLANT
GAUZE XEROFORM 1X8 LF (GAUZE/BANDAGES/DRESSINGS) ×3 IMPLANT
GLOVE SRG 8 PF TXTR STRL LF DI (GLOVE) ×2 IMPLANT
GLOVE SURG ENC MOIS LTX SZ6 (GLOVE) ×3 IMPLANT
GLOVE SURG ENC MOIS LTX SZ7.5 (GLOVE) ×3 IMPLANT
GLOVE SURG UNDER POLY LF SZ6.5 (GLOVE) ×3 IMPLANT
GLOVE SURG UNDER POLY LF SZ8 (GLOVE) ×2
GOWN STRL REUS W/ TWL LRG LVL3 (GOWN DISPOSABLE) ×2 IMPLANT
GOWN STRL REUS W/ TWL XL LVL3 (GOWN DISPOSABLE) ×2 IMPLANT
GOWN STRL REUS W/TWL LRG LVL3 (GOWN DISPOSABLE) ×2
GOWN STRL REUS W/TWL XL LVL3 (GOWN DISPOSABLE) ×2
MANIFOLD NEPTUNE II (INSTRUMENTS) ×3 IMPLANT
NDL HYPO 18GX1.5 BLUNT FILL (NEEDLE) ×2 IMPLANT
NDL SAFETY ECLIPSE 18X1.5 (NEEDLE) ×2 IMPLANT
NEEDLE HYPO 18GX1.5 BLUNT FILL (NEEDLE) ×2 IMPLANT
NEEDLE HYPO 18GX1.5 SHARP (NEEDLE) ×2
PACK ARTHROSCOPY DSU (CUSTOM PROCEDURE TRAY) ×3 IMPLANT
PACK BASIN DAY SURGERY FS (CUSTOM PROCEDURE TRAY) ×3 IMPLANT
PAD COLD SHLDR WRAP-ON (PAD) ×3 IMPLANT
PADDING CAST COTTON 6X4 STRL (CAST SUPPLIES) ×3 IMPLANT
PORT APPOLLO RF 90DEGREE MULTI (SURGICAL WAND) ×3 IMPLANT
SLEEVE SCD COMPRESS KNEE MED (STOCKING) ×2 IMPLANT
SUT ETHILON 3 0 PS 1 (SUTURE) ×3 IMPLANT
SYR 5ML LL (SYRINGE) ×3 IMPLANT
TOWEL GREEN STERILE FF (TOWEL DISPOSABLE) ×6 IMPLANT
TUBING ARTHROSCOPY IRRIG 16FT (MISCELLANEOUS) ×3 IMPLANT

## 2022-02-02 NOTE — Interval H&P Note (Signed)
History and Physical Interval Note: ? ?02/02/2022 ?11:22 AM ? ?Dustin Parks  has presented today for surgery, with the diagnosis of LEFT KNEE ARTHROFIBROSIS.  The various methods of treatment have been discussed with the patient and family. After consideration of risks, benefits and other options for treatment, the patient has consented to  Procedure(s): ?LEFT ARTHROSCOPY KNEE LYSIS OF ADHESION AND MANIPULATION UNDER ANESTHESIA (Left) as a surgical intervention.  The patient's history has been reviewed, patient examined, no change in status, stable for surgery.  I have reviewed the patient's chart and labs.  Questions were answered to the patient's satisfaction.   ? ? ?[name] ? ? ?

## 2022-02-02 NOTE — Anesthesia Preprocedure Evaluation (Addendum)
Anesthesia Evaluation  ?Patient identified by MRN, date of birth, ID band ?Patient awake ? ? ? ?Reviewed: ?Allergy & Precautions, NPO status , Patient's Chart, lab work & pertinent test results ? ?Airway ?Mallampati: III ? ?TM Distance: >3 FB ?Neck ROM: Full ? ? ? Dental ?no notable dental hx. ? ?  ?Pulmonary ?asthma ,  ?  ?Pulmonary exam normal ?breath sounds clear to auscultation ? ? ? ? ? ? Cardiovascular ?negative cardio ROS ?Normal cardiovascular exam ?Rhythm:Regular Rate:Normal ? ? ?  ?Neuro/Psych ?negative neurological ROS ? negative psych ROS  ? GI/Hepatic ?negative GI ROS, Neg liver ROS,   ?Endo/Other  ?Morbid obesity ? Renal/GU ?negative Renal ROS  ? ?  ?Musculoskeletal ? ? Abdominal ?  ?Peds ? Hematology ?negative hematology ROS ?(+)   ?Anesthesia Other Findings ?LEFT KNEE ARTHROFIBROSIS ? Reproductive/Obstetrics ? ?  ? ? ? ? ? ? ? ? ? ? ? ? ? ?  ?  ? ? ? ? ? ? ? ?Anesthesia Physical ?Anesthesia Plan ? ?ASA: 3 ? ?Anesthesia Plan: General  ? ?Post-op Pain Management:   ? ?Induction: Intravenous ? ?PONV Risk Score and Plan: 2 and Ondansetron, Dexamethasone, Midazolam and Treatment may vary due to age or medical condition ? ?Airway Management Planned: LMA ? ?Additional Equipment:  ? ?Intra-op Plan:  ? ?Post-operative Plan: Extubation in OR ? ?Informed Consent: I have reviewed the patients History and Physical, chart, labs and discussed the procedure including the risks, benefits and alternatives for the proposed anesthesia with the patient or authorized representative who has indicated his/her understanding and acceptance.  ? ? ? ?Dental advisory given ? ?Plan Discussed with: CRNA ? ?Anesthesia Plan Comments:   ? ? ? ? ? ?Anesthesia Quick Evaluation ? ?

## 2022-02-02 NOTE — Discharge Instructions (Addendum)
? ? ? Discharge Instructions  ? ? ?Attending Surgeon: Huel Cote, MD ?Office Phone Number: [phone] ? ? ?Diagnosis and Procedures:   ? ?Surgeries Performed: ?Left knee manipulation with lysis of adhesions ? ?Discharge Plan:  ? ? ?Diet: ?Resume usual diet. Begin with light or bland foods.  Drink plenty of fluids. ? ?Activity:  ?Weight bearing and activity as tolerated left leg. Please keep your brace locked until follow-up. ?You are advised to go home directly from the hospital or surgical center. Restrict your activities. ? ?GENERAL INSTRUCTIONS: ?1.  Keep your surgical site elevated above your heart for at least 5-7 days or longer to prevent swelling. This will improve your comfort and your overall recovery following surgery.   ?  ?2. Please call Dr. Serena Croissant office at [phone] with questions Monday-Friday during business hours. If no one answers, please leave a message and someone should get back to the patient within 24 hours. For emergencies please call 911 or proceed to the emergency room.  ? ?3. Patient to notify surgical team if experiences any of the following: Bowel/Bladder dysfunction, uncontrolled pain, nerve/muscle weakness, incision with increased drainage or redness, nausea/vomiting and Fever greater than 101.0 F.  Be alert for signs of infection including redness, streaking, odor, fever or chills. Be alert for excessive pain or bleeding and notify your surgeon immediately. ? ?WOUND INSTRUCTIONS:   ?Leave your dressing/cast/splint in place until your post operative visit.  Keep it clean and dry. ? ?Always keep the incision clean and dry until the staples/sutures are removed. If there is no drainage from the incision you should keep it open to air. If there is drainage from the incision you must keep it covered at all times until the drainage stops ? Do not soak in a bath tub, hot tub, pool, lake or other body of water until 21 days after your surgery and your incision is completely dry  and healed.  ?If you have removable sutures (or staples) they must be removed 10-14 days (unless otherwise instructed) from the day of your surgery.  ? ? ? 1)  Elevate the extremity as much as possible. ? 2)  Keep the dressing clean and dry. ? 3)  Please call us if the dressing becomes wet or dirty. ? 4)  If you are experiencing worsening pain or worsening swelling, please call. ?  ?  ?MEDICATIONS: ?Resume all previous home medications at the previous prescribed dose and frequency unless otherwise noted ?Start taking the  pain medications on an as-needed basis as prescribed  ?Please taper down pain medication over the next week following surgery.  Ideally you should not require a refill of any narcotic pain medication.  ?Take pain medication with food to minimize nausea. ?In addition to the prescribed pain medication, you may take over-the-counter pain relievers such as Tylenol.  Do NOT take additional tylenol if your pain medication already has tylenol in it.  ?Aspirin 325mg  daily for four weeks. ? ?  ?  ?FOLLOWUP INSTRUCTIONS: ?1. Follow up at the Physical Therapy Clinic 3-4 days following surgery. This appointment should be scheduled unless other arrangements have been made.The Physical Therapy scheduling number is [phone] if an appointment has not already been arranged. ? ?2. Contact Dr. [phone] office during office hours at [phone] or the practice after hours line at [phone] for non-emergencies. For medical emergencies call 911. ? ? ?Discharge Location: Home  ? ?Post Anesthesia Home Care Instructions ? ?Activity: ?Get plenty of rest for the remainder of  the day. A responsible individual must stay with you for 24 hours following the procedure.  ?For the next 24 hours, DO NOT: ?-Drive a car ?-Advertising copywriter ?-Drink alcoholic beverages ?-Take any medication unless instructed by your physician ?-Make any legal decisions or sign important papers. ? ?Meals: ?Start with liquid foods such as  gelatin or soup. Progress to regular foods as tolerated. Avoid greasy, spicy, heavy foods. If nausea and/or vomiting occur, drink only clear liquids until the nausea and/or vomiting subsides. Call your physician if vomiting continues. ? ?Special Instructions/Symptoms: ?Your throat may feel dry or sore from the anesthesia or the breathing tube placed in your throat during surgery. If this causes discomfort, gargle with warm salt water. The discomfort should disappear within 24 hours. ? ?If you had a scopolamine patch placed behind your ear for the management of post- operative nausea and/or vomiting: ? ?1. The medication in the patch is effective for 72 hours, after which it should be removed.  Wrap patch in a tissue and discard in the trash. Wash hands thoroughly with soap and water. ?2. You may remove the patch earlier than 72 hours if you experience unpleasant side effects which may include dry mouth, dizziness or visual disturbances. ?3. Avoid touching the patch. Wash your hands with soap and water after contact with the patch. ? ?Regional Anesthesia Blocks ? ?1. Numbness or the inability to move the "blocked" extremity may last from 3-48 hours after placement. The length of time depends on the medication injected and your individual response to the medication. If the numbness is not going away after 48 hours, call your surgeon. ? ?2. The extremity that is blocked will need to be protected until the numbness is gone and the  Strength has returned. Because you cannot feel it, you will need to take extra care to avoid injury. Because it may be weak, you may have difficulty moving it or using it. You may not know what position it is in without looking at it while the block is in effect. ? ?3. For blocks in the legs and feet, returning to weight bearing and walking needs to be done carefully. You will need to wait until the numbness is entirely gone and the strength has returned. You should be able to move your leg  and foot normally before you try and bear weight or walk. You will need someone to be with you when you first try to ensure you do not fall and possibly risk injury. ? ?4. Bruising and tenderness at the needle site are common side effects and will resolve in a few days. ? ?5. Persistent numbness or new problems with movement should be communicated to the surgeon or the [facility] [phone] [facility] [phone]).  ? ?No tylenol until after 5pm if needed today. ?    ?

## 2022-02-02 NOTE — Op Note (Signed)
? ?Date of Surgery: 02/02/2022 ? ?INDICATIONS: Mr. Hengel is a 21 y.o.-year-old male with left knee arthrofibrosis following ACL reconstruction as well as lateral meniscal repair.  The risk and benefits of the procedure with discussed in detail and documented in the pre-operative evaluation. ? ?PREOPERATIVE DIAGNOSIS: 1.  Left knee arthrofibrosis ? ?POSTOPERATIVE DIAGNOSIS: Same plus lateral meniscal tear ? ?PROCEDURE: 1.  Left knee extensive debridement and synovectomy ?2.  Left knee manipulation under anesthesia ?3.  Lateral meniscal debridement ? ?SURGEON: Benancio Deeds MD ? ?ASSISTANT: Lilia Argue ? ?ANESTHESIA:  general plus peripheral nerve block ? ?IV FLUIDS AND URINE: See anesthesia record. ? ?ANTIBIOTICS: Ancef 2 g ? ?ESTIMATED BLOOD LOSS: 10 mL. ? ?IMPLANTS:  ?* No implants in log * ? ?DRAINS: None ? ?CULTURES: None ? ?COMPLICATIONS: none ? ?DESCRIPTION OF PROCEDURE:  ?Examination under anesthesia: A careful examination under anesthesia was performed.  Knee ROM motion was: 0-80 prior to procedure, 0-120 following manipulation ?Lachman: Normal ?Pivot Shift: Normal ?Posterior drawer: normal.   ?Varus stability in full extension: normal.   ?Varus stability in 30 degrees of flexion: normal.  ?Valgus stability in full extension: normal.   ?Valgus stability in 30 degrees of flexion: normal.  ?Posterolateral drawer: normal ?  ?Intra-operative findings: A thorough arthroscopic examination of the knee was performed.  The findings are: ?1. Suprapatellar pouch: Extensive arthrofibrosis and scarring ?2. Undersurface of median ridge: Normal ?3. Medial patellar facet: Normal ?4. Lateral patellar facet: Normal ?5. Trochlea: Normal ?6. Lateral gutter/popliteus tendon: Extensive arthrofibrosis and scarring ?7. Hoffa's fat pad: Normal ?8. Medial gutter/plica: Extensive arthrofibrosis and scarring ?9. ACL: Intact with ligamentization ?10. PCL: Normal ?11. Medial meniscus: Normal ?12. Medial compartment cartilage:  Normal ?13. Lateral meniscus: Healed with some exposed suture from previous repair.  There was some white white fraying involving the posterior body but intact root. ?14. Lateral compartment cartilage: Normal ? ?Patient was identified in the preoperative holding area.  Correct site was marked according universal protocol with nursing.  Anesthesia performed a peripheral nerve block.  He is subsequently taken back to the operating room.  He was transferred over the operating room table.  He was prepped and draped in usual sterile fashion.  Final timeout was again performed. ? ?Began with a standard diagnostic arthroscopy with the above findings.  Began with an 11 blade to establish the standard anterior lateral portal.  The scope trocar was introduced into the knee.  There is significant arthrofibrosis involving the suprapatellar pouch as well as the medial lateral gutters.  His ACL was intact and had undergone ligamentization.  In terms of the lateral meniscus, this was healed particularly back at the root repair type site.  There was some exposed suture.  As result the shaver was brought into the lateral joint and this access suture was debrided.  There was some white white fraying meniscus laterally which was debrided.  This involved the posterior body.  This was shaved back to healthy appearing tissue. ? ?At this time the radiofrequency wand was introduced into the knee.  Thorough debridement was performed in the suprapatellar pouch as well as medial lateral gutters.  Extensive synovectomy was performed involving the suprapatellar pouch medial lateral gutters as well as the Hoffa's fat pad space utilizing the radiofrequency wand.  At this time fluid was evacuated from the knee.  The knee portals were closed with 3-0 nylon.  At this time manipulation was performed with a palpable breaking up of scar tissue.  The knee has a  significantly improved range of motion.  20 cc of 0.25% Marcaine was introduced into the knee  portals and intra-articularly.  A soft dressing with gauze web roll and Ace was applied. ? ? ? ? ? ?POSTOPERATIVE PLAN: He will be weightbearing and activity as tolerated on the left leg.  He will begin physical therapy postop day 1 for aggressive range of motion.  I will see him back in 2 weeks for suture removal. ? ?Benancio Deeds, MD ?1:58 PM ? ? ? ?

## 2022-02-02 NOTE — Anesthesia Procedure Notes (Signed)
Anesthesia Regional Block: Adductor canal block  ? ?Pre-Anesthetic Checklist: , timeout performed,  Correct Patient, Correct Site, Correct Laterality,  Correct Procedure,, site marked,  Risks and benefits discussed,  Surgical consent,  Pre-op evaluation,  At surgeon's request and post-op pain management ? ?Laterality: Left ? ?Prep: chloraprep     ?  ?Needles:  ?Injection technique: Single-shot ? ?Needle Type: Echogenic Stimulator Needle   ? ? ?Needle Length: 10cm  ?Needle Gauge: 20  ? ? ? ?Additional Needles: ? ? ?Procedures:,,,, ultrasound used (permanent image in chart),,    ?Narrative:  ?Start time: 02/02/2022 11:20 AM ?End time: 02/02/2022 11:30 AM ?Injection made incrementally with aspirations every 5 mL. ? ?Performed by: Personally  ?Anesthesiologist: Leonides Grills, MD ? ?Additional Notes: ?Functioning IV was confirmed and monitors were applied. A time-out was performed. Hand hygiene and sterile gloves were used. The thigh was placed in a frog-leg position and prepped in a sterile fashion. A 20ga Bbraun echogenic stimulator needle was placed using ultrasound guidance.  Negative aspiration and negative test dose prior to incremental administration of local anesthetic. The patient tolerated the procedure well. ? ? ? ? ? ?

## 2022-02-02 NOTE — Transfer of Care (Signed)
Immediate Anesthesia Transfer of Care Note ? ?Patient: Dustin Parks ? ?Procedure(s) Performed: LEFT ARTHROSCOPY KNEE LYSIS OF ADHESION AND MANIPULATION UNDER ANESTHESIA (Left: Knee) ? ?Patient Location: PACU ? ?Anesthesia Type:General and Regional ? ?Level of Consciousness: awake, drowsy and patient cooperative ? ?Airway & Oxygen Therapy: Patient Spontanous Breathing and Patient connected to face mask oxygen ? ?Post-op Assessment: Report given to RN and Post -op Vital signs reviewed and stable ? ?Post vital signs: Reviewed and stable ? ?Last Vitals:  ?Vitals Value Taken Time  ?BP    ?Temp    ?Pulse 67 02/02/22 1315  ?Resp 15 02/02/22 1315  ?SpO2 98 % 02/02/22 1315  ?Vitals shown include unvalidated device data. ? ?Last Pain:  ?Vitals:  ? 02/02/22 1044  ?TempSrc: Oral  ?PainSc: 0-No pain  ?   ? ?  ? ?Complications: No notable events documented. ?

## 2022-02-02 NOTE — Anesthesia Postprocedure Evaluation (Signed)
Anesthesia Post Note ? ?Patient: Minter Bargas Hillegass ? ?Procedure(s) Performed: LEFT ARTHROSCOPY KNEE LYSIS OF ADHESION AND MANIPULATION UNDER ANESTHESIA (Left: Knee) ? ?  ? ?Patient location during evaluation: PACU ?Anesthesia Type: General and Regional ?Level of consciousness: awake ?Pain management: pain level controlled ?Vital Signs Assessment: post-procedure vital signs reviewed and stable ?Respiratory status: spontaneous breathing, nonlabored ventilation, respiratory function stable and patient connected to nasal cannula oxygen ?Cardiovascular status: blood pressure returned to baseline and stable ?Postop Assessment: no apparent nausea or vomiting ?Anesthetic complications: no ? ? ?No notable events documented. ? ?Last Vitals:  ?Vitals:  ? 02/02/22 1345 02/02/22 1400  ?BP: 131/74 131/69  ?Pulse: 69 62  ?Resp: 17 16  ?Temp:  36.8 ?C  ?SpO2: 97% 98%  ?  ?Last Pain:  ?Vitals:  ? 02/02/22 1044  ?TempSrc: Oral  ?PainSc: 0-No pain  ? ? ?  ?  ?  ?  ?  ?  ? ?Corianne Buccellato P Abbegayle Denault ? ? ? ? ?

## 2022-02-02 NOTE — Anesthesia Procedure Notes (Signed)
Procedure Name: LMA Insertion ?Date/Time: 02/02/2022 12:10 PM ?Performed by: Karen Kitchens, CRNA ?Pre-anesthesia Checklist: Patient identified, Emergency Drugs available, Suction available and Patient being monitored ?Patient Re-evaluated:Patient Re-evaluated prior to induction ?Oxygen Delivery Method: Circle system utilized ?Preoxygenation: Pre-oxygenation with 100% oxygen ?Induction Type: IV induction ?Ventilation: Mask ventilation without difficulty ?LMA: LMA inserted ?LMA Size: 5.0 ?Number of attempts: 1 ?Airway Equipment and Method: Bite block ?Placement Confirmation: positive ETCO2, CO2 detector and breath sounds checked- equal and bilateral ?Tube secured with: Tape ?Dental Injury: Teeth and Oropharynx as per pre-operative assessment  ? ? ? ? ?

## 2022-02-02 NOTE — Therapy (Signed)
?OUTPATIENT PHYSICAL THERAPY TREATMENT NOTE / RE-EVAL ? ?Patient Name: Dustin Parks ?MRN: [id number] ?DOB:01/16/2001, 21 y.o., male ?Today's Date: 02/03/2022 ? ?PCP: Haydee Salter, MD ?REFERRING PROVIDER: Dr Donivan Scull  ? ? PT End of Session - 02/03/22 0814   ? ? Visit Number 20   ? Number of Visits 32   ? Date for PT Re-Evaluation 03/16/22   ? Authorization Type MCE   ? PT Start Time 0815   ? PT Stop Time 0900   ? PT Time Calculation (min) 45 min   ? Activity Tolerance Patient tolerated treatment well   ? Behavior During Therapy [facility] for tasks assessed/performed   ? ?  ?  ? ?  ? ? ? ? ? ? ? ? ? ? ? ? ?Past Medical History:  ?Diagnosis Date  ? Allergy   ? Asthma   ? out grown now   ? ?Past Surgical History:  ?Procedure Laterality Date  ? KNEE ARTHROSCOPY WITH ANTERIOR CRUCIATE LIGAMENT (ACL) REPAIR WITH HAMSTRING GRAFT Left 11/08/2021  ? Procedure: LEFT KNEE ANTERIOR CRUCIATE LIGAMENT RECONSTRUCTION WITH QUADRICEPS TENDON AUTOGRAFT;  Surgeon: [name], MD;  Location: [facility];  Service: Orthopedics;  Laterality: Left;  ? KNEE ARTHROSCOPY WITH MENISCAL REPAIR  11/08/2021  ? Procedure: LEFT LATERAL MENISCAL REPAIR;  Surgeon: [name], MD;  Location: [facility];  Service: Orthopedics;;  ? [place] EXTRACTION  2022  ? ?Patient Active Problem List  ? Diagnosis Date Noted  ? Peripheral tear of lateral meniscus of left knee as current injury   ? Arthrofibrosis of knee joint, left 01/31/2022  ? S/P repair of anterior cruciate ligament 01/31/2022  ? Morbid obesity with BMI of 40.0-44.9, adult ([place]) 01/31/2022  ? Hypertriglyceridemia 01/31/2022  ? Angioedema of lips- [place] 12/13/2021  ? Complex tear of lateral meniscus of left knee as current injury   ? History of orchitis 02/08/2021  ? ?  ?PCP: Libby Maw, MD ?  ?REFERRING PROVIDER: [name], MD ?  ?REFERRING DIAG: U13.244W (ICD-10-CM) - Rupture of anterior cruciate ligament of left knee, initial  encounter  ?     N02.725D (ICD-10-CM) - Complex tear of lateral meniscus of left knee as current injury, initial encounter      Z98.890 (ICD-10-CM) - S/P left knee arthroscopy  ?  ?THERAPY DIAG:  ?Left knee pain, unspecified chronicity ?  ?Stiffness of left knee, not elsewhere classified ?  ?Muscle weakness (generalized) ?  ?Difficulty in walking, not elsewhere classified ?  ?ONSET DATE: Injury 10/09/2021 / Surgery 11/08/2021 ?  ?SUBJECTIVE:  ?  ?SUBJECTIVE STATEMENT: ?Pt reports that his knee manipulation went well yesterday, and he reports he has no pain currently. He reports that he thinks the nerve block is still in effect since he has not taken any pain medication today. He also reports that he has been doing knee flexion AAROM since his procedure.  ? ?PERTINENT HISTORY: ?11/08/2021 Left knee ACL reconstruction with quadriceps autograft and lateral meniscal repair.   ? ?02/02/2022: Lt knee lysis of adheresion and manipulation under anesthesia ? ?Allergy to certain adhesives including tegaderm per pt report  ?  ?PAIN:  ?Are you having pain? No ?NPRS scale: 0/10 current, 3/10 worst pain ?Pain location: right lateral knee  ?Pain orientation: Right  ?PAIN TYPE: aching ?Pain description: intermittent  ?Aggravating factors: use of the knee / falling off the bed  ?Relieving factors: rest   ?  ?PRECAUTIONS: Other: per Dr. Eddie Dibbles ACL reconstruction with  meniscal repair ?  ?WEIGHT BEARING RESTRICTIONS Yes  ?  ?  ?OCCUPATION: in culinary school   ?  ?PLOF: Independent; Pt was able to perform all of his ADLs/IADLs and functional mobility skills without difficulty or limitations.  Pt was able to perform work activities.  Pt ambulated without an AD with good stability without difficulty.  ?  ?PATIENT GOALS improved stability with leaning, to have the ability to run, to be able to bowl.  Return to PLOF.  ?  ?  ?OBJECTIVE:  ? ? ? ?LE ROM: ? ?A/PROM Right ?02/03/2022 Left ?02/03/2022  ?Knee flexion  93 AROM/ 97 AAROM/ 103PROM/ 106  PROM following MET  ?Knee extension  -1 AROM/ 0 PROM  ? (Blank rows = not tested) ? ?LE MMT: ? ?MMT Right ?02/03/2022 Left ?02/03/2022  ?Hip flexion    ?Hip extension    ?Hip abduction    ?Knee flexion    ?Knee extension    ?Ankle dorsiflexion    ?Ankle plantarflexion    ?Ankle inversion    ?Ankle eversion    ? (Blank rows = not tested) ? ? FUNCTIONAL TESTS: ? -Dead lift: Good form and depth with no pain ?  ?TODAY'S TREATMENT: ? ?[place] Adult PT Treatment:                                                DATE: 02/03/2022 ?Therapeutic Exercise: ?Lt knee flexion AROM in supine x5 ?Lt knee flexion AAROM with strap in supine x5 with 5-sec hold at top ?Prone knee hangs with 6# ankle weights 3x30sec ?Prone hamstring curls with 6# ankle weights 3x10 ?Dead lift in front of mat table with two 10# kettlebells 2x8 ?Manual Therapy: ?Lt knee flexion PROM x5 with 30 second hold ?Lt knee flexion contract/ co-contract MET x5 with 5-sec force production followed by 30sec hold at end-range ?Neuromuscular re-ed: ?N/A ?Therapeutic Activity: ?N/A ?Modalities: ?N/A ?Self Care: ?N/A ? ? ?[place] Adult PT Treatment:                                                DATE: 01/29/2022 ?Before treatment  total arc 10 - 67 ?After treatment total arc 6 - 75 ? ?Therapeutic Exercise: ?Modified bridge with legs on elevated bolsters 2 x 12 ?SLR combined with quad set 2 x 10 - (pt had quad lag of 20 degrees) ?Leg press 2 x 12 40#, 1 set bil LE con/ecc, 2nd set con bil LE/ ECC LLE  ?Manual Therapy: ?STW along the rectus femoris  ?AP grade III with pt flexing knee intermittently and blocking the knee from extending ?Antegrade / retrograde massage to reduce edema ?Tibiofemoral distraction grade IV in sitting.  ? ? ?  ?PATIENT EDUCATION:  ?Education details:  reviewed gait biomechanics and forming a good habit to stop when he notices he is limping or overutilizing his RLE and reset utilizing the proper form with heel strike/ toe off.  ?Person educated: Patient ?Education  method: Explanation, Demonstration, Tactile cues, Verbal cues, and Handouts ?Education comprehension: verbalized understanding, returned demonstration, verbal cues required, tactile cues required, and needs further education ?  ?  ?HOME EXERCISE PROGRAM: ?Access Code: [email or web address] ?URL: [email or web address] ?Date: 11/13/2021 ?Prepared by: Ronny Flurry ?  ? ?  ?  ?  ASSESSMENT: ?  ?CLINICAL IMPRESSION: ?The pt presents today following a Lt knee manipulation under anesthesia yesterday. His initial ROM is improved compared to pre-manipulation range. This improved in session with AAROM, PROM, and MET. He also demonstrates the ability to perform loaded dead lifts with good form and no pain today. The pt will continue to benefit from skilled PT to address his primary impairments and return to his prior level of function with less limitation. ? ?Objective impairments include Abnormal gait, decreased activity tolerance, decreased balance, decreased mobility, difficulty walking, decreased ROM, decreased strength, hypomobility, increased edema, impaired flexibility, and pain. These impairments are limiting patient from cleaning, community activity, driving, meal prep, occupation, laundry, shopping, school, and ambulation . ?  ?  ?REHAB POTENTIAL: Good ?  ?CLINICAL DECISION MAKING: Stable/uncomplicated ?  ?EVALUATION COMPLEXITY: Low ?  ?  ?GOALS: ?  ?  ?SHORT TERM GOALS: ?  ?STG Name Target Date Goal status  ?1 Pt will be independent and compliant with HEP for improved pain, strength, and ROM.  ?Baseline:  12/11/2021 PARTIALLY MET  ?2 Pt will demo a good quad set and perform supine SLRs independently with brace for improved quad strength. ?Baseline:  11/27/2021 PROGRESSING  ?3 Pt will demo improved improved L knee extension PROM/AROM to 0/3 and flexion PROM to 60 deg for improved mobility and stiffness. ?Baseline: 12/04/2021 GOAL MET  ?4 Pt will demo improved improved L knee extension AROM to 0 deg and  flexion  AAROM/PROM to 80/90 deg for improved mobility and stiffness. ?Baseline: 12/18/2021 GOAL MET  ?5 Pt will demo L knee AROM to be 0 - 120 deg for improved stiffness and performance of functional mobility.   ?Baseline: 3

## 2022-02-02 NOTE — Brief Op Note (Signed)
? ?  Brief Op Note ? ?Date of Surgery: ?02/02/2022 ? ?Preoperative Diagnosis: ?LEFT KNEE ARTHROFIBROSIS ? ?Postoperative Diagnosis: ?same ? ?Procedure: ?Procedure(s): ?LEFT ARTHROSCOPY KNEE LYSIS OF ADHESION AND MANIPULATION UNDER ANESTHESIA ? ?Implants: ?* No implants in log * ? ?Surgeons: ?Surgeon(s): ?Huel Cote, MD ? ?Anesthesia: ?General ? ? ? ?Estimated Blood Loss: ?See anesthesia record ? ?Complications: ?None ? ?Condition to PACU: ?Stable ? ?Benancio Deeds, MD ?02/02/2022 ?1:57 PM ? ?

## 2022-02-02 NOTE — Progress Notes (Signed)
AssistedDr. Ellender with left, adductor canal, ultrasound guided block. Side rails up, monitors on throughout procedure. See vital signs in flow sheet. Tolerated Procedure well. ? ?

## 2022-02-03 ENCOUNTER — Ambulatory Visit: Payer: No Typology Code available for payment source | Attending: Family Medicine

## 2022-02-03 DIAGNOSIS — M25562 Pain in left knee: Secondary | ICD-10-CM | POA: Insufficient documentation

## 2022-02-03 DIAGNOSIS — R262 Difficulty in walking, not elsewhere classified: Secondary | ICD-10-CM | POA: Insufficient documentation

## 2022-02-03 DIAGNOSIS — M25662 Stiffness of left knee, not elsewhere classified: Secondary | ICD-10-CM | POA: Insufficient documentation

## 2022-02-03 DIAGNOSIS — M6281 Muscle weakness (generalized): Secondary | ICD-10-CM | POA: Insufficient documentation

## 2022-02-03 DIAGNOSIS — M24662 Ankylosis, left knee: Secondary | ICD-10-CM | POA: Insufficient documentation

## 2022-02-05 ENCOUNTER — Encounter: Payer: Self-pay | Admitting: Physical Therapy

## 2022-02-05 ENCOUNTER — Ambulatory Visit: Payer: No Typology Code available for payment source | Admitting: Physical Therapy

## 2022-02-05 DIAGNOSIS — M25562 Pain in left knee: Secondary | ICD-10-CM

## 2022-02-05 DIAGNOSIS — M25662 Stiffness of left knee, not elsewhere classified: Secondary | ICD-10-CM

## 2022-02-05 DIAGNOSIS — R262 Difficulty in walking, not elsewhere classified: Secondary | ICD-10-CM

## 2022-02-05 DIAGNOSIS — M6281 Muscle weakness (generalized): Secondary | ICD-10-CM

## 2022-02-05 NOTE — Therapy (Signed)
?OUTPATIENT PHYSICAL THERAPY TREATMENT NOTE ? ?Patient Name: Dustin Parks ?MRN: [id number] ?DOB:09/25/2001, 21 y.o., male ?Today's Date: 02/05/2022 ? ?PCP: Haydee Salter, MD ?REFERRING PROVIDER: Dr Donivan Scull  ? ? PT End of Session - 02/05/22 0805   ? ? Visit Number 21   ? Number of Visits 32   ? Date for PT Re-Evaluation 03/16/22   ? Authorization Type MCE   ? PT Start Time 0805   ? PT Stop Time 0845   ? PT Time Calculation (min) 40 min   ? Equipment Utilized During Treatment Other (comment)   ? Behavior During Therapy [facility] for tasks assessed/performed   ? ?  ?  ? ?  ? ? ? ? ? ? ? ? ? ? ? ? ? ?Past Medical History:  ?Diagnosis Date  ? Allergy   ? Asthma   ? out grown now   ? ?Past Surgical History:  ?Procedure Laterality Date  ? KNEE ARTHROSCOPY WITH ANTERIOR CRUCIATE LIGAMENT (ACL) REPAIR WITH HAMSTRING GRAFT Left 11/08/2021  ? Procedure: LEFT KNEE ANTERIOR CRUCIATE LIGAMENT RECONSTRUCTION WITH QUADRICEPS TENDON AUTOGRAFT;  Surgeon: [name], MD;  Location: [place];  Service: Orthopedics;  Laterality: Left;  ? KNEE ARTHROSCOPY WITH MENISCAL REPAIR  11/08/2021  ? Procedure: LEFT LATERAL MENISCAL REPAIR;  Surgeon: [name], MD;  Location: [place];  Service: Orthopedics;;  ? [place] EXTRACTION  2022  ? ?Patient Active Problem List  ? Diagnosis Date Noted  ? Peripheral tear of lateral meniscus of left knee as current injury   ? Arthrofibrosis of knee joint, left 01/31/2022  ? S/P repair of anterior cruciate ligament 01/31/2022  ? Morbid obesity with BMI of 40.0-44.9, adult ([place]) 01/31/2022  ? Hypertriglyceridemia 01/31/2022  ? Angioedema of lips- [place] 12/13/2021  ? Complex tear of lateral meniscus of left knee as current injury   ? History of orchitis 02/08/2021  ? ?  ?PCP: Libby Maw, MD ?  ?REFERRING PROVIDER: [name], MD ?  ?REFERRING DIAG: V78.588F (ICD-10-CM) - Rupture of anterior cruciate ligament of left knee, initial encounter  ?      O27.741O (ICD-10-CM) - Complex tear of lateral meniscus of left knee as current injury, initial encounter      Z98.890 (ICD-10-CM) - S/P left knee arthroscopy  ?  ?THERAPY DIAG:  ?Left knee pain, unspecified chronicity ?  ?Stiffness of left knee, not elsewhere classified ?  ?Muscle weakness (generalized) ?  ?Difficulty in walking, not elsewhere classified ?  ?ONSET DATE: Injury 10/09/2021 / Surgery 11/08/2021 ?  ?SUBJECTIVE:  ?  ?SUBJECTIVE STATEMENT: ?"I am feeling pretty tight today, I've been working it all weekend I am not going to let the scar tissue form ? ?PERTINENT HISTORY: ?11/08/2021 Left knee ACL reconstruction with quadriceps autograft and lateral meniscal repair.   ? ?02/02/2022: Lt knee lysis of adheresion and manipulation under anesthesia ? ?Allergy to certain adhesives including tegaderm per pt report  ?  ?PAIN:  ?Are you having pain? No ?NPRS scale: 0/10 at rest, 3/10 with activity. ?Pain location: right lateral knee  ?Pain orientation: Right  ?PAIN TYPE: aching ?Pain description: intermittent  ?Aggravating factors: use of the knee / falling off the bed  ?Relieving factors: rest   ?  ?PRECAUTIONS: Other: per Dr. Eddie Dibbles ACL reconstruction with meniscal repair ?  ?WEIGHT BEARING RESTRICTIONS Yes  ?  ?  ?OCCUPATION: in culinary school   ?  ?PLOF: Independent; Pt was able to perform all of his  ADLs/IADLs and functional mobility skills without difficulty or limitations.  Pt was able to perform work activities.  Pt ambulated without an AD with good stability without difficulty.  ?  ?PATIENT GOALS improved stability with leaning, to have the ability to run, to be able to bowl.  Return to PLOF.  ?  ?  ?OBJECTIVE:  ? ? ? ?LE ROM: ? ?A/PROM Right ?02/05/2022 Left ?02/03/2022  ?Knee flexion  93 AROM/ 97 AAROM/ 103PROM/ 106 PROM following MET  ?Knee extension  -1 AROM/ 0 PROM  ? (Blank rows = not tested) ? ?LE MMT: ? ?MMT Right ?02/05/2022 Left ?02/05/2022  ?Hip flexion    ?Hip extension    ?Hip abduction    ?Knee  flexion    ?Knee extension    ?Ankle dorsiflexion    ?Ankle plantarflexion    ?Ankle inversion    ?Ankle eversion    ? (Blank rows = not tested) ? ? FUNCTIONAL TESTS: ? -Dead lift: Good form and depth with no pain ?  ?TODAY'S TREATMENT: ? ?[place] Adult PT Treatment:                                                DATE: 02/05/2022 ?AROM L knee total arc 7 - 90 A tx  ?AROM L knee flexion 109 degrees     P TX ? ?Therapeutic Exercise: ?Recumbent bike L1 full revolutions forward x 5 min  ?Hamstring stretch PNF contract/ relax 2 x 30 sec ?Thomas test quad stretch PNF contract relax 2 x 30  ?Wall slides x 4 min 5# ?Step downs 2 x 15 from 6 inch step ?Bridge 2 x 10 with feet on black foam roller to promote hamstring activation ?Manual Therapy: ?Tibiofemoral PA with grade IV with AROM/ AAROM flexion to max end range between bouts x 4 sets fo 30 ?PROM knee flexion working into end range seated on EOB ? ? ? ? ?[place] Adult PT Treatment:                                                DATE: 02/03/2022 ?Therapeutic Exercise: ?Lt knee flexion AROM in supine x5 ?Lt knee flexion AAROM with strap in supine x5 with 5-sec hold at top ?Prone knee hangs with 6# ankle weights 3x30sec ?Prone hamstring curls with 6# ankle weights 3x10 ?Dead lift in front of mat table with two 10# kettlebells 2x8 ?Manual Therapy: ?Lt knee flexion PROM x5 with 30 second hold ?Lt knee flexion contract/ co-contract MET x5 with 5-sec force production followed by 30sec hold at end-range ?Neuromuscular re-ed: ?N/A ?Therapeutic Activity: ?N/A ?Modalities: ?N/A ?Self Care: ?N/A ? ? ?[place] Adult PT Treatment:                                                DATE: 01/29/2022 ?Before treatment  total arc 10 - 67 ?After treatment total arc 6 - 75 ? ?Therapeutic Exercise: ?Modified bridge with legs on elevated bolsters 2 x 12 ?SLR combined with quad set 2 x 10 - (pt had quad lag of 20 degrees) ?Leg press 2 x 12  40#, 1 set bil LE con/ecc, 2nd set con bil LE/ ECC LLE  ?Manual  Therapy: ?STW along the rectus femoris  ?AP grade III with pt flexing knee intermittently and blocking the knee from extending ?Antegrade / retrograde massage to reduce edema ?Tibiofemoral distraction grade IV in sitting.  ? ? ?  ?PATIENT EDUCATION:  ?Education details:  reviewed gait biomechanics and forming a good habit to stop when he notices he is limping or overutilizing his RLE and reset utilizing the proper form with heel strike/ toe off.  ?Person educated: Patient ?Education method: Explanation, Demonstration, Tactile cues, Verbal cues, and Handouts ?Education comprehension: verbalized understanding, returned demonstration, verbal cues required, tactile cues required, and needs further education ?  ?  ?HOME EXERCISE PROGRAM: ?Access Code: [email or web address] ?URL: [email or web address] ?Date: 11/13/2021 ?Prepared by: Ronny Flurry ?  ? ?  ?  ?ASSESSMENT: ?  ?CLINICAL IMPRESSION: ?Dustin Parks reports he has been more consistent with his HEP and working on his knee ROM at home. Today he started at 90 degrees of flexion and following session he increased AAROM to 109.  He was able to perform full revolutions on the recumbent bike going forward today with minmal compensation noted. Continued working on knee strengthening to help facilitate and maintain the ROM that was achieved during session today.  ? ?Objective impairments include Abnormal gait, decreased activity tolerance, decreased balance, decreased mobility, difficulty walking, decreased ROM, decreased strength, hypomobility, increased edema, impaired flexibility, and pain. These impairments are limiting patient from cleaning, community activity, driving, meal prep, occupation, laundry, shopping, school, and ambulation . ?  ?  ?REHAB POTENTIAL: Good ?  ?CLINICAL DECISION MAKING: Stable/uncomplicated ?  ?EVALUATION COMPLEXITY: Low ?  ?  ?GOALS: ?  ?  ?SHORT TERM GOALS: ?  ?STG Name Target Date Goal status  ?1 Pt will be independent and compliant with HEP for  improved pain, strength, and ROM.  ?Baseline:  12/11/2021 PARTIALLY MET  ?2 Pt will demo a good quad set and perform supine SLRs independently with brace for improved quad strength. ?Baseline:  11/27/2021 PROGRESS

## 2022-02-07 ENCOUNTER — Ambulatory Visit: Payer: No Typology Code available for payment source | Admitting: Physical Therapy

## 2022-02-07 DIAGNOSIS — M25662 Stiffness of left knee, not elsewhere classified: Secondary | ICD-10-CM

## 2022-02-07 DIAGNOSIS — M25562 Pain in left knee: Secondary | ICD-10-CM | POA: Diagnosis not present

## 2022-02-07 DIAGNOSIS — M6281 Muscle weakness (generalized): Secondary | ICD-10-CM

## 2022-02-07 NOTE — Therapy (Signed)
?OUTPATIENT PHYSICAL THERAPY TREATMENT NOTE ? ?Patient Name: Dustin Parks ?MRN: [id number] ?DOB:Apr 18, 2001, 21 y.o., male ?Today's Date: 02/07/2022 ? ?PCP: Haydee Salter, MD ?REFERRING PROVIDER: Dr Donivan Scull  ? ? PT End of Session - 02/07/22 1501   ? ? Visit Number 22   ? Number of Visits 32   ? Date for PT Re-Evaluation 03/16/22   ? Authorization Type MCE   ? PT Start Time 1501   ? PT Stop Time 7035   ? PT Time Calculation (min) 46 min   ? Equipment Utilized During Treatment Other (comment)   ? Activity Tolerance Patient tolerated treatment well   ? Behavior During Therapy [facility] for tasks assessed/performed   ? ?  ?  ? ?  ? ? ? ? ? ? ? ? ? ? ? ? ? ? ?Past Medical History:  ?Diagnosis Date  ? Allergy   ? Asthma   ? out grown now   ? ?Past Surgical History:  ?Procedure Laterality Date  ? KNEE ARTHROSCOPY Left 02/02/2022  ? Procedure: LEFT ARTHROSCOPY KNEE LYSIS OF ADHESION AND MANIPULATION UNDER ANESTHESIA;  Surgeon: [name], MD;  Location: [place];  Service: Orthopedics;  Laterality: Left;  ? KNEE ARTHROSCOPY WITH ANTERIOR CRUCIATE LIGAMENT (ACL) REPAIR WITH HAMSTRING GRAFT Left 11/08/2021  ? Procedure: LEFT KNEE ANTERIOR CRUCIATE LIGAMENT RECONSTRUCTION WITH QUADRICEPS TENDON AUTOGRAFT;  Surgeon: [name], MD;  Location: [place];  Service: Orthopedics;  Laterality: Left;  ? KNEE ARTHROSCOPY WITH MENISCAL REPAIR  11/08/2021  ? Procedure: LEFT LATERAL MENISCAL REPAIR;  Surgeon: [name], MD;  Location: [place];  Service: Orthopedics;;  ? [place] EXTRACTION  2022  ? ?Patient Active Problem List  ? Diagnosis Date Noted  ? Peripheral tear of lateral meniscus of left knee as current injury   ? Arthrofibrosis of knee joint, left 01/31/2022  ? S/P repair of anterior cruciate ligament 01/31/2022  ? Morbid obesity with BMI of 40.0-44.9, adult ([place]) 01/31/2022  ? Hypertriglyceridemia 01/31/2022  ? Angioedema of lips- [place] 12/13/2021  ?  Complex tear of lateral meniscus of left knee as current injury   ? History of orchitis 02/08/2021  ? ?  ?PCP: Libby Maw, MD ?  ?REFERRING PROVIDER: [name], MD ?  ?REFERRING DIAG: K09.381W (ICD-10-CM) - Rupture of anterior cruciate ligament of left knee, initial encounter  ?     E99.371I (ICD-10-CM) - Complex tear of lateral meniscus of left knee as current injury, initial encounter      Z98.890 (ICD-10-CM) - S/P left knee arthroscopy  ?  ?THERAPY DIAG:  ?Left knee pain, unspecified chronicity ?  ?Stiffness of left knee, not elsewhere classified ?  ?Muscle weakness (generalized) ?  ?Difficulty in walking, not elsewhere classified ?  ?ONSET DATE: Injury 10/09/2021 / Surgery 11/08/2021 ?  ?SUBJECTIVE:  ?  ?SUBJECTIVE STATEMENT: ?"The knee is feeling tight, due to the last few days due to not being abel to strap it. I was unable to ice before coming today." ? ?PERTINENT HISTORY: ?11/08/2021 Left knee ACL reconstruction with quadriceps autograft and lateral meniscal repair.   ? ?02/02/2022: Lt knee lysis of adheresion and manipulation under anesthesia ? ?Allergy to certain adhesives including tegaderm per pt report  ?  ?PAIN:  ?Are you having pain? No ?NPRS scale: 0/10 at rest,  ?Pain location: right lateral knee  ?Pain orientation: Right  ?PAIN TYPE: aching ?Pain description: intermittent  ?Aggravating factors: use of the knee / falling off  the bed  ?Relieving factors: rest   ?  ?PRECAUTIONS: Other: per Dr. Eddie Dibbles ACL reconstruction with meniscal repair ?  ?WEIGHT BEARING RESTRICTIONS Yes  ?  ?  ?OCCUPATION: in culinary school   ?  ?PLOF: Independent; Pt was able to perform all of his ADLs/IADLs and functional mobility skills without difficulty or limitations.  Pt was able to perform work activities.  Pt ambulated without an AD with good stability without difficulty.  ?  ?PATIENT GOALS improved stability with leaning, to have the ability to run, to be able to bowl.  Return to PLOF.  ?  ?  ?OBJECTIVE:   ? ? ? ?LE ROM: ? ?A/PROM Right ? Left ?02/03/2022  ?Knee flexion  93 AROM/ 97 AAROM/ 103PROM/ 106 PROM following MET  ?Knee extension  -1 AROM/ 0 PROM  ? (Blank rows = not tested) ? ?LE MMT: ? ?MMT Right ? Left ?  ?Hip flexion    ?Hip extension    ?Hip abduction    ?Knee flexion    ?Knee extension    ?Ankle dorsiflexion    ?Ankle plantarflexion    ?Ankle inversion    ?Ankle eversion    ? (Blank rows = not tested) ? ? FUNCTIONAL TESTS: ? -Dead lift: Good form and depth with no pain ?  ?TODAY'S TREATMENT: ?[facility] Adult PT Treatment:                                                DATE: 02/07/2022 ?AROM L knee total arc 5  - 101 A tx  ?AROM L knee flexion 106 degrees     P TX ? ?Therapeutic Exercise: ?Quad stretch 2 x 30 sec in thomas test position ?Recumbent bike L 2 x 6 min lowering seat ever 2 min to promote knee flexion ROM ?Standing functional squat holding onto freemotion 2 x 12 ?Hamstring curl 2 x 15 20#, con bil/ ecc LLE  ?Standing knee flexion with foot in chair leaning forward 3 x 30 sec ?Step down with 6 inch step on LLE 2 x 15 (cues to avoid trunk lean,  ?Quadruped on the table rocking backward holding 2-3 seconds ea. ?Manual Therapy: ?PROM knee flexion working into end range seated on EOB with distraction - cues to avoid weight shifting to the R  ? ? ?[facility] Adult PT Treatment:                                                DATE: 02/05/2022 ?AROM L knee total arc 7 - 90 A tx  ?AROM L knee flexion 109 degrees     P TX ? ?Therapeutic Exercise: ?Recumbent bike L1 full revolutions forward x 5 min  ?Hamstring stretch PNF contract/ relax 2 x 30 sec ?Thomas test quad stretch PNF contract relax 2 x 30  ?Wall slides x 4 min 5# ?Step downs 2 x 15 from 6 inch step ?Bridge 2 x 10 with feet on black foam roller to promote hamstring activation ?Manual Therapy: ?Tibiofemoral PA with grade IV with AROM/ AAROM flexion to max end range between bouts x 4 sets fo 30 ?PROM knee flexion working into end range seated on EOB ? ? ?[place]  Adult PT Treatment:  DATE: 02/03/2022 ?Therapeutic Exercise: ?Lt knee flexion AROM in supine x5 ?Lt knee flexion AAROM with strap in supine x5 with 5-sec hold at top ?Prone knee hangs with 6# ankle weights 3x30sec ?Prone hamstring curls with 6# ankle weights 3x10 ?Dead lift in front of mat table with two 10# kettlebells 2x8 ?Manual Therapy: ?Lt knee flexion PROM x5 with 30 second hold ?Lt knee flexion contract/ co-contract MET x5 with 5-sec force production followed by 30sec hold at end-range ?Neuromuscular re-ed: ?N/A ?Therapeutic Activity: ?N/A ?Modalities: ?N/A ?Self Care: ?N/A ?  ?PATIENT EDUCATION:  ?Education details:  reviewed gait biomechanics and forming a good habit to stop when he notices he is limping or overutilizing his RLE and reset utilizing the proper form with heel strike/ toe off.  ?Person educated: Patient ?Education method: Explanation, Demonstration, Tactile cues, Verbal cues, and Handouts ?Education comprehension: verbalized understanding, returned demonstration, verbal cues required, tactile cues required, and needs further education ?  ?  ?HOME EXERCISE PROGRAM: ?Access Code: [email or web address] ?URL: [email or web address] ?Date: 11/13/2021 ?Prepared by: Ronny Flurry ?  ? ?  ?  ?ASSESSMENT: ?  ?CLINICAL IMPRESSION: ?Keijuan continues to report no pain at rest. Today initially his knee ROM was 5 - 101 which he reports his difficulty due to having weakness in his hamstrings. Cotninued working [employer] along the quad and IASTM along on the patellar tendon to reduce tension while working on knee flexion while seated EOB. Continued working on knee ROM which he did well with the recumbent bike going full revolutions forward and gradually dropping seat to promote knee flexion. He did well with all exercises with some compensation noted which he was able to correct with verbal cues. Reviewed quadruped rocking to promote knee flexion and added to his HEP.  End of session his knee flexion increased to 106 degrees.  ? ?Objective impairments include Abnormal gait, decreased activity tolerance, decreased balance, decreased mobility, difficulty walking, decreased ROM

## 2022-02-08 ENCOUNTER — Encounter: Payer: Self-pay | Admitting: Physical Therapy

## 2022-02-08 ENCOUNTER — Ambulatory Visit: Payer: No Typology Code available for payment source | Admitting: Physical Therapy

## 2022-02-08 ENCOUNTER — Encounter (HOSPITAL_BASED_OUTPATIENT_CLINIC_OR_DEPARTMENT_OTHER): Payer: No Typology Code available for payment source | Admitting: Orthopaedic Surgery

## 2022-02-08 DIAGNOSIS — M25562 Pain in left knee: Secondary | ICD-10-CM | POA: Diagnosis not present

## 2022-02-08 DIAGNOSIS — M25662 Stiffness of left knee, not elsewhere classified: Secondary | ICD-10-CM

## 2022-02-08 DIAGNOSIS — M6281 Muscle weakness (generalized): Secondary | ICD-10-CM

## 2022-02-08 NOTE — Therapy (Signed)
?OUTPATIENT PHYSICAL THERAPY TREATMENT NOTE ? ?Patient Name: Dustin Parks ?MRN: [id number] ?DOB:[birth year], 21 y.o., male ?Today's Date: 02/08/2022 ? ?PCP: Haydee Salter, MD ?REFERRING PROVIDER: Dr Donivan Scull  ? ? PT End of Session - 02/08/22 1316   ? ? Visit Number 23   ? Number of Visits 32   ? Date for PT Re-Evaluation 03/16/22   ? Authorization Type MCE   ? PT Start Time 2248   ? PT Stop Time 1402   ? PT Time Calculation (min) 46 min   ? Activity Tolerance Patient tolerated treatment well   ? Behavior During Therapy [facility] for tasks assessed/performed   ? ?  ?  ? ?  ? ? ? ? ? ? ? ? ? ? ? ? ? ? ? ?Past Medical History:  ?Diagnosis Date  ? Allergy   ? Asthma   ? out grown now   ? ?Past Surgical History:  ?Procedure Laterality Date  ? KNEE ARTHROSCOPY Left 02/02/2022  ? Procedure: LEFT ARTHROSCOPY KNEE LYSIS OF ADHESION AND MANIPULATION UNDER ANESTHESIA;  Surgeon: [name], MD;  Location: [place];  Service: Orthopedics;  Laterality: Left;  ? KNEE ARTHROSCOPY WITH ANTERIOR CRUCIATE LIGAMENT (ACL) REPAIR WITH HAMSTRING GRAFT Left 11/08/2021  ? Procedure: LEFT KNEE ANTERIOR CRUCIATE LIGAMENT RECONSTRUCTION WITH QUADRICEPS TENDON AUTOGRAFT;  Surgeon: [name], MD;  Location: [place];  Service: Orthopedics;  Laterality: Left;  ? KNEE ARTHROSCOPY WITH MENISCAL REPAIR  11/08/2021  ? Procedure: LEFT LATERAL MENISCAL REPAIR;  Surgeon: [name], MD;  Location: [place];  Service: Orthopedics;;  ? [place] EXTRACTION  2022  ? ?Patient Active Problem List  ? Diagnosis Date Noted  ? Peripheral tear of lateral meniscus of left knee as current injury   ? Arthrofibrosis of knee joint, left 01/31/2022  ? S/P repair of anterior cruciate ligament 01/31/2022  ? Morbid obesity with BMI of 40.0-44.9, adult ([place]) 01/31/2022  ? Hypertriglyceridemia 01/31/2022  ? Angioedema of lips- [place] 12/13/2021  ? Complex tear of lateral meniscus of left knee as current  injury   ? History of orchitis 02/08/2021  ? ?  ?PCP: Libby Maw, MD ?  ?REFERRING PROVIDER: [name], MD ?  ?REFERRING DIAG: G50.037C (ICD-10-CM) - Rupture of anterior cruciate ligament of left knee, initial encounter  ?     W88.891Q (ICD-10-CM) - Complex tear of lateral meniscus of left knee as current injury, initial encounter      Z98.890 (ICD-10-CM) - S/P left knee arthroscopy  ?  ?THERAPY DIAG:  ?Left knee pain, unspecified chronicity ?  ?Stiffness of left knee, not elsewhere classified ?  ?Muscle weakness (generalized) ?  ?Difficulty in walking, not elsewhere classified ?  ?ONSET DATE: Injury 10/09/2021 / Surgery 11/08/2021 ?  ?SUBJECTIVE:  ?  ?SUBJECTIVE STATEMENT: ?"I am a little more sore today. I did stretch and iced it after the last session but didn't ? ?PERTINENT HISTORY: ?11/08/2021 Left knee ACL reconstruction with quadriceps autograft and lateral meniscal repair.   ? ?02/02/2022: Lt knee lysis of adheresion and manipulation under anesthesia ? ?Allergy to certain adhesives including tegaderm per pt report  ?  ?PAIN:  ?Are you having pain? No ?NPRS scale: 1/10 at rest,  ?Pain location: right lateral knee  ?Pain orientation: Right  ?PAIN TYPE: aching ?Pain description: intermittent  ?Aggravating factors: use of the knee / falling off the bed  ?Relieving factors: rest   ?  ?PRECAUTIONS: Other: per Dr. Eddie Dibbles ACL  reconstruction with meniscal repair ?  ?WEIGHT BEARING RESTRICTIONS Yes  ?  ?  ?OCCUPATION: in culinary school   ?  ?PLOF: Independent; Pt was able to perform all of his ADLs/IADLs and functional mobility skills without difficulty or limitations.  Pt was able to perform work activities.  Pt ambulated without an AD with good stability without difficulty.  ?  ?PATIENT GOALS improved stability with leaning, to have the ability to run, to be able to bowl.  Return to PLOF.  ?  ?  ?OBJECTIVE:  ? ? ? ?LE ROM: ? ?A/PROM Right ? Left ?02/03/2022  ?Knee flexion  93 AROM/ 97 AAROM/ 103PROM/  106 PROM following MET  ?Knee extension  -1 AROM/ 0 PROM  ? (Blank rows = not tested) ? ?LE MMT: ? ?MMT Right ? Left ?  ?Hip flexion    ?Hip extension    ?Hip abduction    ?Knee flexion    ?Knee extension    ?Ankle dorsiflexion    ?Ankle plantarflexion    ?Ankle inversion    ?Ankle eversion    ? (Blank rows = not tested) ? ? FUNCTIONAL TESTS: ? -Dead lift: Good form and depth with no pain ?  ?TODAY'S TREATMENT: ? ?[facility] Adult PT Treatment:                                                DATE: 02/08/2022 ?AROM L knee total arc  4- 100 A tx  ?AROM L knee flexion 107  degrees   P TX ? ?Therapeutic Exercise: ?Recumbent bike L 2 x 6 min lowering seat ever 2 min to promote knee flexion ROM ?Prone quad stretch with strap 3 x 30 sec PNF with contract relax  ?Prone hamstring curl 2 x 15 combined with hip extension 5# ?Deadlift with 15# Kettlbel touching down on 6 inch step. Compensation with R weight shift requiring tactile cues to reduce weight shifting ?Hex bar deadlift with 95# 2 x 10 - reduced compensation noted during exercise ?Eccentric hamstring curl 2 x 10 using black theraband.  ?Manual Therapy: ?PROM knee flexion working into end range seated on EOB ? ? ?[facility] Adult PT Treatment:                                                DATE: 02/07/2022 ?AROM L knee total arc 5  - 101 A tx  ?AROM L knee flexion 106 degrees     P TX ? ?Therapeutic Exercise: ?Quad stretch 2 x 30 sec in thomas test position ?Recumbent bike L 2 x 6 min lowering seat ever 2 min to promote knee flexion ROM ?Standing functional squat holding onto freemotion 2 x 12 ?Hamstring curl 2 x 15 20#, con bil/ ecc LLE  ?Standing knee flexion with foot in chair leaning forward 3 x 30 sec ?Step down with 6 inch step on LLE 2 x 15 (cues to avoid trunk lean,  ?Quadruped on the table rocking backward holding 2-3 seconds ea. ?Manual Therapy: ?PROM knee flexion working into end range seated on EOB with distraction - cues to avoid weight shifting to the R  ? ?[facility] Adult PT  Treatment:  DATE: 02/05/2022 ?AROM L knee total arc 7 - 90 A tx  ?AROM L knee flexion 109 degrees     P TX ? ?Therapeutic Exercise: ?Recumbent bike L1 full revolutions forward x 5 min  ?Hamstring stretch PNF contract/ relax 2 x 30 sec ?Thomas test quad stretch PNF contract relax 2 x 30  ?Wall slides x 4 min 5# ?Step downs 2 x 15 from 6 inch step ?Bridge 2 x 10 with feet on black foam roller to promote hamstring activation ?Manual Therapy: ?Tibiofemoral PA with grade IV with AROM/ AAROM flexion to max end range between bouts x 4 sets fo 30 ?PROM knee flexion working into end range seated on EOB ?  ?PATIENT EDUCATION:  ?Education details:  reviewed gait biomechanics and forming a good habit to stop when he notices he is limping or overutilizing his RLE and reset utilizing the proper form with heel strike/ toe off.  ?Person educated: Patient ?Education method: Explanation, Demonstration, Tactile cues, Verbal cues, and Handouts ?Education comprehension: verbalized understanding, returned demonstration, verbal cues required, tactile cues required, and needs further education ?  ?  ?HOME EXERCISE PROGRAM: ?Access Code: [email or web address] ?URL: [email or web address] ?Date: 11/13/2021 ?Prepared by: Ronny Flurry ?  ? ?  ?  ?ASSESSMENT: ?  ?CLINICAL IMPRESSION: ?Cutberto reports increased stiffness since the last session, He measures 100 degress of flexion staring today and increased to 107 following session. Continued working on knee ROM seated with distraction working into end range of motion. Continued working on [employer] which he did demonstrate compensation with RLE lateral weight shifting requiring tactile cues for proper form and reduce compensation. He did well with all exercises today but did fatigue quickly with deadlifting exercises. Goal for next visit is pt to have 105 degees before treatment.  ? ?Objective impairments include Abnormal gait, decreased  activity tolerance, decreased balance, decreased mobility, difficulty walking, decreased ROM, decreased strength, hypomobility, increased edema, impaired flexibility, and pain. These impairments are limiting

## 2022-02-12 ENCOUNTER — Encounter: Payer: Self-pay | Admitting: Physical Therapy

## 2022-02-12 ENCOUNTER — Ambulatory Visit: Payer: No Typology Code available for payment source | Admitting: Physical Therapy

## 2022-02-12 DIAGNOSIS — M25562 Pain in left knee: Secondary | ICD-10-CM | POA: Diagnosis not present

## 2022-02-12 DIAGNOSIS — M6281 Muscle weakness (generalized): Secondary | ICD-10-CM

## 2022-02-12 DIAGNOSIS — M25662 Stiffness of left knee, not elsewhere classified: Secondary | ICD-10-CM

## 2022-02-12 NOTE — Therapy (Signed)
?OUTPATIENT PHYSICAL THERAPY TREATMENT NOTE ? ?Patient Name: Dustin Parks ?MRN: [id number] ?DOB:Mar 05, 2001, 21 y.o., male ?Today's Date: 02/12/2022 ? ?PCP: Haydee Salter, MD ?REFERRING PROVIDER: Dr Donivan Scull  ? ? PT End of Session - 02/12/22 0800   ? ? Visit Number 24   ? Number of Visits 32   ? Date for PT Re-Evaluation 03/16/22   ? Authorization Type MCE   ? PT Start Time 0800   ? PT Stop Time 5638   ? PT Time Calculation (min) 47 min   ? Activity Tolerance Patient tolerated treatment well   ? ?  ?  ? ?  ? ? ? ? ? ? ? ? ? ? ? ? ? ? ? ? ?Past Medical History:  ?Diagnosis Date  ? Allergy   ? Asthma   ? out grown now   ? ?Past Surgical History:  ?Procedure Laterality Date  ? KNEE ARTHROSCOPY Left 02/02/2022  ? Procedure: LEFT ARTHROSCOPY KNEE LYSIS OF ADHESION AND MANIPULATION UNDER ANESTHESIA;  Surgeon: [name], MD;  Location: [place];  Service: Orthopedics;  Laterality: Left;  ? KNEE ARTHROSCOPY WITH ANTERIOR CRUCIATE LIGAMENT (ACL) REPAIR WITH HAMSTRING GRAFT Left 11/08/2021  ? Procedure: LEFT KNEE ANTERIOR CRUCIATE LIGAMENT RECONSTRUCTION WITH QUADRICEPS TENDON AUTOGRAFT;  Surgeon: [name], MD;  Location: [place];  Service: Orthopedics;  Laterality: Left;  ? KNEE ARTHROSCOPY WITH MENISCAL REPAIR  11/08/2021  ? Procedure: LEFT LATERAL MENISCAL REPAIR;  Surgeon: [name], MD;  Location: [place];  Service: Orthopedics;;  ? [place] EXTRACTION  2022  ? ?Patient Active Problem List  ? Diagnosis Date Noted  ? Peripheral tear of lateral meniscus of left knee as current injury   ? Arthrofibrosis of knee joint, left 01/31/2022  ? S/P repair of anterior cruciate ligament 01/31/2022  ? Morbid obesity with BMI of 40.0-44.9, adult ([place]) 01/31/2022  ? Hypertriglyceridemia 01/31/2022  ? Angioedema of lips- [place] 12/13/2021  ? Complex tear of lateral meniscus of left knee as current injury   ? History of orchitis 02/08/2021  ? ?  ?PCP:  Libby Maw, MD ?  ?REFERRING PROVIDER: [name], MD ?  ?REFERRING DIAG: V56.433I (ICD-10-CM) - Rupture of anterior cruciate ligament of left knee, initial encounter  ?     R51.884Z (ICD-10-CM) - Complex tear of lateral meniscus of left knee as current injury, initial encounter      Z98.890 (ICD-10-CM) - S/P left knee arthroscopy  ?  ?THERAPY DIAG:  ?Left knee pain, unspecified chronicity ?  ?Stiffness of left knee, not elsewhere classified ?  ?Muscle weakness (generalized) ?  ?Difficulty in walking, not elsewhere classified ?  ?ONSET DATE: Injury 10/09/2021 / Surgery 11/08/2021 ?  ?SUBJECTIVE:  ?  ?SUBJECTIVE STATEMENT: ?"The knee tight, no pain but I noticed when I stretch it becomes less tight." ? ?PERTINENT HISTORY: ?11/08/2021 Left knee ACL reconstruction with quadriceps autograft and lateral meniscal repair.   ? ?02/02/2022: Lt knee lysis of adheresion and manipulation under anesthesia ? ?Allergy to certain adhesives including tegaderm per pt report  ?  ?PAIN:  ?Are you having pain? No ?NPRS scale: 1/10 at rest,  ?Pain location: right lateral knee  ?Pain orientation: Right  ?PAIN TYPE: aching ?Pain description: intermittent  ?Aggravating factors: use of the knee / falling off the bed  ?Relieving factors: rest   ?  ?PRECAUTIONS: Other: per Dr. Eddie Dibbles ACL reconstruction with meniscal repair ?  ?WEIGHT BEARING RESTRICTIONS Yes  ?  ?  ?  OCCUPATION: in culinary school   ?  ?PLOF: Independent; Pt was able to perform all of his ADLs/IADLs and functional mobility skills without difficulty or limitations.  Pt was able to perform work activities.  Pt ambulated without an AD with good stability without difficulty.  ?  ?PATIENT GOALS improved stability with leaning, to have the ability to run, to be able to bowl.  Return to PLOF.  ?  ?  ?OBJECTIVE:  ? ? ? ?LE ROM: ? ?A/PROM Right ? Left ?02/03/2022  ?Knee flexion  93 AROM/ 97 AAROM/ 103PROM/ 106 PROM following MET  ?Knee extension  -1 AROM/ 0 PROM  ? (Blank  rows = not tested) ? ?LE MMT: ? ?MMT Right ? Left ?  ?Hip flexion    ?Hip extension    ?Hip abduction    ?Knee flexion    ?Knee extension    ?Ankle dorsiflexion    ?Ankle plantarflexion    ?Ankle inversion    ?Ankle eversion    ? (Blank rows = not tested) ? ? FUNCTIONAL TESTS: ? -Dead lift: Good form and depth with no pain ?  ?TODAY'S TREATMENT: ? ?[facility] Adult PT Treatment:                                                DATE: 02/12/2022 ?AROM L knee total arc  3 - 97 A tx  ?AROM L knee flexion 105  degrees   P TX ? ?Therapeutic Exercise: ?PNF contract/ relax quad stretch x 3 holding contraction 10 sec ?Recumbent bike x 6 min lowering seat every 2 min to promote knee flexion ?Functional squat 1 x 20 holding on to free motion ?Standing hamstring curls 2 x 15 4# ?Lunge 1 x 10 touching LLE down onto bosu in // with bil UE support, 2 x 10 with RUE support only ?Manual Therapy: ?AP tibiofemoral grade IV x 4  ?Patellar inferior mobs grade III & IV ?PROM knee flexion working into end range seated on EOB ?Neuromuscular re-ed: ?Gait training focusing on heel strike\ toe off, equalizing stride avoiding excessive step length with RLE, and utilizing reciprocal arm swing  ? ? ?[facility] Adult PT Treatment:                                                DATE: 02/08/2022 ?AROM L knee total arc  4- 100 A tx  ?AROM L knee flexion 107  degrees   P TX ? ?Therapeutic Exercise: ?Recumbent bike L 2 x 6 min lowering seat ever 2 min to promote knee flexion ROM ?Prone quad stretch with strap 3 x 30 sec PNF with contract relax  ?Prone hamstring curl 2 x 15 combined with hip extension 5# ?Deadlift with 15# Kettlbel touching down on 6 inch step. Compensation with R weight shift requiring tactile cues to reduce weight shifting ?Hex bar deadlift with 95# 2 x 10 - reduced compensation noted during exercise ?Eccentric hamstring curl 2 x 10 using black theraband.  ?Manual Therapy: ?PROM knee flexion working into end range seated on EOB ? ? ?[facility] Adult PT  Treatment:  DATE: 02/07/2022 ?AROM L knee total arc 5  - 101 A tx  ?AROM L knee flexion 106 degrees     P TX ? ?Therapeutic Exercise: ?Quad stretch 2 x 30 sec in thomas test position ?Recumbent bike L 2 x 6 min lowering seat ever 2 min to promote knee flexion ROM ?Standing functional squat holding onto freemotion 2 x 12 ?Hamstring curl 2 x 15 20#, con bil/ ecc LLE  ?Standing knee flexion with foot in chair leaning forward 3 x 30 sec ?Step down with 6 inch step on LLE 2 x 15 (cues to avoid trunk lean,  ?Quadruped on the table rocking backward holding 2-3 seconds ea. ?Manual Therapy: ?PROM knee flexion working into end range seated on EOB with distraction - cues to avoid weight shifting to the R  ? ?Education details:  reviewed gait biomechanics and forming a good habit to stop when he notices he is limping or overutilizing his RLE and reset utilizing the proper form with heel strike/ toe off.  ?Person educated: Patient ?Education method: Explanation, Demonstration, Tactile cues, Verbal cues, and Handouts ?Education comprehension: verbalized understanding, returned demonstration, verbal cues required, tactile cues required, and needs further education ?  ?  ?HOME EXERCISE PROGRAM: ?Access Code: [email or web address] ?URL: [email or web address] ?Date: 11/13/2021 ?Prepared by: Ronny Flurry ?  ? ?  ?  ?ASSESSMENT: ?  ?CLINICAL IMPRESSION: ?Pt arrives to session denying pain but continues to report stiffness/ swelling located in the anterior aspect of the L knee. Continued working on knee ROM via mobs, patellar mobs, load load long durationg knee flexion in sitting on EOB. Continued working on gross LE strengthening which he does well with but does compensate by shifting to the R requiring both verbal and visual cues for proper form worked on gait training with emphasis on equalizing stride bil and utilize heel strike as well as reciprocal arm swing. End of session he incresed  his knee flexion from 97 to 105 but continues to demonstrate guarding secondary to pain/ swelling noted in the knee.  ? ?Objective impairments include Abnormal gait, decreased activity tolerance, decreased bal

## 2022-02-14 ENCOUNTER — Ambulatory Visit: Payer: No Typology Code available for payment source | Admitting: Physical Therapy

## 2022-02-14 DIAGNOSIS — M25562 Pain in left knee: Secondary | ICD-10-CM

## 2022-02-14 DIAGNOSIS — M25662 Stiffness of left knee, not elsewhere classified: Secondary | ICD-10-CM

## 2022-02-14 DIAGNOSIS — M6281 Muscle weakness (generalized): Secondary | ICD-10-CM

## 2022-02-14 NOTE — Therapy (Signed)
?OUTPATIENT PHYSICAL THERAPY TREATMENT NOTE ? ?Patient Name: Dustin Parks ?MRN: [id number] ?DOB:21-Mar-2001, 21 y.o., male ?Today's Date: 02/14/2022 ? ?PCP: Haydee Salter, MD ?REFERRING PROVIDER: Dr Donivan Scull  ? ? PT End of Session - 02/14/22 1510   ? ? Visit Number 25   ? Number of Visits 32   ? Date for PT Re-Evaluation 03/16/22   ? Authorization Type MCE   ? PT Start Time 1937   ? PT Stop Time 1546   ? PT Time Calculation (min) 42 min   ? Activity Tolerance Patient tolerated treatment well   ? Behavior During Therapy [facility] for tasks assessed/performed   ? ?  ?  ? ?  ? ? ? ? ? ? ? ? ? ? ? ? ? ? ? ? ? ?Past Medical History:  ?Diagnosis Date  ? Allergy   ? Asthma   ? out grown now   ? ?Past Surgical History:  ?Procedure Laterality Date  ? KNEE ARTHROSCOPY Left 02/02/2022  ? Procedure: LEFT ARTHROSCOPY KNEE LYSIS OF ADHESION AND MANIPULATION UNDER ANESTHESIA;  Surgeon: [name], MD;  Location: [place];  Service: Orthopedics;  Laterality: Left;  ? KNEE ARTHROSCOPY WITH ANTERIOR CRUCIATE LIGAMENT (ACL) REPAIR WITH HAMSTRING GRAFT Left 11/08/2021  ? Procedure: LEFT KNEE ANTERIOR CRUCIATE LIGAMENT RECONSTRUCTION WITH QUADRICEPS TENDON AUTOGRAFT;  Surgeon: [name], MD;  Location: [place];  Service: Orthopedics;  Laterality: Left;  ? KNEE ARTHROSCOPY WITH MENISCAL REPAIR  11/08/2021  ? Procedure: LEFT LATERAL MENISCAL REPAIR;  Surgeon: [name], MD;  Location: [place];  Service: Orthopedics;;  ? [place] EXTRACTION  2022  ? ?Patient Active Problem List  ? Diagnosis Date Noted  ? Peripheral tear of lateral meniscus of left knee as current injury   ? Arthrofibrosis of knee joint, left 01/31/2022  ? S/P repair of anterior cruciate ligament 01/31/2022  ? Morbid obesity with BMI of 40.0-44.9, adult ([place]) 01/31/2022  ? Hypertriglyceridemia 01/31/2022  ? Angioedema of lips- [place] 12/13/2021  ? Complex tear of lateral meniscus of left knee as  current injury   ? History of orchitis 02/08/2021  ? ?  ?PCP: Libby Maw, MD ?  ?REFERRING PROVIDER: [name], MD ?  ?REFERRING DIAG: T02.409B (ICD-10-CM) - Rupture of anterior cruciate ligament of left knee, initial encounter  ?     D53.299M (ICD-10-CM) - Complex tear of lateral meniscus of left knee as current injury, initial encounter      Z98.890 (ICD-10-CM) - S/P left knee arthroscopy  ?  ?THERAPY DIAG:  ?Left knee pain, unspecified chronicity ?  ?Stiffness of left knee, not elsewhere classified ?  ?Muscle weakness (generalized) ?  ?Difficulty in walking, not elsewhere classified ?  ?ONSET DATE: Injury 10/09/2021 / Surgery 11/08/2021 ?  ?SUBJECTIVE:  ?  ?SUBJECTIVE STATEMENT: ?"I worked my hamstring doing those bending exercises and my hamstring string is sore today. I did have some swelling this morning but after doing my wall slides exercises it really helped." ? ?PERTINENT HISTORY: ?11/08/2021 Left knee ACL reconstruction with quadriceps autograft and lateral meniscal repair.   ? ?02/02/2022: Lt knee lysis of adheresion and manipulation under anesthesia ? ?Allergy to certain adhesives including tegaderm per pt report  ?  ?PAIN:  ?Are you having pain? No ?NPRS scale: 1/10 at rest,  ?Pain location: right lateral knee  ?Pain orientation: Right  ?PAIN TYPE: aching ?Pain description: intermittent  ?Aggravating factors: use of the knee / falling off the  bed  ?Relieving factors: rest   ?  ?PRECAUTIONS: Other: per Dr. Eddie Dibbles ACL reconstruction with meniscal repair ?  ?WEIGHT BEARING RESTRICTIONS Yes  ?  ?  ?OCCUPATION: in culinary school   ?  ?PLOF: Independent; Pt was able to perform all of his ADLs/IADLs and functional mobility skills without difficulty or limitations.  Pt was able to perform work activities.  Pt ambulated without an AD with good stability without difficulty.  ?  ?PATIENT GOALS improved stability with leaning, to have the ability to run, to be able to bowl.  Return to PLOF.  ?  ?   ?OBJECTIVE:  ? ? ? ?LE ROM: ? ?A/PROM Right ? Left ?02/03/2022  ?Knee flexion  93 AROM/ 97 AAROM/ 103PROM/ 106 PROM following MET  ?Knee extension  -1 AROM/ 0 PROM  ? (Blank rows = not tested) ? ?LE MMT: ? ?MMT Right ? Left ?  ?Hip flexion    ?Hip extension    ?Hip abduction    ?Knee flexion    ?Knee extension    ?Ankle dorsiflexion    ?Ankle plantarflexion    ?Ankle inversion    ?Ankle eversion    ? (Blank rows = not tested) ? ? FUNCTIONAL TESTS: ? -Dead lift: Good form and depth with no pain ?  ?TODAY'S TREATMENT: ? ?[facility] Adult PT Treatment:                                                DATE: 02/14/2022 ?AROM L knee total arc 103 A tx  ?AROM L knee flexion 106 degrees   P TX ? ?Therapeutic Exercise: ?Recumbent bike L3 x 6 min lowering seat every 2 min to promote knee flexion ?Prone quad stretch 3 x 30 sec contract/ relax ?Hamstring stretch 2 x 30 sec ?Leg press con bil/ ecc LLE 2 x 15 with 40#,  ?Manual Therapy: ?MTPR along the along the bicep femoris an ?KT basket taping for swelling along the knee ? ?[facility] Adult PT Treatment:                                                DATE: 02/12/2022 ?AROM L knee total arc  3 - 97 A tx  ?AROM L knee flexion 105  degrees   P TX ? ?Therapeutic Exercise: ?PNF contract/ relax quad stretch x 3 holding contraction 10 sec ?Recumbent bike x 6 min lowering seat every 2 min to promote knee flexion ?Functional squat 1 x 20 holding on to free motion ?Standing hamstring curls 2 x 15 4# ?Lunge 1 x 10 touching LLE down onto bosu in // with bil UE support, 2 x 10 with RUE support only ?Manual Therapy: ?AP tibiofemoral grade IV x 4  ?Patellar inferior mobs grade III & IV ?PROM knee flexion working into end range seated on EOB ?Neuromuscular re-ed: ?Gait training focusing on heel strike\ toe off, equalizing stride avoiding excessive step length with RLE, and utilizing reciprocal arm swing  ? ? ?[facility] Adult PT Treatment:  DATE: 02/08/2022 ?AROM L knee  total arc  4- 100 A tx  ?AROM L knee flexion 107  degrees   P TX ? ?Therapeutic Exercise: ?Recumbent bike L 2 x 6 min lowering seat ever 2 min to promote knee flexion ROM ?Prone quad stretch with strap 3 x 30 sec PNF with contract relax  ?Prone hamstring curl 2 x 15 combined with hip extension 5# ?Deadlift with 15# Kettlbel touching down on 6 inch step. Compensation with R weight shift requiring tactile cues to reduce weight shifting ?Hex bar deadlift with 95# 2 x 10 - reduced compensation noted during exercise ?Eccentric hamstring curl 2 x 10 using black theraband.  ?Manual Therapy: ?PROM knee flexion working into end range seated on EOB ? ? ? ?Education details:Benefits of KT taping and if it causes any skin irritation to gently remove the tape while in the shower.  ?Person educated: Patient ?Education method: Explanation, Demonstration, Tactile cues, Verbal cues, and Handouts ?Education comprehension: verbalized understanding, returned demonstration, verbal cues required, tactile cues required, and needs further education ?  ?  ?HOME EXERCISE PROGRAM: ?Access Code: [email or web address] ?URL: [email or web address] ?Date: 11/13/2021 ?Prepared by: Ronny Flurry ?  ? ?  ?  ?ASSESSMENT: ?  ?CLINICAL IMPRESSION: ?Lorn reports he is consistent with his exercises at home and reports 1/10 pain at rest. He continues to demonstrate limited knee flexion secondary to swelling/ guarding of the knee.  beginning session at 103 and ending session at 106 following session. Trialed basket KT taping for edema avoiding his stitches from his manipulation. Continued working on knee ROM and strengthening using leg press with sled lowered to maximize flexion. He returns to the MD to assess his progress following his PT visit on Friday.  ? ?Objective impairments include Abnormal gait, decreased activity tolerance, decreased balance, decreased mobility, difficulty walking, decreased ROM, decreased strength, hypomobility, increased  edema, impaired flexibility, and pain. These impairments are limiting patient from cleaning, community activity, driving, meal prep, occupation, laundry, shopping, school, and ambulation . ?  ?  ?[facility]

## 2022-02-15 NOTE — Therapy (Signed)
?OUTPATIENT PHYSICAL THERAPY TREATMENT NOTE ? ?Patient Name: Dustin Parks ?MRN: [id number] ?DOB:2001/02/23, 21 y.o., male ?Today's Date: 02/16/2022 ? ?PCP: Haydee Salter, MD ?REFERRING PROVIDER: Dr Donivan Scull  ? ? PT End of Session - 02/16/22 1214   ? ? Visit Number 26   ? Number of Visits 32   ? Date for PT Re-Evaluation 03/16/22   ? Authorization Type MCE   ? PT Start Time 0938   ? PT Stop Time 1315   15 minutes vasopneumatic treatment  ? PT Time Calculation (min) 60 min   ? Activity Tolerance Patient tolerated treatment well   ? Behavior During Therapy [facility] for tasks assessed/performed   ? ?  ?  ? ?  ? ? ? ? ? ? ? ? ? ? ? ? ? ? ? ? ? ? ?Past Medical History:  ?Diagnosis Date  ? Allergy   ? Asthma   ? out grown now   ? ?Past Surgical History:  ?Procedure Laterality Date  ? KNEE ARTHROSCOPY Left 02/02/2022  ? Procedure: LEFT ARTHROSCOPY KNEE LYSIS OF ADHESION AND MANIPULATION UNDER ANESTHESIA;  Surgeon: [name], MD;  Location: [place];  Service: Orthopedics;  Laterality: Left;  ? KNEE ARTHROSCOPY WITH ANTERIOR CRUCIATE LIGAMENT (ACL) REPAIR WITH HAMSTRING GRAFT Left 11/08/2021  ? Procedure: LEFT KNEE ANTERIOR CRUCIATE LIGAMENT RECONSTRUCTION WITH QUADRICEPS TENDON AUTOGRAFT;  Surgeon: [name], MD;  Location: [place];  Service: Orthopedics;  Laterality: Left;  ? KNEE ARTHROSCOPY WITH MENISCAL REPAIR  11/08/2021  ? Procedure: LEFT LATERAL MENISCAL REPAIR;  Surgeon: [name], MD;  Location: [place];  Service: Orthopedics;;  ? [place] EXTRACTION  2022  ? ?Patient Active Problem List  ? Diagnosis Date Noted  ? Peripheral tear of lateral meniscus of left knee as current injury   ? Arthrofibrosis of knee joint, left 01/31/2022  ? S/P repair of anterior cruciate ligament 01/31/2022  ? Morbid obesity with BMI of 40.0-44.9, adult ([place]) 01/31/2022  ? Hypertriglyceridemia 01/31/2022  ? Angioedema of lips- [place] 12/13/2021  ? Complex tear of  lateral meniscus of left knee as current injury   ? History of orchitis 02/08/2021  ? ?  ?PCP: Libby Maw, MD ?  ?REFERRING PROVIDER: [name], MD ?  ?REFERRING DIAG: H82.993Z (ICD-10-CM) - Rupture of anterior cruciate ligament of left knee, initial encounter  ?     J69.678L (ICD-10-CM) - Complex tear of lateral meniscus of left knee as current injury, initial encounter      Z98.890 (ICD-10-CM) - S/P left knee arthroscopy  ?  ?THERAPY DIAG:  ?Left knee pain, unspecified chronicity ?  ?Stiffness of left knee, not elsewhere classified ?  ?Muscle weakness (generalized) ?  ?Difficulty in walking, not elsewhere classified ?  ?ONSET DATE: Injury 10/09/2021 / Surgery 11/08/2021 ?  ?SUBJECTIVE:  ?  ?SUBJECTIVE STATEMENT: ?Pt reports continued high levels of swelling about his Lt knee, although he denies any pain currently. He reports only having pain with bending. He reports doing his HEP regularly. ? ?PERTINENT HISTORY: ?11/08/2021 Left knee ACL reconstruction with quadriceps autograft and lateral meniscal repair.   ? ?02/02/2022: Lt knee lysis of adheresion and manipulation under anesthesia ? ?Allergy to certain adhesives including tegaderm per pt report  ?  ?PAIN:  ?Are you having pain? No ?NPRS scale: 1/10 at rest,  ?Pain location: right lateral knee  ?Pain orientation: Right  ?PAIN TYPE: aching ?Pain description: intermittent  ?Aggravating factors: use of the knee /  falling off the bed  ?Relieving factors: rest   ?  ?PRECAUTIONS: Other: per Dr. Eddie Dibbles ACL reconstruction with meniscal repair ?  ?WEIGHT BEARING RESTRICTIONS Yes  ?  ?  ?OCCUPATION: in culinary school   ?  ?PLOF: Independent; Pt was able to perform all of his ADLs/IADLs and functional mobility skills without difficulty or limitations.  Pt was able to perform work activities.  Pt ambulated without an AD with good stability without difficulty.  ?  ?PATIENT GOALS improved stability with leaning, to have the ability to run, to be able to bowl.   Return to PLOF.  ?  ?  ?OBJECTIVE:  ?*Unless otherwise noted, objective measures collected previously* ? ? ?LE ROM: ? ?A/PROM Right ? Left ?02/03/2022 Left ?02/16/2022  ?Knee flexion  93 AROM/ 97 AAROM/ 103PROM/ 106 PROM following MET 96 AROM, 102 PROM, 111 following MET  ?Knee extension  -1 AROM/ 0 PROM 2 AROM, 4 PROM  ? (Blank rows = not tested) ? ?LE MMT: ? ?MMT Right ? Left ?  ?Hip flexion  4+/5  ?Hip extension    ?Hip abduction    ?Knee flexion  5/5  ?Knee extension  4+/5  ?Ankle dorsiflexion  5/5  ?Ankle plantarflexion  5/5  ?Ankle inversion    ?Ankle eversion    ? (Blank rows = not tested) ? ? FUNCTIONAL TESTS: ? -Dead lift: Good form and depth with no pain ?  ?TODAY'S TREATMENT: ? ? [place] Adult PT Treatment:                                                DATE: 02/16/2022 ?Therapeutic Exercise: ?Recumbent bike L3 x 6 min lowering seat every 2 min to promote knee flexion ?Prone knee hang with 6# ankle weights 4x30sec ?Prone hamstring curls with 6# ankle weights 3x10 ?4-inch lateral heel taps 3x10 with 10# kettlebell on Lt ?Manual Therapy: ?KT proximal edema split-taping along the knee ?PROM into flexion x3 with 30sec hold at end range ?Knee flexion contract/ counter-contract MET x3 with 30-sec hold at end range ?Neuromuscular re-ed: ?N/A ?Therapeutic Activity: ?N/A ?Modalities: ?15 minutes Game Ready vasopneumatic treatment at 34 degrees F, x10 minutes in supine with Lt LE elevated ?Self Care: ?N/A ? ? ?[place] Adult PT Treatment:                                                DATE: 02/14/2022 ?AROM L knee total arc 103 A tx  ?AROM L knee flexion 106 degrees   P TX ? ?Therapeutic Exercise: ?Recumbent bike L3 x 6 min lowering seat every 2 min to promote knee flexion ?Prone quad stretch 3 x 30 sec contract/ relax ?Hamstring stretch 2 x 30 sec ?Leg press con bil/ ecc LLE 2 x 15 with 40#,  ?Manual Therapy: ?MTPR along the along the bicep femoris an ?KT basket taping for swelling along the knee ? ?[facility] Adult PT Treatment:                                                 DATE: 02/12/2022 ?AROM L knee  total arc  3 - 97 A tx  ?AROM L knee flexion 105  degrees   P TX ? ?Therapeutic Exercise: ?PNF contract/ relax quad stretch x 3 holding contraction 10 sec ?Recumbent bike x 6 min lowering seat every 2 min to promote knee flexion ?Functional squat 1 x 20 holding on to free motion ?Standing hamstring curls 2 x 15 4# ?Lunge 1 x 10 touching LLE down onto bosu in // with bil UE support, 2 x 10 with RUE support only ?Manual Therapy: ?AP tibiofemoral grade IV x 4  ?Patellar inferior mobs grade III & IV ?PROM knee flexion working into end range seated on EOB ?Neuromuscular re-ed: ?Gait training focusing on heel strike\ toe off, equalizing stride avoiding excessive step length with RLE, and utilizing reciprocal arm swing  ? ? ? ? ?Education details: Benefits of KT taping and if it causes any skin irritation to gently remove the tape while in the shower.  ?Person educated: Patient ?Education method: Explanation, Demonstration, Tactile cues, Verbal cues, and Handouts ?Education comprehension: verbalized understanding, returned demonstration, verbal cues required, tactile cues required, and needs further education ?  ?  ?HOME EXERCISE PROGRAM: ?Access Code: [email or web address] ?URL: [email or web address] ?Date: 11/13/2021 ?Prepared by: Ronny Flurry ?  ? ?  ?  ?ASSESSMENT: ?  ?CLINICAL IMPRESSION: ?Pt continues to demonstrate limited knee flexion AROM, although he saw a 9-degree improvement of knee flexion PROM in-session following contract/ co-contract MET. His knee extension is WNL at this point and he only has mild quad weakness at this point. He tolerated all interventions well today, although manual techniques were uncomfortable during the treatment. He will continue to benefit from skilled PT to address his primary impairments and return to his prior level of function with less limitation. ? ?Objective impairments include Abnormal gait, decreased  activity tolerance, decreased balance, decreased mobility, difficulty walking, decreased ROM, decreased strength, hypomobility, increased edema, impaired flexibility, and pain. These impairments a

## 2022-02-16 ENCOUNTER — Ambulatory Visit (INDEPENDENT_AMBULATORY_CARE_PROVIDER_SITE_OTHER): Payer: No Typology Code available for payment source | Admitting: Orthopaedic Surgery

## 2022-02-16 ENCOUNTER — Ambulatory Visit: Payer: No Typology Code available for payment source

## 2022-02-16 DIAGNOSIS — M6281 Muscle weakness (generalized): Secondary | ICD-10-CM

## 2022-02-16 DIAGNOSIS — M25662 Stiffness of left knee, not elsewhere classified: Secondary | ICD-10-CM

## 2022-02-16 DIAGNOSIS — M24662 Ankylosis, left knee: Secondary | ICD-10-CM

## 2022-02-16 DIAGNOSIS — M25562 Pain in left knee: Secondary | ICD-10-CM

## 2022-02-16 NOTE — Progress Notes (Signed)
? ?                            ? ? ?Chief Complaint: Postop follow-up ?  ? ?Left knee ACL reconstruction with quadriceps tendon autograft and lateral meniscal repair done January 4th, 2023 with manipulation under anesthesia and lysis of adhesions on 02/02/22 ? ?History of Present Illness:  ? ?02/16/2022: Presents today for follow-up status post the above procedure.  Overall he is doing much better after the manipulation.  He has been working with physical therapy multiple times weekly.  Has been able to get 211 degrees without difficulty.  Pain is very well controlled.  Overall he is feeling much stronger and better. ? ?Surgical History:   ?None ? ?PMH/PSH/Family History/Social History/Meds/Allergies:   ? ?Past Medical History:  ?Diagnosis Date  ? Allergy   ? Asthma   ? out grown now   ? ?Past Surgical History:  ?Procedure Laterality Date  ? KNEE ARTHROSCOPY Left 02/02/2022  ? Procedure: LEFT ARTHROSCOPY KNEE LYSIS OF ADHESION AND MANIPULATION UNDER ANESTHESIA;  Surgeon: Huel Cote, MD;  Location: [facility];  Service: Orthopedics;  Laterality: Left;  ? KNEE ARTHROSCOPY WITH ANTERIOR CRUCIATE LIGAMENT (ACL) REPAIR WITH HAMSTRING GRAFT Left 11/08/2021  ? Procedure: LEFT KNEE ANTERIOR CRUCIATE LIGAMENT RECONSTRUCTION WITH QUADRICEPS TENDON AUTOGRAFT;  Surgeon: Huel Cote, MD;  Location: [facility];  Service: Orthopedics;  Laterality: Left;  ? KNEE ARTHROSCOPY WITH MENISCAL REPAIR  11/08/2021  ? Procedure: LEFT LATERAL MENISCAL REPAIR;  Surgeon: Huel Cote, MD;  Location: [facility];  Service: Orthopedics;;  ? [place] TO[place] EXTRACTION  2022  ? ?Social History  ? ?Socioeconomic History  ? Marital status: Single  ?  Spouse name: Not on file  ? Number of children: 1  ? Years of education: Not on file  ? Highest education level: Not on file  ?Occupational History  ? Occupation: Consulting civil engineer  ?  Comment: [employer]  ?Tobacco Use  ? Smoking status: Never  ? Smokeless tobacco: Never   ?Vaping Use  ? Vaping Use: Never used  ?Substance and Sexual Activity  ? Alcohol use: Not Currently  ?  Comment: rare  ? Drug use: Yes  ?  Types: Marijuana  ?  Comment: [employer]  ? Sexual activity: Yes  ?Other Topics Concern  ? Not on file  ?Social History Narrative  ? Not on file  ? ?Social Determinants of Health  ? ?Financial Resource Strain: Not on file  ?Food Insecurity: Not on file  ?Transportation Needs: Not on file  ?Physical Activity: Not on file  ?Stress: Not on file  ?Social Connections: Not on file  ? ?Family History  ?Problem Relation Age of Onset  ? Obesity Mother   ? Hyperlipidemia Father   ? Obesity Father   ? Obesity Maternal Grandmother   ? Stroke Maternal Grandfather   ? Diabetes Paternal Grandmother   ? ?Allergies  ?Allergen Reactions  ? Other Swelling  ?  All citrus fruit with the exception of lime  ? ?Current Outpatient Medications  ?Medication Sig Dispense Refill  ? EPINEPHrine 0.3 mg/0.3 mL IJ SOAJ injection Inject 0.3 mg into the muscle as needed for anaphylaxis. 2 each 1  ? oxyCODONE (OXY IR/ROXICODONE) 5 MG immediate release tablet Take 1 tablet (5 mg total) by mouth every 4 (four) hours as needed (severe pain). 20 tablet 0  ? ?No current facility-administered medications for this visit.  ? ?  No results found. ? ?Review of Systems:   ?A ROS was performed including pertinent positives and negatives as documented in the HPI. ? ?Physical Exam :   ?Constitutional: NAD and appears stated age ?Neurological: Alert and oriented ?Psych: Appropriate affect and cooperative ?There were no vitals taken for this visit.  ? ?Comprehensive Musculoskeletal Exam:   ?  ?Incisions are well-healing.  Range of motion is from 0 to 115 degrees.  There is negative Lachman decreased quadriceps bulk although there is good tone.  No joint line tenderness. ?Imaging:   ? ?I personally reviewed and interpreted the radiographs. ? ? ?Assessment:   ?21 year old male status post ACL reconstruction with quadriceps tendon  autograft as well as lateral meniscal tear repair.  He subsequently required a manipulation under anesthesia and lysis of adhesions.  He is doing much better following this.  I would like him to continue to work on aggressive physical therapy.  I will plan to see him back in 2 months ?Plan :   ? ?-Return to clinic in 2 months ? ? ? ? ? ? ?I personally saw and evaluated the patient, and participated in the management and treatment plan. ? ?Huel Cote, MD ?Attending Physician, Orthopedic Surgery ? ?This document was dictated using Conservation officer, historic buildings. A reasonable attempt at proof reading has been made to minimize errors. ? ?

## 2022-02-19 ENCOUNTER — Ambulatory Visit: Payer: No Typology Code available for payment source | Admitting: Physical Therapy

## 2022-02-19 ENCOUNTER — Encounter: Payer: Self-pay | Admitting: Physical Therapy

## 2022-02-19 DIAGNOSIS — M25562 Pain in left knee: Secondary | ICD-10-CM

## 2022-02-19 DIAGNOSIS — M6281 Muscle weakness (generalized): Secondary | ICD-10-CM

## 2022-02-19 DIAGNOSIS — M25662 Stiffness of left knee, not elsewhere classified: Secondary | ICD-10-CM

## 2022-02-19 NOTE — Therapy (Signed)
?OUTPATIENT PHYSICAL THERAPY TREATMENT NOTE ? ?Patient Name: Dustin Parks ?MRN: [id number] ?DOB:[birth year], 21 y.o., male ?Today's Date: 02/19/2022 ? ?PCP: Haydee Salter, MD ?REFERRING PROVIDER: Dr Donivan Scull  ? ? PT End of Session - 02/19/22 0801   ? ? Visit Number 27   ? Number of Visits 32   ? Date for PT Re-Evaluation 03/16/22   ? Authorization Type MCE   ? Authorization - Number of Visits 12   ? PT Start Time 0801   ? PT Stop Time 0845   ? PT Time Calculation (min) 44 min   ? Activity Tolerance Patient tolerated treatment well   ? Behavior During Therapy [facility] for tasks assessed/performed   ? ?  ?  ? ?  ? ? ? ? ? ? ? ? ? ? ? ? ? ? ? ? ? ? ? ?Past Medical History:  ?Diagnosis Date  ? Allergy   ? Asthma   ? out grown now   ? ?Past Surgical History:  ?Procedure Laterality Date  ? KNEE ARTHROSCOPY Left 02/02/2022  ? Procedure: LEFT ARTHROSCOPY KNEE LYSIS OF ADHESION AND MANIPULATION UNDER ANESTHESIA;  Surgeon: [name], MD;  Location: [place];  Service: Orthopedics;  Laterality: Left;  ? KNEE ARTHROSCOPY WITH ANTERIOR CRUCIATE LIGAMENT (ACL) REPAIR WITH HAMSTRING GRAFT Left 11/08/2021  ? Procedure: LEFT KNEE ANTERIOR CRUCIATE LIGAMENT RECONSTRUCTION WITH QUADRICEPS TENDON AUTOGRAFT;  Surgeon: [name], MD;  Location: [place];  Service: Orthopedics;  Laterality: Left;  ? KNEE ARTHROSCOPY WITH MENISCAL REPAIR  11/08/2021  ? Procedure: LEFT LATERAL MENISCAL REPAIR;  Surgeon: [name], MD;  Location: [place];  Service: Orthopedics;;  ? [place] EXTRACTION  2022  ? ?Patient Active Problem List  ? Diagnosis Date Noted  ? Peripheral tear of lateral meniscus of left knee as current injury   ? Arthrofibrosis of knee joint, left 01/31/2022  ? S/P repair of anterior cruciate ligament 01/31/2022  ? Morbid obesity with BMI of 40.0-44.9, adult ([place]) 01/31/2022  ? Hypertriglyceridemia 01/31/2022  ? Angioedema of lips- [place] 12/13/2021  ? Complex  tear of lateral meniscus of left knee as current injury   ? History of orchitis 02/08/2021  ? ?  ?PCP: Libby Maw, MD ?  ?REFERRING PROVIDER: [name], MD ?  ?REFERRING DIAG: B09.628Z (ICD-10-CM) - Rupture of anterior cruciate ligament of left knee, initial encounter  ?     M62.947M (ICD-10-CM) - Complex tear of lateral meniscus of left knee as current injury, initial encounter      Z98.890 (ICD-10-CM) - S/P left knee arthroscopy  ?  ?THERAPY DIAG:  ?Left knee pain, unspecified chronicity ?  ?Stiffness of left knee, not elsewhere classified ?  ?Muscle weakness (generalized) ?  ?Difficulty in walking, not elsewhere classified ?  ?ONSET DATE: Injury 10/09/2021 / Surgery 11/08/2021 ?  ?SUBJECTIVE:  ?  ?SUBJECTIVE STATEMENT: ?The MD stated that he is pleased with the ROM that since he broke the 111 marker that he should be able to make 120. "Today I am sore since I was in the car for about 4 hours yesterday." ? ?PERTINENT HISTORY: ?11/08/2021 Left knee ACL reconstruction with quadriceps autograft and lateral meniscal repair.   ? ?02/02/2022: Lt knee lysis of adheresion and manipulation under anesthesia ? ?Allergy to certain adhesives including tegaderm per pt report  ?  ?PAIN:  ?Are you having pain? No ?NPRS scale: 1/10 at rest,  ?Pain location: right lateral knee  ?Pain orientation:  Right  ?PAIN TYPE: aching ?Pain description: intermittent  ?Aggravating factors: use of the knee / falling off the bed  ?Relieving factors: rest   ?  ?PRECAUTIONS: Other: per Dr. Eddie Dibbles ACL reconstruction with meniscal repair ?  ?WEIGHT BEARING RESTRICTIONS Yes  ?  ?  ?OCCUPATION: in culinary school   ?  ?PLOF: Independent; Pt was able to perform all of his ADLs/IADLs and functional mobility skills without difficulty or limitations.  Pt was able to perform work activities.  Pt ambulated without an AD with good stability without difficulty.  ?  ?PATIENT GOALS improved stability with leaning, to have the ability to run, to be  able to bowl.  Return to PLOF.  ?  ?  ?OBJECTIVE:  ?*Unless otherwise noted, objective measures collected previously* ? ? ?LE ROM: ? ?A/PROM Right ? Left ?02/03/2022 Left ?02/16/2022  ?Knee flexion  93 AROM/ 97 AAROM/ 103PROM/ 106 PROM following MET 96 AROM, 102 PROM, 111 following MET  ?Knee extension  -1 AROM/ 0 PROM 2 AROM, 4 PROM  ? (Blank rows = not tested) ? ?LE MMT: ? ?MMT Right ? Left ?  ?Hip flexion  4+/5  ?Hip extension    ?Hip abduction    ?Knee flexion  5/5  ?Knee extension  4+/5  ?Ankle dorsiflexion  5/5  ?Ankle plantarflexion  5/5  ?Ankle inversion    ?Ankle eversion    ? (Blank rows = not tested) ? ? FUNCTIONAL TESTS: ? -Dead lift: Good form and depth with no pain ?  ?TODAY'S TREATMENT: ? ? [place] Adult PT Treatment:                                                DATE: 02/19/2022 ?115 degrees of flexion  ? ?Therapeutic Exercise: ?Recumbent bike L3 x 6 min lowering seat every 2 min to promote knee flexion ?PNF stretch 3 x 30 second in thomas test position ?Bridge with RLE advanced 6 in with  LLE blocked to prevent knee extension and maintain flexion  x 15 reps, performed 1 more time with RLE advanced an additional 5 inch. Last set performed 1 set of 15 with RLE extended.  ?Standing hip flexor stretching with RTB 2 x 20 ?Standing hamstring curl 2 x 15 with RTB ?Standing hip abduction 2 x 20 with RTB ?Manual Therapy: ?KT proximal edema split-taping along the knee ?AP grade IV with active flexion between sets  ?Neuromuscular re-ed: ?In // SLS LLE 1 x 30 sec EO, 2 x 30 sec EC with max postural sway ?In // tandem 4 x 30 sec EC with mod posturals way ?Therapeutic Activity: ?Pre-jumping squat into- heel raise into sudden squat 3 x 10 using visual cues to reduce lateral weight shifting.  ? ? ?[facility] Adult PT Treatment:                                                DATE: 02/16/2022 ?Therapeutic Exercise: ?Recumbent bike L3 x 6 min lowering seat every 2 min to promote knee flexion ?Prone knee hang with 6# ankle weights  4x30sec ?Prone hamstring curls with 6# ankle weights 3x10 ?4-inch lateral heel taps 3x10 with 10# kettlebell on Lt ?Manual Therapy: ?KT proximal edema split-taping along the knee ?  PROM into flexion x3 with 30sec hold at end range ?Knee flexion contract/ counter-contract MET x3 with 30-sec hold at end range ?Neuromuscular re-ed: ?N/A ?Therapeutic Activity: ?N/A ?Modalities: ?15 minutes Game Ready vasopneumatic treatment at 34 degrees F, x10 minutes in supine with Lt LE elevated ?Self Care: ?N/A ? ? ?[place] Adult PT Treatment:                                                DATE: 02/14/2022 ?AROM L knee total arc 103 A tx  ?AROM L knee flexion 106 degrees   P TX ? ?Therapeutic Exercise: ?Recumbent bike L3 x 6 min lowering seat every 2 min to promote knee flexion ?Prone quad stretch 3 x 30 sec contract/ relax ?Hamstring stretch 2 x 30 sec ?Leg press con bil/ ecc LLE 2 x 15 with 40#,  ?Manual Therapy: ?MTPR along the along the bicep femoris an ?KT basket taping for swelling along the knee ? ? ? ? ?Education details: Benefits of KT taping and if it causes any skin irritation to gently remove the tape while in the shower.  ?Person educated: Patient ?Education method: Explanation, Demonstration, Tactile cues, Verbal cues, and Handouts ?Education comprehension: verbalized understanding, returned demonstration, verbal cues required, tactile cues required, and needs further education ?  ?  ?HOME EXERCISE PROGRAM: ?Access Code: [email or web address] ?URL: [email or web address] ?Date: 11/13/2021 ?Prepared by: Ronny Flurry ?  ? ?  ?  ?ASSESSMENT: ?  ?CLINICAL IMPRESSION: ?Pt arrives to session reporting he saw the MD since the last session and was pleased with the progress he is making with PT. Continued working on knee ROM which following he increased his knee AAROM to 115 degrees. Continued working on gross hip/ knee strengthening in standing. He demonstrated difficulty with balance demonstrating postural sway. Began working on  pre-jumping activities today which he did require verbal/ and visual cues to avoid compensation.  ? ?Objective impairments include Abnormal gait, decreased activity tolerance, decreased balance, decrea

## 2022-02-21 ENCOUNTER — Encounter: Payer: Self-pay | Admitting: Physical Therapy

## 2022-02-22 ENCOUNTER — Encounter: Payer: Self-pay | Admitting: Physical Therapy

## 2022-02-23 NOTE — Therapy (Signed)
?OUTPATIENT PHYSICAL THERAPY TREATMENT NOTE ? ?Patient Name: Dustin Parks ?MRN: [id number] ?DOB:2001/08/20, 21 y.o., male ?Today's Date: 02/24/2022 ? ?PCP: Haydee Salter, MD ?REFERRING PROVIDER: Dr Donivan Scull  ? ? PT End of Session - 02/24/22 0815   ? ? Visit Number 28   ? Number of Visits 32   ? Date for PT Re-Evaluation 03/16/22   ? Authorization Type MCE   ? PT Start Time [phone]   Pt used the bathroom at the start of the session  ? PT Stop Time 0900   ? PT Time Calculation (min) 39 min   ? Activity Tolerance Patient tolerated treatment well   ? Behavior During Therapy [facility] for tasks assessed/performed   ? ?  ?  ? ?  ? ? ? ? ? ? ? ? ? ? ? ? ? ? ? ? ? ? ? ? ?Past Medical History:  ?Diagnosis Date  ? Allergy   ? Asthma   ? out grown now   ? ?Past Surgical History:  ?Procedure Laterality Date  ? KNEE ARTHROSCOPY Left 02/02/2022  ? Procedure: LEFT ARTHROSCOPY KNEE LYSIS OF ADHESION AND MANIPULATION UNDER ANESTHESIA;  Surgeon: [name], MD;  Location: [place];  Service: Orthopedics;  Laterality: Left;  ? KNEE ARTHROSCOPY WITH ANTERIOR CRUCIATE LIGAMENT (ACL) REPAIR WITH HAMSTRING GRAFT Left 11/08/2021  ? Procedure: LEFT KNEE ANTERIOR CRUCIATE LIGAMENT RECONSTRUCTION WITH QUADRICEPS TENDON AUTOGRAFT;  Surgeon: [name], MD;  Location: [place];  Service: Orthopedics;  Laterality: Left;  ? KNEE ARTHROSCOPY WITH MENISCAL REPAIR  11/08/2021  ? Procedure: LEFT LATERAL MENISCAL REPAIR;  Surgeon: [name], MD;  Location: [place];  Service: Orthopedics;;  ? [place] EXTRACTION  2022  ? ?Patient Active Problem List  ? Diagnosis Date Noted  ? Peripheral tear of lateral meniscus of left knee as current injury   ? Arthrofibrosis of knee joint, left 01/31/2022  ? S/P repair of anterior cruciate ligament 01/31/2022  ? Morbid obesity with BMI of 40.0-44.9, adult ([place]) 01/31/2022  ? Hypertriglyceridemia 01/31/2022  ? Angioedema of lips- [place] 12/13/2021   ? Complex tear of lateral meniscus of left knee as current injury   ? History of orchitis 02/08/2021  ? ?  ?PCP: Libby Maw, MD ?  ?REFERRING PROVIDER: [name], MD ?  ?REFERRING DIAG: M21.115Z (ICD-10-CM) - Rupture of anterior cruciate ligament of left knee, initial encounter  ?     M08.022V (ICD-10-CM) - Complex tear of lateral meniscus of left knee as current injury, initial encounter      Z98.890 (ICD-10-CM) - S/P left knee arthroscopy  ?  ?THERAPY DIAG:  ?Left knee pain, unspecified chronicity ?  ?Stiffness of left knee, not elsewhere classified ?  ?Muscle weakness (generalized) ?  ?Difficulty in walking, not elsewhere classified ?  ?ONSET DATE: Injury 10/09/2021 / Surgery 11/08/2021 ?  ?SUBJECTIVE:  ?  ?SUBJECTIVE STATEMENT: ?Pt reports that his new job has been going well, adding that he has been allowed to sit down when needed. He also reports HEP adherence since last visit. He also states he took extra pain medication this morning and is ready for aggressive stretching.  ? ?PERTINENT HISTORY: ?11/08/2021 Left knee ACL reconstruction with quadriceps autograft and lateral meniscal repair.   ? ?02/02/2022: Lt knee lysis of adheresion and manipulation under anesthesia ? ?Allergy to certain adhesives including tegaderm per pt report  ?  ?PAIN:  ?Are you having pain? No ?NPRS scale: 1/10 at rest,  ?  Pain location: right lateral knee  ?Pain orientation: Right  ?PAIN TYPE: aching ?Pain description: intermittent  ?Aggravating factors: use of the knee / falling off the bed  ?Relieving factors: rest   ?  ?PRECAUTIONS: Other: per Dr. Eddie Dibbles ACL reconstruction with meniscal repair ?  ?WEIGHT BEARING RESTRICTIONS Yes  ?  ?  ?OCCUPATION: in culinary school   ?  ?PLOF: Independent; Pt was able to perform all of his ADLs/IADLs and functional mobility skills without difficulty or limitations.  Pt was able to perform work activities.  Pt ambulated without an AD with good stability without difficulty.  ?   ?PATIENT GOALS improved stability with leaning, to have the ability to run, to be able to bowl.  Return to PLOF.  ?  ?  ?OBJECTIVE:  ?*Unless otherwise noted, objective measures collected previously* ? ? ?LE ROM: ? ?A/PROM Right ? Left ?02/03/2022 Left ?02/16/2022  ?Knee flexion  93 AROM/ 97 AAROM/ 103PROM/ 106 PROM following MET 96 AROM, 102 PROM, 111 following MET  ?Knee extension  -1 AROM/ 0 PROM 2 AROM, 4 PROM  ? (Blank rows = not tested) ? ?LE MMT: ? ?MMT Right ? Left ?  ?Hip flexion  4+/5  ?Hip extension    ?Hip abduction    ?Knee flexion  5/5  ?Knee extension  4+/5  ?Ankle dorsiflexion  5/5  ?Ankle plantarflexion  5/5  ?Ankle inversion    ?Ankle eversion    ? (Blank rows = not tested) ? ? FUNCTIONAL TESTS: ? -Dead lift: Good form and depth with no pain ?  ?TODAY'S TREATMENT: ? ?[place] Adult PT Treatment:                                                DATE: 02/24/2022 ?Therapeutic Exercise: ?Exercise bike x5 minutes while emphasizing greater knee excursion and while collecting subjective information ?Squat into heel raise and overhead reach with waist attachment and two 13# cables at Free Motion machine 3x10 ?Bridge with eccentric knee extension with sliders 2x10 ?Standing Cybex hip abduction with 25# 2x10 BIL ?4-inch lateral heel taps 2x10 on Lt ?Manual Therapy: ?PROM into flexion x5 with 30sec hold at end range ?Knee flexion contract/ counter-contract MET x5 with 30-sec hold at end range ?Neuromuscular re-ed: ?N/A ?Therapeutic Activity: ?N/A ?Modalities: ?N/A ?Self Care: ?N/A ? ? ? [place] Adult PT Treatment:                                                DATE: 02/19/2022 ?115 degrees of flexion  ? ?Therapeutic Exercise: ?Recumbent bike L3 x 6 min lowering seat every 2 min to promote knee flexion ?PNF stretch 3 x 30 second in thomas test position ?Bridge with RLE advanced 6 in with  LLE blocked to prevent knee extension and maintain flexion  x 15 reps, performed 1 more time with RLE advanced an additional 5 inch.  Last set performed 1 set of 15 with RLE extended.  ?Standing hip flexor stretching with RTB 2 x 20 ?Standing hamstring curl 2 x 15 with RTB ?Standing hip abduction 2 x 20 with RTB ?Manual Therapy: ?KT proximal edema split-taping along the knee ?AP grade IV with active flexion between sets  ?Neuromuscular re-ed: ?In // SLS  LLE 1 x 30 sec EO, 2 x 30 sec EC with max postural sway ?In // tandem 4 x 30 sec EC with mod posturals way ?Therapeutic Activity: ?Pre-jumping squat into- heel raise into sudden squat 3 x 10 using visual cues to reduce lateral weight shifting.  ? ? ?[facility] Adult PT Treatment:                                                DATE: 02/16/2022 ?Therapeutic Exercise: ?Recumbent bike L3 x 6 min lowering seat every 2 min to promote knee flexion ?Prone knee hang with 6# ankle weights 4x30sec ?Prone hamstring curls with 6# ankle weights 3x10 ?4-inch lateral heel taps 3x10 with 10# kettlebell on Lt ?Manual Therapy: ?KT proximal edema split-taping along the knee ?PROM into flexion x3 with 30sec hold at end range ?Knee flexion contract/ counter-contract MET x3 with 30-sec hold at end range ?Neuromuscular re-ed: ?N/A ?Therapeutic Activity: ?N/A ?Modalities: ?15 minutes [employer] vasopneumatic treatment at 34 degrees F, x10 minutes in supine with Lt LE elevated ?Self Care: ?N/A ? ? ? ? ? ?Education details: Benefits of KT taping and if it causes any skin irritation to gently remove the tape while in the shower.  ?Person educated: Patient ?Education method: Explanation, Demonstration, Tactile cues, Verbal cues, and Handouts ?Education comprehension: verbalized understanding, returned demonstration, verbal cues required, tactile cues required, and needs further education ?  ?  ?HOME EXERCISE PROGRAM: ?Access Code: [email or web address] ?URL: [email or web address] ?Date: 11/13/2021 ?Prepared by: Ronny Flurry ?  ? ?  ?  ?ASSESSMENT: ?  ?CLINICAL IMPRESSION: ?Pt responded well to all interventions today, demonstrating  good form and no pain with therapeutic exercises. He continues to have pain at end-range knee flexion with manual techniques. However, he was able to accomplish 112 degrees of flexion after MET. He will conti

## 2022-02-24 ENCOUNTER — Ambulatory Visit: Payer: No Typology Code available for payment source

## 2022-02-24 DIAGNOSIS — M6281 Muscle weakness (generalized): Secondary | ICD-10-CM

## 2022-02-24 DIAGNOSIS — M25562 Pain in left knee: Secondary | ICD-10-CM

## 2022-02-24 DIAGNOSIS — M25662 Stiffness of left knee, not elsewhere classified: Secondary | ICD-10-CM

## 2022-02-25 ENCOUNTER — Encounter (HOSPITAL_BASED_OUTPATIENT_CLINIC_OR_DEPARTMENT_OTHER): Payer: Self-pay | Admitting: Orthopaedic Surgery

## 2022-02-26 ENCOUNTER — Ambulatory Visit: Payer: No Typology Code available for payment source | Admitting: Physical Therapy

## 2022-02-26 ENCOUNTER — Encounter: Payer: Self-pay | Admitting: Physical Therapy

## 2022-02-26 DIAGNOSIS — M25662 Stiffness of left knee, not elsewhere classified: Secondary | ICD-10-CM

## 2022-02-26 DIAGNOSIS — M25562 Pain in left knee: Secondary | ICD-10-CM

## 2022-02-26 DIAGNOSIS — M6281 Muscle weakness (generalized): Secondary | ICD-10-CM

## 2022-02-26 NOTE — Therapy (Signed)
?OUTPATIENT PHYSICAL THERAPY TREATMENT NOTE ? ?Patient Name: Dustin Parks ?MRN: [id number] ?DOB:Jul 28, 2001, 21 y.o., male ?Today's Date: 02/26/2022 ? ?PCP: Haydee Salter, MD ?REFERRING PROVIDER: Dr Donivan Scull  ? ? PT End of Session - 02/26/22 6948   ? ? Visit Number 29   ? Number of Visits 32   ? Date for PT Re-Evaluation 03/16/22   ? Authorization Type MCE   ? PT Start Time [phone]   pt arrived 10 min late  ? PT Stop Time 7035   ? PT Time Calculation (min) 38 min   ? Activity Tolerance Patient tolerated treatment well   ? ?  ?  ? ?  ? ? ? ? ? ? ? ? ? ? ? ? ? ? ? ? ? ? ? ? ? ?Past Medical History:  ?Diagnosis Date  ? Allergy   ? Asthma   ? out grown now   ? ?Past Surgical History:  ?Procedure Laterality Date  ? KNEE ARTHROSCOPY Left 02/02/2022  ? Procedure: LEFT ARTHROSCOPY KNEE LYSIS OF ADHESION AND MANIPULATION UNDER ANESTHESIA;  Surgeon: [name], MD;  Location: [place];  Service: Orthopedics;  Laterality: Left;  ? KNEE ARTHROSCOPY WITH ANTERIOR CRUCIATE LIGAMENT (ACL) REPAIR WITH HAMSTRING GRAFT Left 11/08/2021  ? Procedure: LEFT KNEE ANTERIOR CRUCIATE LIGAMENT RECONSTRUCTION WITH QUADRICEPS TENDON AUTOGRAFT;  Surgeon: [name], MD;  Location: [place];  Service: Orthopedics;  Laterality: Left;  ? KNEE ARTHROSCOPY WITH MENISCAL REPAIR  11/08/2021  ? Procedure: LEFT LATERAL MENISCAL REPAIR;  Surgeon: [name], MD;  Location: [place];  Service: Orthopedics;;  ? [place] EXTRACTION  2022  ? ?Patient Active Problem List  ? Diagnosis Date Noted  ? Peripheral tear of lateral meniscus of left knee as current injury   ? Arthrofibrosis of knee joint, left 01/31/2022  ? S/P repair of anterior cruciate ligament 01/31/2022  ? Morbid obesity with BMI of 40.0-44.9, adult ([place]) 01/31/2022  ? Hypertriglyceridemia 01/31/2022  ? Angioedema of lips- [place] 12/13/2021  ? Complex tear of lateral meniscus of left knee as current injury   ? History of  orchitis 02/08/2021  ? ?  ?PCP: Libby Maw, MD ?  ?REFERRING PROVIDER: [name], MD ?  ?REFERRING DIAG: K09.381W (ICD-10-CM) - Rupture of anterior cruciate ligament of left knee, initial encounter  ?     E99.371I (ICD-10-CM) - Complex tear of lateral meniscus of left knee as current injury, initial encounter      Z98.890 (ICD-10-CM) - S/P left knee arthroscopy  ?  ?THERAPY DIAG:  ?Left knee pain, unspecified chronicity ?  ?Stiffness of left knee, not elsewhere classified ?  ?Muscle weakness (generalized) ?  ?Difficulty in walking, not elsewhere classified ?  ?ONSET DATE: Injury 10/09/2021 / Surgery 11/08/2021 ?  ?SUBJECTIVE:  ?  ?SUBJECTIVE STATEMENT: ?"I am feeling little sore in the R knee but otherwise I am doing pretty good" ? ?PERTINENT HISTORY: ?11/08/2021 Left knee ACL reconstruction with quadriceps autograft and lateral meniscal repair.   ? ?02/02/2022: Lt knee lysis of adheresion and manipulation under anesthesia ? ?Allergy to certain adhesives including tegaderm per pt report  ?  ?PAIN:  ?Are you having pain? No ?NPRS scale: 1/10 at rest,  ?Pain location: right lateral knee  ?Pain orientation: Right  ?PAIN TYPE: aching ?Pain description: intermittent  ?Aggravating factors: use of the knee / falling off the bed  ?Relieving factors: rest   ?  ?PRECAUTIONS: Other: per Dr. Eddie Dibbles ACL reconstruction  with meniscal repair ?  ?WEIGHT BEARING RESTRICTIONS Yes  ?  ?  ?OCCUPATION: in culinary school   ?  ?PLOF: Independent; Pt was able to perform all of his ADLs/IADLs and functional mobility skills without difficulty or limitations.  Pt was able to perform work activities.  Pt ambulated without an AD with good stability without difficulty.  ?  ?PATIENT GOALS improved stability with leaning, to have the ability to run, to be able to bowl.  Return to PLOF.  ?  ?  ?OBJECTIVE:  ?*Unless otherwise noted, objective measures collected previously* ? ? ?LE ROM: ? ?A/PROM Right ? Left ?02/03/2022 Left ?02/16/2022   ?Knee flexion  93 AROM/ 97 AAROM/ 103PROM/ 106 PROM following MET 96 AROM, 102 PROM, 111 following MET  ?Knee extension  -1 AROM/ 0 PROM 2 AROM, 4 PROM  ? (Blank rows = not tested) ? ?LE MMT: ? ?MMT Right ? Left ?  ?Hip flexion  4+/5  ?Hip extension    ?Hip abduction    ?Knee flexion  5/5  ?Knee extension  4+/5  ?Ankle dorsiflexion  5/5  ?Ankle plantarflexion  5/5  ?Ankle inversion    ?Ankle eversion    ? (Blank rows = not tested) ? ? FUNCTIONAL TESTS: ? -Dead lift: Good form and depth with no pain ?  ?TODAY'S TREATMENT: ? ?[place] Adult PT Treatment:                                                DATE: 02/26/2022 ?Flexion end of session 118 with  ? ?Therapeutic Exercise: ?Recumbent bike L3 x 6 min lowering seat every 2 min to promote knee flexion ?Quad stretch standing 2 x 30 seconds ?Bridge 3 x 10 SL with PT blocking foot from sliding forward pushing knee into flexion between sets as ?Lunge with LLE back touching down onto the bosu 2 x 10, progressed to 4 inch with 2 inch airex pad ?Manual Therapy: ?Tack and stretch for the hamstring  ?PROM into flexion x5 with 30sec hold at end range sitting EOM ?Tibiofemoral AP grade IV ? ? ?[facility] Adult PT Treatment:                                                DATE: 02/24/2022 ?Therapeutic Exercise: ?Exercise bike x5 minutes while emphasizing greater knee excursion and while collecting subjective information ?Squat into heel raise and overhead reach with waist attachment and two 13# cables at Free Motion machine 3x10 ?Bridge with eccentric knee extension with sliders 2x10 ?Standing Cybex hip abduction with 25# 2x10 BIL ?4-inch lateral heel taps 2x10 on Lt ?Manual Therapy: ?PROM into flexion x5 with 30sec hold at end range ?Knee flexion contract/ counter-contract MET x5 with 30-sec hold at end range ?Neuromuscular re-ed: ?N/A ?Therapeutic Activity: ?N/A ?Modalities: ?N/A ?Self Care: ?N/A ? ? ? [place] Adult PT Treatment:                                                DATE:  02/19/2022 ?115 degrees of flexion  ? ?Therapeutic Exercise: ?Recumbent bike L3 x 6 min lowering  seat every 2 min to promote knee flexion ?PNF stretch 3 x 30 second in thomas test position ?Bridge with RLE advanced 6 in with  LLE blocked to prevent knee extension and maintain flexion  x 15 reps, performed 1 more time with RLE advanced an additional 5 inch. Last set performed 1 set of 15 with RLE extended.  ?Standing hip flexor stretching with RTB 2 x 20 ?Standing hamstring curl 2 x 15 with RTB ?Standing hip abduction 2 x 20 with RTB ?Manual Therapy: ?KT proximal edema split-taping along the knee ?AP grade IV with active flexion between sets  ?Neuromuscular re-ed: ?In // SLS LLE 1 x 30 sec EO, 2 x 30 sec EC with max postural sway ?In // tandem 4 x 30 sec EC with mod posturals way ?Therapeutic Activity: ?Pre-jumping squat into- heel raise into sudden squat 3 x 10 using visual cues to reduce lateral weight shifting.  ? ? ? ?Education details: Benefits of KT taping and if it causes any skin irritation to gently remove the tape while in the shower.  ?Person educated: Patient ?Education method: Explanation, Demonstration, Tactile cues, Verbal cues, and Handouts ?Education comprehension: verbalized understanding, returned demonstration, verbal cues required, tactile cues required, and needs further education ?  ?  ?HOME EXERCISE PROGRAM: ?Access Code: [email or web address] ?URL: [email or web address] ?Date: 11/13/2021 ?Prepared by: Ronny Flurry ?  ? ?  ?  ?ASSESSMENT: ?  ?CLINICAL IMPRESSION: ?Pt arrived to sessio noting some soreness int he R knee today and that he was able to get 112 degrees on the last session. Continued focus on knee flexion via mobs, low load long duration stretching and single leg bridge keeping L knee from extending gradually increasing flexion between sets. Following exercise/ manual he was able to get 118 degrees of flexion with AAROM. End of session he reported no increase in pain or soreness.   ? ?Objective impairments include Abnormal gait, decreased activity tolerance, decreased balance, decreased mobility, difficulty walking, decreased ROM, decreased strength, hypomobility, increased edema, impaired fl

## 2022-02-28 ENCOUNTER — Encounter: Payer: Self-pay | Admitting: Physical Therapy

## 2022-02-28 ENCOUNTER — Ambulatory Visit: Payer: No Typology Code available for payment source | Admitting: Physical Therapy

## 2022-02-28 DIAGNOSIS — M6281 Muscle weakness (generalized): Secondary | ICD-10-CM

## 2022-02-28 DIAGNOSIS — M25562 Pain in left knee: Secondary | ICD-10-CM | POA: Diagnosis not present

## 2022-02-28 DIAGNOSIS — M25662 Stiffness of left knee, not elsewhere classified: Secondary | ICD-10-CM

## 2022-02-28 NOTE — Therapy (Signed)
?OUTPATIENT PHYSICAL THERAPY TREATMENT NOTE ? ?Patient Name: Dustin Parks ?MRN: [id number] ?DOB:June 13, 2001, 21 y.o., male ?Today's Date: 02/28/2022 ? ?PCP: Haydee Salter, MD ?REFERRING PROVIDER: Dr Donivan Scull  ? ? PT End of Session - 02/28/22 0900   ? ? Visit Number [id number]   ? Number of Visits 32   ? Date for PT Re-Evaluation 03/16/22   ? Authorization Type MCE   ? PT Start Time 4098   pt arrived 13 min late  ? PT Stop Time 0930   ? PT Time Calculation (min) 32 min   ? ?  ?  ? ?  ? ? ? ? ? ? ? ? ? ? ? ? ? ? ? ? ? ? ? ? ? ? ?Past Medical History:  ?Diagnosis Date  ? Allergy   ? Asthma   ? out grown now   ? ?Past Surgical History:  ?Procedure Laterality Date  ? KNEE ARTHROSCOPY Left 02/02/2022  ? Procedure: LEFT ARTHROSCOPY KNEE LYSIS OF ADHESION AND MANIPULATION UNDER ANESTHESIA;  Surgeon: [name], MD;  Location: [place];  Service: Orthopedics;  Laterality: Left;  ? KNEE ARTHROSCOPY WITH ANTERIOR CRUCIATE LIGAMENT (ACL) REPAIR WITH HAMSTRING GRAFT Left 11/08/2021  ? Procedure: LEFT KNEE ANTERIOR CRUCIATE LIGAMENT RECONSTRUCTION WITH QUADRICEPS TENDON AUTOGRAFT;  Surgeon: [name], MD;  Location: [place];  Service: Orthopedics;  Laterality: Left;  ? KNEE ARTHROSCOPY WITH MENISCAL REPAIR  11/08/2021  ? Procedure: LEFT LATERAL MENISCAL REPAIR;  Surgeon: [name], MD;  Location: [place];  Service: Orthopedics;;  ? [place] EXTRACTION  2022  ? ?Patient Active Problem List  ? Diagnosis Date Noted  ? Peripheral tear of lateral meniscus of left knee as current injury   ? Arthrofibrosis of knee joint, left 01/31/2022  ? S/P repair of anterior cruciate ligament 01/31/2022  ? Morbid obesity with BMI of 40.0-44.9, adult ([place]) 01/31/2022  ? Hypertriglyceridemia 01/31/2022  ? Angioedema of lips- [place] 12/13/2021  ? Complex tear of lateral meniscus of left knee as current injury   ? History of orchitis 02/08/2021  ? ?  ?PCP: Libby Maw,  MD ?  ?REFERRING PROVIDER: [name], MD ?  ?REFERRING DIAG: J19.147W (ICD-10-CM) - Rupture of anterior cruciate ligament of left knee, initial encounter  ?     G95.621H (ICD-10-CM) - Complex tear of lateral meniscus of left knee as current injury, initial encounter      Z98.890 (ICD-10-CM) - S/P left knee arthroscopy  ?  ?THERAPY DIAG:  ?Left knee pain, unspecified chronicity ?  ?Stiffness of left knee, not elsewhere classified ?  ?Muscle weakness (generalized) ?  ?Difficulty in walking, not elsewhere classified ?  ?ONSET DATE: Injury 10/09/2021 / Surgery 11/08/2021 ?  ?SUBJECTIVE:  ?  ?SUBJECTIVE STATEMENT: ?"Doing pretty good, the only issue I am fatigued with working and school  ? ?PERTINENT HISTORY: ?11/08/2021 Left knee ACL reconstruction with quadriceps autograft and lateral meniscal repair.   ? ?02/02/2022: Lt knee lysis of adheresion and manipulation under anesthesia ? ?Allergy to certain adhesives including tegaderm per pt report  ?  ?PAIN:  ?Are you having pain? No ?NPRS scale: 1/10 at rest,  ?Pain location: right lateral knee  ?Pain orientation: Right  ?PAIN TYPE: aching ?Pain description: intermittent  ?Aggravating factors: use of the knee / falling off the bed  ?Relieving factors: rest   ?  ?PRECAUTIONS: Other: per Dr. Eddie Dibbles ACL reconstruction with meniscal repair ?  ?WEIGHT BEARING RESTRICTIONS Yes  ?  ?  ?  OCCUPATION: in culinary school   ?  ?PLOF: Independent; Pt was able to perform all of his ADLs/IADLs and functional mobility skills without difficulty or limitations.  Pt was able to perform work activities.  Pt ambulated without an AD with good stability without difficulty.  ?  ?PATIENT GOALS improved stability with leaning, to have the ability to run, to be able to bowl.  Return to PLOF.  ?  ?  ?OBJECTIVE:  ?*Unless otherwise noted, objective measures collected previously* ? ? ?LE ROM: ? ?A/PROM Right ? Left ?02/03/2022 Left ?02/16/2022  ?Knee flexion  93 AROM/ 97 AAROM/ 103PROM/ 106 PROM  following MET 96 AROM, 102 PROM, 111 following MET  ?Knee extension  -1 AROM/ 0 PROM 2 AROM, 4 PROM  ? (Blank rows = not tested) ? ?LE MMT: ? ?MMT Right ? Left ?  ?Hip flexion  4+/5  ?Hip extension    ?Hip abduction    ?Knee flexion  5/5  ?Knee extension  4+/5  ?Ankle dorsiflexion  5/5  ?Ankle plantarflexion  5/5  ?Ankle inversion    ?Ankle eversion    ? (Blank rows = not tested) ? ? FUNCTIONAL TESTS: ? -Dead lift: Good form and depth with no pain ?  ?TODAY'S TREATMENT: ?[facility] Adult PT Treatment:                                                DATE: 02/28/2022 ?A tx 114 , P Tx 120 with AA ? ?Therapeutic Exercise: ?Recumbent bike 4 min  lowering seat ever 2 min to promote knee flexion ?Revers walking on treadmill x 3 min  ?Hamstring curl con bil/ ecc LLE 35# 2 x 15  ?Nordic hamstring curl 1 x 10 with bolster in front controlling eccentric loading until he cant control then using his arms to control down. ?Resisted walking forward x 5 27#, backward x 5 27% using freemotion ? ? ?[place] [facility] Adult PT Treatment:                                                DATE: 02/26/2022 ?Flexion end of session 118 with  ? ?Therapeutic Exercise: ?Recumbent bike L3 x 6 min lowering seat every 2 min to promote knee flexion ?Quad stretch standing 2 x 30 seconds ?Bridge 3 x 10 SL with PT blocking foot from sliding forward pushing knee into flexion between sets as ?Lunge with LLE back touching down onto the bosu 2 x 10, progressed to 4 inch with 2 inch airex pad ?Manual Therapy: ?Tack and stretch for the hamstring  ?PROM into flexion x5 with 30sec hold at end range sitting EOM ?Tibiofemoral AP grade IV ? ? ?[facility] Adult PT Treatment:                                                DATE: 02/24/2022 ?Therapeutic Exercise: ?Exercise bike x5 minutes while emphasizing greater knee excursion and while collecting subjective information ?Squat into heel raise and overhead reach with waist attachment and two 13# cables at Free Motion machine 3x10 ?Bridge with  eccentric knee extension with sliders 2x10 ?  Standing Cybex hip abduction with 25# 2x10 BIL ?4-inch lateral heel taps 2x10 on Lt ?Manual Therapy: ?PROM into flexion x5 with 30sec hold at end range ?Knee flexion contract/ counter-contract MET x5 with 30-sec hold at end range ?Neuromuscular re-ed: ?N/A ?Therapeutic Activity: ?N/A ?Modalities: ?N/A ?Self Care: ?N/A ? ? ? ? ?Education details: Benefits of KT taping and if it causes any skin irritation to gently remove the tape while in the shower.  ?Person educated: Patient ?Education method: Explanation, Demonstration, Tactile cues, Verbal cues, and Handouts ?Education comprehension: verbalized understanding, returned demonstration, verbal cues required, tactile cues required, and needs further education ?  ?  ?HOME EXERCISE PROGRAM: ?Access Code: [email or web address] ?URL: [email or web address] ?Date: 11/13/2021 ?Prepared by: Ronny Flurry ?  ? ?  ?  ?ASSESSMENT: ?  ?CLINICAL IMPRESSION: ?Decreased sessions due to pt arriving 13 min late today. He is making progress with knee flexion starting today at 115 degrees actively and end of session he improved 120 with assistance. Continued working on quad and hamstring strengthening which he did well with and noted no pain during or following session.  ? ?Objective impairments include Abnormal gait, decreased activity tolerance, decreased balance, decreased mobility, difficulty walking, decreased ROM, decreased strength, hypomobility, increased edema, impaired flexibility, and pain. These impairments are limiting patient from cleaning, community activity, driving, meal prep, occupation, laundry, shopping, school, and ambulation . ?  ?  ?REHAB POTENTIAL: Good ?  ?CLINICAL DECISION MAKING: Stable/uncomplicated ?  ?EVALUATION COMPLEXITY: Low ?  ?  ?GOALS: ?  ?  ?SHORT TERM GOALS: ?  ?STG Name Target Date Goal status  ?1 Pt will be independent and compliant with HEP for improved pain, strength, and ROM.  ?Baseline:  12/11/2021  PARTIALLY MET  ?2 Pt will demo a good quad set and perform supine SLRs independently with brace for improved quad strength. ?Baseline:  11/27/2021 MET 02/08/2022  ?3 Pt will demo improved improved L knee extension P

## 2022-03-02 NOTE — Therapy (Incomplete)
?OUTPATIENT PHYSICAL THERAPY TREATMENT NOTE ? ?Patient Name: Dustin Parks ?MRN: [id number] ?DOB:[birth year], 21 y.o., male ?Today's Date: 03/02/2022 ? ?PCP: Haydee Salter, MD ?REFERRING PROVIDER: Dr Donivan Scull  ? ? ? ? ? ? ? ? ? ? ? ? ? ? ? ? ? ? ? ? ? ? ? ? ?Past Medical History:  ?Diagnosis Date  ? Allergy   ? Asthma   ? out grown now   ? ?Past Surgical History:  ?Procedure Laterality Date  ? KNEE ARTHROSCOPY Left 02/02/2022  ? Procedure: LEFT ARTHROSCOPY KNEE LYSIS OF ADHESION AND MANIPULATION UNDER ANESTHESIA;  Surgeon: [name], MD;  Location: [place];  Service: Orthopedics;  Laterality: Left;  ? KNEE ARTHROSCOPY WITH ANTERIOR CRUCIATE LIGAMENT (ACL) REPAIR WITH HAMSTRING GRAFT Left 11/08/2021  ? Procedure: LEFT KNEE ANTERIOR CRUCIATE LIGAMENT RECONSTRUCTION WITH QUADRICEPS TENDON AUTOGRAFT;  Surgeon: [name], MD;  Location: [place];  Service: Orthopedics;  Laterality: Left;  ? KNEE ARTHROSCOPY WITH MENISCAL REPAIR  11/08/2021  ? Procedure: LEFT LATERAL MENISCAL REPAIR;  Surgeon: [name], MD;  Location: [place];  Service: Orthopedics;;  ? [place] EXTRACTION  2022  ? ?Patient Active Problem List  ? Diagnosis Date Noted  ? Peripheral tear of lateral meniscus of left knee as current injury   ? Arthrofibrosis of knee joint, left 01/31/2022  ? S/P repair of anterior cruciate ligament 01/31/2022  ? Morbid obesity with BMI of 40.0-44.9, adult ([place]) 01/31/2022  ? Hypertriglyceridemia 01/31/2022  ? Angioedema of lips- [place] 12/13/2021  ? Complex tear of lateral meniscus of left knee as current injury   ? History of orchitis 02/08/2021  ? ?  ?PCP: Libby Maw, MD ?  ?REFERRING PROVIDER: [name], MD ?  ?REFERRING DIAG: V03.500X (ICD-10-CM) - Rupture of anterior cruciate ligament of left knee, initial encounter  ?     F81.829H (ICD-10-CM) - Complex tear of lateral meniscus of left knee as current injury, initial  encounter      Z98.890 (ICD-10-CM) - S/P left knee arthroscopy  ?  ?THERAPY DIAG:  ?Left knee pain, unspecified chronicity ?  ?Stiffness of left knee, not elsewhere classified ?  ?Muscle weakness (generalized) ?  ?Difficulty in walking, not elsewhere classified ?  ?ONSET DATE: Injury 10/09/2021 / Surgery 11/08/2021 ?  ?SUBJECTIVE:  ?  ?SUBJECTIVE STATEMENT: ?*** ? ?PERTINENT HISTORY: ?11/08/2021 Left knee ACL reconstruction with quadriceps autograft and lateral meniscal repair.   ? ?02/02/2022: Lt knee lysis of adheresion and manipulation under anesthesia ? ?Allergy to certain adhesives including tegaderm per pt report  ?  ?PAIN:  ?Are you having pain? No ?NPRS scale: 1/10 at rest,  ?Pain location: right lateral knee  ?Pain orientation: Right  ?PAIN TYPE: aching ?Pain description: intermittent  ?Aggravating factors: use of the knee / falling off the bed  ?Relieving factors: rest   ?  ?PRECAUTIONS: Other: per Dr. Eddie Dibbles ACL reconstruction with meniscal repair ?  ?WEIGHT BEARING RESTRICTIONS Yes  ?  ?  ?OCCUPATION: in culinary school   ?  ?PLOF: Independent; Pt was able to perform all of his ADLs/IADLs and functional mobility skills without difficulty or limitations.  Pt was able to perform work activities.  Pt ambulated without an AD with good stability without difficulty.  ?  ?PATIENT GOALS improved stability with leaning, to have the ability to run, to be able to bowl.  Return to PLOF.  ?  ?  ?OBJECTIVE:  ?*Unless otherwise noted, objective measures collected  previously* ? ? ?LE ROM: ? ?A/PROM Right ? Left ?02/03/2022 Left ?02/16/2022  ?Knee flexion  93 AROM/ 97 AAROM/ 103PROM/ 106 PROM following MET 96 AROM, 102 PROM, 111 following MET  ?Knee extension  -1 AROM/ 0 PROM 2 AROM, 4 PROM  ? (Blank rows = not tested) ? ?LE MMT: ? ?MMT Right ? Left ?  ?Hip flexion  4+/5  ?Hip extension    ?Hip abduction    ?Knee flexion  5/5  ?Knee extension  4+/5  ?Ankle dorsiflexion  5/5  ?Ankle plantarflexion  5/5  ?Ankle inversion     ?Ankle eversion    ? (Blank rows = not tested) ? ? FUNCTIONAL TESTS: ? -Dead lift: Good form and depth with no pain ?  ?TODAY'S TREATMENT: ? ?[facility] Adult PT Treatment:                                                DATE: 03/03/2022 ?Therapeutic Exercise: ?*** ?Manual Therapy: ?*** ?Neuromuscular re-ed: ?*** ?Therapeutic Activity: ?*** ?Modalities: ?*** ?Self Care: ?*** ? ? ?[place] Adult PT Treatment:                                                DATE: 02/28/2022 ?A tx 114 , P Tx 120 with AA ? ?Therapeutic Exercise: ?Recumbent bike 4 min  lowering seat ever 2 min to promote knee flexion ?Revers walking on treadmill x 3 min  ?Hamstring curl con bil/ ecc LLE 35# 2 x 15  ?Nordic hamstring curl 1 x 10 with bolster in front controlling eccentric loading until he cant control then using his arms to control down. ?Resisted walking forward x 5 27#, backward x 5 27% using freemotion ? ? ?[facility] Adult PT Treatment:                                                DATE: 02/26/2022 ?Flexion end of session 118 with  ? ?Therapeutic Exercise: ?Recumbent bike L3 x 6 min lowering seat every 2 min to promote knee flexion ?Quad stretch standing 2 x 30 seconds ?Bridge 3 x 10 SL with PT blocking foot from sliding forward pushing knee into flexion between sets as ?Lunge with LLE back touching down onto the bosu 2 x 10, progressed to 4 inch with 2 inch airex pad ?Manual Therapy: ?Tack and stretch for the hamstring  ?PROM into flexion x5 with 30sec hold at end range sitting EOM ?Tibiofemoral AP grade IV ? ? ? ? ? ? ?Education details: Benefits of KT taping and if it causes any skin irritation to gently remove the tape while in the shower.  ?Person educated: Patient ?Education method: Explanation, Demonstration, Tactile cues, Verbal cues, and Handouts ?Education comprehension: verbalized understanding, returned demonstration, verbal cues required, tactile cues required, and needs further education ?  ?  ?HOME EXERCISE PROGRAM: ?Access Code:  [email or web address] ?URL: [email or web address] ?Date: 11/13/2021 ?Prepared by: Ronny Flurry ?  ? ?  ?  ?ASSESSMENT: ?  ?CLINICAL IMPRESSION: ?*** ? ?Objective impairments include Abnormal gait, decreased activity tolerance, decreased balance, decreased mobility, difficulty walking, decreased ROM, decreased strength,  hypomobility, increased edema, impaired flexibility, and pain. These impairments are limiting patient from cleaning, community activity, driving, meal prep, occupation, laundry, shopping, school, and ambulation . ?  ?  ?REHAB POTENTIAL: Good ?  ?CLINICAL DECISION MAKING: Stable/uncomplicated ?  ?EVALUATION COMPLEXITY: Low ?  ?  ?GOALS: ?  ?  ?SHORT TERM GOALS: ?  ?STG Name Target Date Goal status  ?1 Pt will be independent and compliant with HEP for improved pain, strength, and ROM.  ?Baseline:  12/11/2021 PARTIALLY MET  ?2 Pt will demo a good quad set and perform supine SLRs independently with brace for improved quad strength. ?Baseline:  11/27/2021 MET 02/08/2022  ?3 Pt will demo improved improved L knee extension PROM/AROM to 0/3 and flexion PROM to 60 deg for improved mobility and stiffness. ?Baseline: 12/04/2021 GOAL MET  ?4 Pt will demo improved improved L knee extension AROM to 0 deg and  flexion AAROM/PROM to 80/90 deg for improved mobility and stiffness. ?Baseline: 12/18/2021 GOAL MET  ?5 Pt will demo L knee AROM to be 0 - 120 deg for improved stiffness and performance of functional mobility.   ?Baseline: 01/22/2022 PROGRESSING   ?6 Pt will progress Wb'ing with gait per MD orders and protocol without adverse effects.  ?Baseline: 12/18/2021 GOAL MET  ?7 Pt will progress with closed chain exercises per protocol without adverse effects for improved functional strength and stability.  ?Baseline: 12/25/2021 GOAL MET  ?8 Pt will score 0-1 on the lateral step down test on a 4 inch step for improved quad eccentric control.   01/22/2022 ?  MET  ?9 Pt will score 0-1 on the lateral step down test on a 6 inch  step for improved quad eccentric control and performance of stairs.   02/06/2022 MET 02/08/2022  ?10 Pt will ambulate with a normalized heel to toe gait without limping without AD 01/22/2022 MET 02/08/2022  ?  ?LONG TERM GOALS:

## 2022-03-05 ENCOUNTER — Encounter: Payer: Self-pay | Admitting: Physical Therapy

## 2022-03-05 ENCOUNTER — Ambulatory Visit: Payer: No Typology Code available for payment source | Attending: Family Medicine | Admitting: Physical Therapy

## 2022-03-05 DIAGNOSIS — R262 Difficulty in walking, not elsewhere classified: Secondary | ICD-10-CM | POA: Insufficient documentation

## 2022-03-05 DIAGNOSIS — M25662 Stiffness of left knee, not elsewhere classified: Secondary | ICD-10-CM | POA: Insufficient documentation

## 2022-03-05 DIAGNOSIS — M25562 Pain in left knee: Secondary | ICD-10-CM | POA: Insufficient documentation

## 2022-03-05 DIAGNOSIS — M6281 Muscle weakness (generalized): Secondary | ICD-10-CM | POA: Insufficient documentation

## 2022-03-05 NOTE — Therapy (Signed)
?OUTPATIENT PHYSICAL THERAPY TREATMENT NOTE ? ?Patient Name: Dustin Parks ?MRN: [id number] ?DOB:October 01, 2001, 21 y.o., male ?Today's Date: 03/05/2022 ? ?PCP: Haydee Salter, MD ?REFERRING PROVIDER: Dr Donivan Scull  ? ? PT End of Session - 03/05/22 0804   ? ? Visit Number 31   ? Number of Visits 32   ? Date for PT Re-Evaluation 03/16/22   ? Authorization Type MCE   ? PT Start Time 0801   ? PT Stop Time 0841   ? PT Time Calculation (min) 40 min   ? Activity Tolerance Patient tolerated treatment well   ? Behavior During Therapy [facility] for tasks assessed/performed   ? ?  ?  ? ?  ? ? ? ? ? ? ? ? ? ? ? ? ? ? ? ? ? ? ? ? ? ? ? ?Past Medical History:  ?Diagnosis Date  ? Allergy   ? Asthma   ? out grown now   ? ?Past Surgical History:  ?Procedure Laterality Date  ? KNEE ARTHROSCOPY Left 02/02/2022  ? Procedure: LEFT ARTHROSCOPY KNEE LYSIS OF ADHESION AND MANIPULATION UNDER ANESTHESIA;  Surgeon: [name], MD;  Location: [place];  Service: Orthopedics;  Laterality: Left;  ? KNEE ARTHROSCOPY WITH ANTERIOR CRUCIATE LIGAMENT (ACL) REPAIR WITH HAMSTRING GRAFT Left 11/08/2021  ? Procedure: LEFT KNEE ANTERIOR CRUCIATE LIGAMENT RECONSTRUCTION WITH QUADRICEPS TENDON AUTOGRAFT;  Surgeon: [name], MD;  Location: [place];  Service: Orthopedics;  Laterality: Left;  ? KNEE ARTHROSCOPY WITH MENISCAL REPAIR  11/08/2021  ? Procedure: LEFT LATERAL MENISCAL REPAIR;  Surgeon: [name], MD;  Location: [place];  Service: Orthopedics;;  ? [place] EXTRACTION  2022  ? ?Patient Active Problem List  ? Diagnosis Date Noted  ? Peripheral tear of lateral meniscus of left knee as current injury   ? Arthrofibrosis of knee joint, left 01/31/2022  ? S/P repair of anterior cruciate ligament 01/31/2022  ? Morbid obesity with BMI of 40.0-44.9, adult ([place]) 01/31/2022  ? Hypertriglyceridemia 01/31/2022  ? Angioedema of lips- [place] 12/13/2021  ? Complex tear of lateral meniscus of left  knee as current injury   ? History of orchitis 02/08/2021  ? ?  ?PCP: Libby Maw, MD ?  ?REFERRING PROVIDER: [name], MD ?  ?REFERRING DIAG: Y19.509T (ICD-10-CM) - Rupture of anterior cruciate ligament of left knee, initial encounter  ?     O67.124P (ICD-10-CM) - Complex tear of lateral meniscus of left knee as current injury, initial encounter      Z98.890 (ICD-10-CM) - S/P left knee arthroscopy  ?  ?THERAPY DIAG:  ?Left knee pain, unspecified chronicity ?  ?Stiffness of left knee, not elsewhere classified ?  ?Muscle weakness (generalized) ?  ?Difficulty in walking, not elsewhere classified ?  ?ONSET DATE: Injury 10/09/2021 / Surgery 11/08/2021 ?  ?SUBJECTIVE:  ?  ?SUBJECTIVE STATEMENT: ?"I am feeling stiff today, I did a lot of walking all weekend. I have been doing a lot of stretching at work in standing focusing on the knee bending." ? ?PERTINENT HISTORY: ?11/08/2021 Left knee ACL reconstruction with quadriceps autograft and lateral meniscal repair.   ? ?02/02/2022: Lt knee lysis of adheresion and manipulation under anesthesia ? ?Allergy to certain adhesives including tegaderm per pt report  ?  ?PAIN:  ?Are you having pain? No ?NPRS scale: 1/10 at rest,  ?Pain location: right lateral knee  ?Pain orientation: Right  ?PAIN TYPE: aching ?Pain description: intermittent  ?Aggravating factors: use of the knee /  falling off the bed  ?Relieving factors: rest   ?  ?PRECAUTIONS: Other: per Dr. Eddie Dibbles ACL reconstruction with meniscal repair ?  ?WEIGHT BEARING RESTRICTIONS Yes  ?  ?  ?OCCUPATION: in culinary school   ?  ?PLOF: Independent; Pt was able to perform all of his ADLs/IADLs and functional mobility skills without difficulty or limitations.  Pt was able to perform work activities.  Pt ambulated without an AD with good stability without difficulty.  ?  ?PATIENT GOALS improved stability with leaning, to have the ability to run, to be able to bowl.  Return to PLOF.  ?  ?  ?OBJECTIVE:  ?*Unless  otherwise noted, objective measures collected previously* ? ? ?LE ROM: ? ?A/PROM Right ? Left ?02/03/2022 Left ?02/16/2022  ?Knee flexion  93 AROM/ 97 AAROM/ 103PROM/ 106 PROM following MET 96 AROM, 102 PROM, 111 following MET  ?Knee extension  -1 AROM/ 0 PROM 2 AROM, 4 PROM  ? (Blank rows = not tested) ? ?LE MMT: ? ?MMT Right ? Left ?  ?Hip flexion  4+/5  ?Hip extension    ?Hip abduction    ?Knee flexion  5/5  ?Knee extension  4+/5  ?Ankle dorsiflexion  5/5  ?Ankle plantarflexion  5/5  ?Ankle inversion    ?Ankle eversion    ? (Blank rows = not tested) ? ? FUNCTIONAL TESTS: ? -Dead lift: Good form and depth with no pain ?  ?TODAY'S TREATMENT: ? ?[place] Adult PT Treatment:                                                DATE: 03/05/2022 ?P ? ?Therapeutic Exercise: ?L 2 x 6 min lowering seat at 62mn to promote knee flexion.  ?Quad stretch PNF contract/ relax  ?Low load long duration knee flexion in kneeling with feet hanging off the EOM 3 x 30 sec ?Nordic hamstring curl 2 x 10 with bolster in front controlling eccentric loading until he cant control then using his arms to control down. ?Double leg stance with controlled pistol squat onto table using LLE only 2 x 10 ?Seated hamstring curl with isometric ball squeeze at end range 2 x 10  ?Standing lunge 3 x 10, with LLE in the back to promote knee flexion , 1st set with 2 airex pads, 2nd set with 1 airex pad, 3rd set with no airex pad ?Manual Therapy: ?PROM into flexion x5 with 30sec hold at end range sitting EOM ?Tibiofemoral AP grade IV gradually flexing the knee between ? ? ? [facility]                                                [facility] ?A tx 114 , P Tx 120 with AA ? ?Therapeutic Exercise: ?Recumbent bike 4 min  lowering seat ever 2 min to promote knee flexion ?Revers walking on treadmill x 3 min  ?Hamstring curl con bil/ ecc LLE 35# 2 x 15  ?Nordic hamstring curl 1 x 10 with bolster in front controlling eccentric loading until he cant control then  using his arms to control down. ?Resisted walking forward x 5 27#, backward x 5 27% using freemotion ? ? ?[facility]  DATE: 02/26/2022 ?Flexion end of session 118 with  ? ?Therapeutic Exercise: ?Recumbent bike L3 x 6 min lowering seat every 2 min to promote knee flexion ?Quad stretch standing 2 x 30 seconds ?Bridge 3 x 10 SL with PT blocking foot from sliding forward pushing knee into flexion between sets as ?Lunge with LLE back touching down onto the bosu 2 x 10, progressed to 4 inch with 2 inch airex pad ?Manual Therapy: ?Tack and stretch for the hamstring  ?PROM into flexion x5 with 30sec hold at end range sitting EOM ?Tibiofemoral AP grade IV ? ? ?Education details: Benefits of KT taping and if it causes any skin irritation to gently remove the tape while in the shower.  ?Person educated: Patient ?Education method: Explanation, Demonstration, Tactile cues, Verbal cues, and Handouts ?Education comprehension: verbalized understanding, returned demonstration, verbal cues required, tactile cues required, and needs further education ?  ?  ?HOME EXERCISE PROGRAM: ?Access Code: [email or web address] ?URL: [email or web address] ?Date: 11/13/2021 ?Prepared by: Ronny Flurry ?  ? ?  ?  ?ASSESSMENT: ?  ?CLINICAL IMPRESSION: ?Deaunte reports consistency with stretch and exercises but does continue to demonstrate intermittent stiffness in the R knee that fluctuates. Today he was able to get 115 degrees of flexion with AAROM. Continued utilizing manual techniques and exercise to maximize knee flexion ROM but also promote quad/ hamstring strength. Plan to do a reassessment and re-cert for more visits for continued physical therapy.  ? ?Objective impairments include Abnormal gait, decreased activity tolerance, decreased balance, decreased mobility, difficulty walking, decreased ROM, decreased strength, hypomobility, increased edema, impaired flexibility, and pain. These  impairments are limiting patient from cleaning, community activity, driving, meal prep, occupation, laundry, shopping, school, and ambulation . ?  ?  ?REHAB POTENTIAL: Good ?  ?CLINICAL DECISION MAKING: [place]

## 2022-03-07 NOTE — Therapy (Signed)
?OUTPATIENT PHYSICAL THERAPY TREATMENT NOTE/ RE-EVALUATION ? ?Patient Name: Dustin Parks ?MRN: [id number] ?DOB:03/30/2001, 21 y.o., male ?Today's Date: 03/08/2022 ? ?PCP: Haydee Salter, MD ?REFERRING PROVIDER: Dr Donivan Scull  ? ? PT End of Session - 03/08/22 1109   ? ? Visit Number 32   ? Number of Visits 50   ? Date for PT Re-Evaluation 04/26/22   ? Authorization Type MCE   ? PT Start Time 1055   Pt arrived 10 minutes late to his appointment.  ? PT Stop Time 1128   ? PT Time Calculation (min) 33 min   ? Activity Tolerance Patient tolerated treatment well   ? Behavior During Therapy [facility] for tasks assessed/performed   ? ?  ?  ? ?  ? ? ? ? ? ? ? ? ? ? ? ? ? ? ? ? ? ? ? ? ? ? ? ? ?Past Medical History:  ?Diagnosis Date  ? Allergy   ? Asthma   ? out grown now   ? ?Past Surgical History:  ?Procedure Laterality Date  ? KNEE ARTHROSCOPY Left 02/02/2022  ? Procedure: LEFT ARTHROSCOPY KNEE LYSIS OF ADHESION AND MANIPULATION UNDER ANESTHESIA;  Surgeon: [name], MD;  Location: [place];  Service: Orthopedics;  Laterality: Left;  ? KNEE ARTHROSCOPY WITH ANTERIOR CRUCIATE LIGAMENT (ACL) REPAIR WITH HAMSTRING GRAFT Left 11/08/2021  ? Procedure: LEFT KNEE ANTERIOR CRUCIATE LIGAMENT RECONSTRUCTION WITH QUADRICEPS TENDON AUTOGRAFT;  Surgeon: [name], MD;  Location: [place];  Service: Orthopedics;  Laterality: Left;  ? KNEE ARTHROSCOPY WITH MENISCAL REPAIR  11/08/2021  ? Procedure: LEFT LATERAL MENISCAL REPAIR;  Surgeon: [name], MD;  Location: [place];  Service: Orthopedics;;  ? [place] EXTRACTION  2022  ? ?Patient Active Problem List  ? Diagnosis Date Noted  ? Peripheral tear of lateral meniscus of left knee as current injury   ? Arthrofibrosis of knee joint, left 01/31/2022  ? S/P repair of anterior cruciate ligament 01/31/2022  ? Morbid obesity with BMI of 40.0-44.9, adult ([place]) 01/31/2022  ? Hypertriglyceridemia 01/31/2022  ? Angioedema of  lips- [place] 12/13/2021  ? Complex tear of lateral meniscus of left knee as current injury   ? History of orchitis 02/08/2021  ? ?  ?PCP: Libby Maw, MD ?  ?REFERRING PROVIDER: [name], MD ?  ?REFERRING DIAG: N46.270J (ICD-10-CM) - Rupture of anterior cruciate ligament of left knee, initial encounter  ?     J00.938H (ICD-10-CM) - Complex tear of lateral meniscus of left knee as current injury, initial encounter      Z98.890 (ICD-10-CM) - S/P left knee arthroscopy  ?  ?THERAPY DIAG:  ?Left knee pain, unspecified chronicity ?  ?Stiffness of left knee, not elsewhere classified ?  ?Muscle weakness (generalized) ?  ?Difficulty in walking, not elsewhere classified ?  ?ONSET DATE: Injury 10/09/2021 / Surgery 11/08/2021 ?  ?SUBJECTIVE:  ?  ?SUBJECTIVE STATEMENT: ?Pt reports 1/10 Lt knee pain today, although he reports increased Lt lateral knee pain with increased walking, which he reports is from fatigue. ? ?PERTINENT HISTORY: ?11/08/2021 Left knee ACL reconstruction with quadriceps autograft and lateral meniscal repair.   ? ?02/02/2022: Lt knee lysis of adheresion and manipulation under anesthesia ? ?Allergy to certain adhesives including tegaderm per pt report  ?  ?PAIN:  ?Are you having pain? No ?NPRS scale: 1/10 at rest,  ?Pain location: right lateral knee  ?Pain orientation: Right  ?PAIN TYPE: aching ?Pain description: intermittent  ?Aggravating  factors: use of the knee / falling off the bed  ?Relieving factors: rest   ?  ?PRECAUTIONS: Other: per Dr. Eddie Dibbles ACL reconstruction with meniscal repair ?  ?WEIGHT BEARING RESTRICTIONS Yes  ?  ?  ?OCCUPATION: in culinary school   ?  ?PLOF: Independent; Pt was able to perform all of his ADLs/IADLs and functional mobility skills without difficulty or limitations.  Pt was able to perform work activities.  Pt ambulated without an AD with good stability without difficulty.  ?  ?PATIENT GOALS improved stability with leaning, to have the ability to run, to be able to  bowl.  Return to PLOF.  ?  ?  ?OBJECTIVE:  ?*Unless otherwise noted, objective measures collected previously* ? ? ?LE ROM: ? ?A/PROM Right ? Left ?02/03/2022 Left ?02/16/2022 Left ?03/08/2022  ?Knee flexion  93 AROM/ 97 AAROM/ 103PROM/ 106 PROM following MET 96 AROM, 102 PROM, 111 following MET 106 AROM, 111 PROM, 118 following MET  ?Knee extension  -1 AROM/ 0 PROM 2 AROM, 4 PROM 2 AROM, 4 PROM  ? (Blank rows = not tested) ? ?LE MMT: ? ?MMT Right ? Left ? Left ?03/08/2022  ?Hip flexion  4+/5 5/5  ?Hip extension     ?Hip abduction     ?Knee flexion  5/5 5/5  ?Knee extension  4+/5 5/5  ?Ankle dorsiflexion  5/5   ?Ankle plantarflexion  5/5   ?Ankle inversion     ?Ankle eversion     ? (Blank rows = not tested) ? ? FUNCTIONAL TESTS: ? -Dead lift: Good form and depth with no pain ?  ?TODAY'S TREATMENT: ? ?[place] Adult PT Treatment:                                                DATE: 03/08/2022 ?Therapeutic Exercise: ?Hooklying knee flexion contract/co-contract with strap 3x5 with 5-sec hold at newly established end-ranges ?Bridge with hamstring slides while holding 10# kettlebell 2x10 ?Sit-to-stand with 10# kettlebell 2x10 ?Manual Therapy: ?N/A ?Neuromuscular re-ed: ?N/A ?Therapeutic Activity: ?Re-assessment of objective measures/ rehab goals with patient education on progress made in PT to date ?Modalities: ?N/A ?Self Care: ?N/A ? ? ?[facility] Adult PT Treatment:                                                DATE: 03/05/2022 ?P ? ?Therapeutic Exercise: ?L 2 x 6 min lowering seat at 37mn to promote knee flexion.  ?Quad stretch PNF contract/ relax  ?Low load long duration knee flexion in kneeling with feet hanging off the EOM 3 x 30 sec ?Nordic hamstring curl 2 x 10 with bolster in front controlling eccentric loading until he cant control then using his arms to control down. ?Double leg stance with controlled pistol squat onto table using LLE only 2 x 10 ?Seated hamstring curl with isometric ball squeeze at end range 2 x 10  ?Standing  lunge 3 x 10, with LLE in the back to promote knee flexion , 1st set with 2 airex pads, 2nd set with 1 airex pad, 3rd set with no airex pad ?Manual Therapy: ?PROM into flexion x5 with 30sec hold at end range sitting EOM ?Tibiofemoral AP grade IV gradually flexing the knee between ? ? ? ?  [facility] Adult [facility] Treatment:                                                DATE: 02/28/2022 ?A tx 114 , P Tx 120 with AA ? ?Therapeutic Exercise: ?Recumbent bike 4 min  lowering seat ever 2 min to promote knee flexion ?Revers walking on treadmill x 3 min  ?Hamstring curl con bil/ ecc LLE 35# 2 x 15  ?Nordic hamstring curl 1 x 10 with bolster in front controlling eccentric loading until he cant control then using his arms to control down. ?Resisted walking forward x 5 27#, backward x 5 27% using freemotion ? ? ? ? ? ?Education details: Benefits of KT taping and if it causes any skin irritation to gently remove the tape while in the shower.  ?Person educated: Patient ?Education method: Explanation, Demonstration, Tactile cues, Verbal cues, and Handouts ?Education comprehension: verbalized understanding, returned demonstration, verbal cues required, tactile cues required, and needs further education ?  ?  ?HOME EXERCISE PROGRAM: ?Access Code: [email or web address] ?URL: [email or web address] ?Date: 11/13/2021 ?Prepared by: Ronny Flurry ?  ? ?  ?  ?ASSESSMENT: ?  ?CLINICAL IMPRESSION: ?Due to pt arriving 10 minutes late to his appointment, the session was truncated today. Upon re-assessment of objective measures, the pt has made good progress with his knee and hip strength, functional mobility, and knee ROM, although he remains limited in functional knee flexion AROM. He continues to make improvements in-session in this area, however. He responded well to interventions today and will continue to benefit from skilled PT to address his primary impairments and return to his prior level of function with less limitation. ? ?Objective  impairments include Abnormal gait, decreased activity tolerance, decreased balance, decreased mobility, difficulty walking, decreased ROM, decreased strength, hypomobility, increased edema, impaired flexibility, and

## 2022-03-08 ENCOUNTER — Ambulatory Visit: Payer: No Typology Code available for payment source

## 2022-03-08 DIAGNOSIS — M25562 Pain in left knee: Secondary | ICD-10-CM

## 2022-03-08 DIAGNOSIS — R262 Difficulty in walking, not elsewhere classified: Secondary | ICD-10-CM

## 2022-03-08 DIAGNOSIS — M6281 Muscle weakness (generalized): Secondary | ICD-10-CM

## 2022-03-08 DIAGNOSIS — M25662 Stiffness of left knee, not elsewhere classified: Secondary | ICD-10-CM

## 2022-03-09 NOTE — Therapy (Incomplete)
?OUTPATIENT PHYSICAL THERAPY TREATMENT NOTE/ RE-EVALUATION ? ?Patient Name: Dustin Parks ?MRN: [id number] ?DOB:Sep 02, 2001, 21 y.o., male, male ?Today's Date: 03/09/2022 ? ?PCP: Haydee Salter, MD ?REFERRING PROVIDER: Dr Donivan Scull  ? ? ? ? ? ? ? ? ? ? ? ? ? ? ? ? ? ? ? ? ? ? ? ? ? ? ?Past Medical History:  ?Diagnosis Date  ? Allergy   ? Asthma   ? out grown now   ? ?Past Surgical History:  ?Procedure Laterality Date  ? KNEE ARTHROSCOPY Left 02/02/2022  ? Procedure: LEFT ARTHROSCOPY KNEE LYSIS OF ADHESION AND MANIPULATION UNDER ANESTHESIA;  Surgeon: [name], MD;  Location: [facility];  Service: Orthopedics;  Laterality: Left;  ? KNEE ARTHROSCOPY WITH ANTERIOR CRUCIATE LIGAMENT (ACL) REPAIR WITH HAMSTRING GRAFT Left 11/08/2021  ? Procedure: LEFT KNEE ANTERIOR CRUCIATE LIGAMENT RECONSTRUCTION WITH QUADRICEPS TENDON AUTOGRAFT;  Surgeon: [name], MD;  Location: [facility] [facility];  Service: Orthopedics;  Laterality: Left;  ? KNEE ARTHROSCOPY WITH MENISCAL REPAIR  11/08/2021  ? Procedure: LEFT LATERAL MENISCAL REPAIR;  Surgeon: [name], MD;  Location: [facility];  Service: Orthopedics;;  ? [place] EXTRACTION  2022  ? ?Patient Active Problem List  ? Diagnosis Date Noted  ? Peripheral tear of lateral meniscus of left knee as current injury   ? Arthrofibrosis of knee joint, left 01/31/2022  ? S/P repair of anterior cruciate ligament 01/31/2022  ? Morbid obesity with BMI of 40.0-44.9, adult ([place]) 01/31/2022  ? Hypertriglyceridemia 01/31/2022  ? Angioedema of lips- [place] 12/13/2021  ? Complex tear of lateral meniscus of left knee as current injury   ? History of orchitis 02/08/2021  ? ?  ?PCP: Libby Maw, MD ?  ?REFERRING PROVIDER: [name], MD ?  ?REFERRING DIAG: O97.353G (ICD-10-CM) - Rupture of anterior cruciate ligament of left knee, initial encounter  ?     D92.426S (ICD-10-CM) - Complex tear of lateral meniscus of left knee as current  injury, initial encounter      Z98.890 (ICD-10-CM) - S/P left knee arthroscopy  ?  ?THERAPY DIAG:  ?Left knee pain, unspecified chronicity ?  ?Stiffness of left knee, not elsewhere classified ?  ?Muscle weakness (generalized) ?  ?Difficulty in walking, not elsewhere classified ?  ?ONSET DATE: Injury 10/09/2021 / Surgery 11/08/2021 ?  ?SUBJECTIVE:  ?  ?SUBJECTIVE STATEMENT: ?*** ? ?PERTINENT HISTORY: ?11/08/2021 Left knee ACL reconstruction with quadriceps autograft and lateral meniscal repair.   ? ?02/02/2022: Lt knee lysis of adheresion and manipulation under anesthesia ? ?Allergy to certain adhesives including tegaderm per pt report  ?  ?PAIN:  ?Are you having pain? No ?NPRS scale: 1/10 at rest,  ?Pain location: right lateral knee  ?Pain orientation: Right  ?PAIN TYPE: aching ?Pain description: intermittent  ?Aggravating factors: use of the knee / falling off the bed  ?Relieving factors: rest   ?  ?PRECAUTIONS: Other: per Dr. Eddie Dibbles ACL reconstruction with meniscal repair ?  ?WEIGHT BEARING RESTRICTIONS Yes  ?  ?  ?OCCUPATION: in culinary school   ?  ?PLOF: Independent; Pt was able to perform all of his ADLs/IADLs and functional mobility skills without difficulty or limitations.  Pt was able to perform work activities.  Pt ambulated without an AD with good stability without difficulty.  ?  ?PATIENT GOALS improved stability with leaning, to have the ability to run, to be able to bowl.  Return to PLOF.  ?  ?  ?OBJECTIVE:  ?*Unless otherwise noted,  objective measures collected previously* ? ? ?LE ROM: ? ?A/PROM Right ? Left ?02/03/2022 Left ?02/16/2022 Left ?03/08/2022  ?Knee flexion  93 AROM/ 97 AAROM/ 103PROM/ 106 PROM following MET 96 AROM, 102 PROM, 111 following MET 106 AROM, 111 PROM, 118 following MET  ?Knee extension  -1 AROM/ 0 PROM 2 AROM, 4 PROM 2 AROM, 4 PROM  ? (Blank rows = not tested) ? ?LE MMT: ? ?MMT Right ? Left ? Left ?03/08/2022  ?Hip flexion  4+/5 5/5  ?Hip extension     ?Hip abduction     ?Knee flexion   5/5 5/5  ?Knee extension  4+/5 5/5  ?Ankle dorsiflexion  5/5   ?Ankle plantarflexion  5/5   ?Ankle inversion     ?Ankle eversion     ? (Blank rows = not tested) ? ? FUNCTIONAL TESTS: ? -Dead lift: Good form and depth with no pain ?  ?TODAY'S TREATMENT: ? ?[facility] Adult PT Treatment:                                                DATE: 03/10/2022 ?Therapeutic Exercise: ?*** ?Manual Therapy: ?*** ?Neuromuscular re-ed: ?*** ?Therapeutic Activity: ?*** ?Modalities: ?*** ?Self Care: ?*** ? ? ?[facility] Adult PT Treatment:                                                DATE: 03/08/2022 ?Therapeutic Exercise: ?Hooklying knee flexion contract/co-contract with strap 3x5 with 5-sec hold at newly established end-ranges ?Bridge with hamstring slides while holding 10# kettlebell 2x10 ?Sit-to-stand with 10# kettlebell 2x10 ?Manual Therapy: ?N/A ?Neuromuscular re-ed: ?N/A ?Therapeutic Activity: ?Re-assessment of objective measures/ rehab goals with patient education on progress made in PT to date ?Modalities: ?N/A ?Self Care: ?N/A ? ? ?[facility] Adult PT Treatment:                                                DATE: 03/05/2022 ?P ? ?Therapeutic Exercise: ?L 2 x 6 min lowering seat at 35mn to promote knee flexion.  ?Quad stretch PNF contract/ relax  ?Low load long duration knee flexion in kneeling with feet hanging off the EOM 3 x 30 sec ?Nordic hamstring curl 2 x 10 with bolster in front controlling eccentric loading until he cant control then using his arms to control down. ?Double leg stance with controlled pistol squat onto table using LLE only 2 x 10 ?Seated hamstring curl with isometric ball squeeze at end range 2 x 10  ?Standing lunge 3 x 10, with LLE in the back to promote knee flexion , 1st set with 2 airex pads, 2nd set with 1 airex pad, 3rd set with no airex pad ?Manual Therapy: ?PROM into flexion x5 with 30sec hold at end range sitting EOM ?Tibiofemoral AP grade IV gradually flexing the knee between ? ? ? ? ? ? ? ? ?Education details:  Benefits of KT taping and if it causes any skin irritation to gently remove the tape while in the shower.  ?Person educated: Patient ?Education method: Explanation, Demonstration, Tactile cues, Verbal cues, and Handouts ?Education comprehension: verbalized understanding, returned  demonstration, verbal cues required, tactile cues required, and needs further education ?  ?  ?HOME EXERCISE PROGRAM: ?Access Code: [email or web address] ?URL: [email or web address] ?Date: 11/13/2021 ?Prepared by: Ronny Flurry ?  ? ?  ?  ?ASSESSMENT: ?  ?CLINICAL IMPRESSION: ?*** ? ? ?Objective impairments include Abnormal gait, decreased activity tolerance, decreased balance, decreased mobility, difficulty walking, decreased ROM, decreased strength, hypomobility, increased edema, impaired flexibility, and pain. These impairments are limiting patient from cleaning, community activity, driving, meal prep, occupation, laundry, shopping, school, and ambulation . ?  ?  ?REHAB POTENTIAL: Good ?  ?CLINICAL DECISION MAKING: Stable/uncomplicated ?  ?EVALUATION COMPLEXITY: Low ?  ?  ?GOALS: ?  ?  ?SHORT TERM GOALS: ?  ?STG Name Target Date Goal status  ?1 Pt will be independent and compliant with HEP for improved pain, strength, and ROM.  ?Baseline:  12/11/2021 PARTIALLY MET  ?2 Pt will demo a good quad set and perform supine SLRs independently with brace for improved quad strength. ?Baseline:  11/27/2021 MET 02/08/2022  ?3 Pt will demo improved improved L knee extension PROM/AROM to 0/3 and flexion PROM to 60 deg for improved mobility and stiffness. ?Baseline: 12/04/2021 GOAL MET  ?4 Pt will demo improved improved L knee extension AROM to 0 deg and  flexion AAROM/PROM to 80/90 deg for improved mobility and stiffness. ?Baseline: 12/18/2021 GOAL MET  ?5 Pt will demo L knee AROM to be 0 - 120 deg for improved stiffness and performance of functional mobility.   ?Baseline: ?03/08/2022: 106 AROM 01/22/2022 PROGRESSING   ?6 Pt will progress Wb'ing with gait per  MD orders and protocol without adverse effects.  ?Baseline: 12/18/2021 GOAL MET  ?7 Pt will progress with closed chain exercises per protocol without adverse effects for improved functional strength and stabi

## 2022-03-10 ENCOUNTER — Ambulatory Visit: Payer: No Typology Code available for payment source

## 2022-03-12 ENCOUNTER — Ambulatory Visit: Payer: No Typology Code available for payment source | Admitting: Physical Therapy

## 2022-03-12 ENCOUNTER — Encounter: Payer: Self-pay | Admitting: Physical Therapy

## 2022-03-12 DIAGNOSIS — M25562 Pain in left knee: Secondary | ICD-10-CM | POA: Diagnosis not present

## 2022-03-12 DIAGNOSIS — M25662 Stiffness of left knee, not elsewhere classified: Secondary | ICD-10-CM

## 2022-03-12 DIAGNOSIS — M6281 Muscle weakness (generalized): Secondary | ICD-10-CM

## 2022-03-12 DIAGNOSIS — R262 Difficulty in walking, not elsewhere classified: Secondary | ICD-10-CM

## 2022-03-12 NOTE — Therapy (Signed)
OUTPATIENT PHYSICAL THERAPY TREATMENT NOTE  Patient Name: Dustin Parks MRN: [id number] DOB:09-Sep-2001, 21 y.o., male Today's Date: 03/12/2022  PCP: Loyola Mast, MD REFERRING PROVIDER: Dr Maricela Bo    PT End of Session - 03/12/22 0804     Visit Number [phone]    Number of Visits 50    Date for PT Re-Evaluation 04/26/22    Authorization Type MCE    Authorization - Number of Visits 12    PT Start Time 0801    PT Stop Time 0840    PT Time Calculation (min) 39 min    Activity Tolerance Patient tolerated treatment well    Behavior During Therapy [facility] for tasks assessed/performed                                    Past Medical History:  Diagnosis Date   Allergy    Asthma    out grown now    Past Surgical History:  Procedure Laterality Date   KNEE ARTHROSCOPY Left 02/02/2022   Procedure: LEFT ARTHROSCOPY KNEE LYSIS OF ADHESION AND MANIPULATION UNDER ANESTHESIA;  Surgeon: Huel Cote, MD;  Location: [facility];  Service: Orthopedics;  Laterality: Left;   KNEE ARTHROSCOPY WITH ANTERIOR CRUCIATE LIGAMENT (ACL) REPAIR WITH HAMSTRING GRAFT Left 11/08/2021   Procedure: LEFT KNEE ANTERIOR CRUCIATE LIGAMENT RECONSTRUCTION WITH QUADRICEPS TENDON AUTOGRAFT;  Surgeon: Huel Cote, MD;  Location: [facility];  Service: Orthopedics;  Laterality: Left;   KNEE ARTHROSCOPY WITH MENISCAL REPAIR  11/08/2021   Procedure: LEFT LATERAL MENISCAL REPAIR;  Surgeon: Huel Cote, MD;  Location: [facility];  Service: Orthopedics;;   WISDOM TOOTH EXTRACTION  2022   Patient Active Problem List   Diagnosis Date Noted   Peripheral tear of lateral meniscus of left knee as current injury    Arthrofibrosis of knee joint, left 01/31/2022   S/P repair of anterior cruciate ligament 01/31/2022   Morbid obesity with BMI of 40.0-44.9, adult ([facility]) 01/31/2022   Hypertriglyceridemia 01/31/2022   Angioedema of lips- [place] 12/13/2021    Complex tear of lateral meniscus of left knee as current injury    History of orchitis 02/08/2021     PCP: Mliss Sax, MD   REFERRING PROVIDER: Huel Cote, MD   REFERRING DIAG: [phone] (ICD-10-CM) - Rupture of anterior cruciate ligament of left knee, initial encounter       S83.272A (ICD-10-CM) - Complex tear of lateral meniscus of left knee as current injury, initial encounter      Z98.890 (ICD-10-CM) - S/P left knee arthroscopy    THERAPY DIAG:  Left knee pain, unspecified chronicity   Stiffness of left knee, not elsewhere classified   Muscle weakness (generalized)   Difficulty in walking, not elsewhere classified   ONSET DATE: Injury 10/09/2021 / Surgery 11/08/2021   SUBJECTIVE:    SUBJECTIVE STATEMENT: " I had my significant other sat on my leg and helped bend my knee. I think it really helped."  PERTINENT HISTORY: 11/08/2021 Left knee ACL reconstruction with quadriceps autograft and lateral meniscal repair.    02/02/2022: Lt knee lysis of adheresion and manipulation under anesthesia  Allergy to certain adhesives including tegaderm per pt report    PAIN:  Are you having pain? No NPRS scale: 1/10 at rest,  Pain location: right lateral knee  Pain orientation: Right  PAIN TYPE: aching Pain description: intermittent  Aggravating factors: use of the  knee / falling off the bed  Relieving factors: rest     PRECAUTIONS: Other: per Dr. Serena Croissant ACL reconstruction with meniscal repair   WEIGHT BEARING RESTRICTIONS Yes      OCCUPATION: in culinary school     PLOF: Independent; Pt was able to perform all of his ADLs/IADLs and functional mobility skills without difficulty or limitations.  Pt was able to perform work activities.  Pt ambulated without an AD with good stability without difficulty.    PATIENT GOALS improved stability with leaning, to have the ability to run, to be able to bowl.  Return to PLOF.      OBJECTIVE:  *Unless otherwise noted,  objective measures collected previously*   LE ROM:  A/PROM Right  Left 02/03/2022 Left 02/16/2022 Left 03/08/2022  Knee flexion  93 AROM/ 97 AAROM/ 103PROM/ 106 PROM following MET 96 AROM, 102 PROM, 111 following MET 106 AROM, 111 PROM, 118 following MET  Knee extension  -1 AROM/ 0 PROM 2 AROM, 4 PROM 2 AROM, 4 PROM   (Blank rows = not tested)  LE MMT:  MMT Right  Left  Left 03/08/2022  Hip flexion  4+/5 5/5  Hip extension     Hip abduction     Knee flexion  5/5 5/5  Knee extension  4+/5 5/5  Ankle dorsiflexion  5/5   Ankle plantarflexion  5/5   Ankle inversion     Ankle eversion      (Blank rows = not tested)   FUNCTIONAL TESTS:  -Dead lift: Good form and depth with no pain   TODAY'S TREATMENT: [facility] Adult PT Treatment:                                                DATE: 03/12/2022 A TX 116, P 122  Therapeutic Exercise: Recumbent bike x 5 min with seated lowered to max level Elliptical x 4 min L5 and ramp L5  Quad stretch 2 x 30 sec Bridge LLE SLS 1 x 30 with knee blocked in max flexion  Standing lunge onto airex pad with LLE backward 2 x 12 LAQ 2 x 15 5# with isometric hold x 3 second   [facility] Adult PT Treatment:                                                DATE: 03/08/2022 Therapeutic Exercise: Hooklying knee flexion contract/co-contract with strap 3x5 with 5-sec hold at newly established end-ranges Bridge with hamstring slides while holding 10# kettlebell 2x10 Sit-to-stand with 10# kettlebell 2x10 Manual Therapy: N/A Neuromuscular re-ed: N/A Therapeutic Activity: Re-assessment of objective measures/ rehab goals with patient education on progress made in PT to date Modalities: N/A Self Care: N/A   [facility] Adult PT Treatment:                                                DATE: 03/05/2022 Therapeutic Exercise: L 2 x 6 min lowering seat at to promote knee flexion.  Quad stretch PNF contract/ relax  Low load long duration knee flexion in kneeling with feet  hanging off  the EOM 3 x 30 sec Nordic hamstring curl 2 x 10 with bolster in front controlling eccentric loading until he cant control then using his arms to control down. Double leg stance with controlled pistol squat onto table using LLE only 2 x 10 Seated hamstring curl with isometric ball squeeze at end range 2 x 10  Standing lunge 3 x 10, with LLE in the back to promote knee flexion , 1st set with 2 airex pads, 2nd set with 1 airex pad, 3rd set with no airex pad Manual Therapy: PROM into flexion x5 with 30sec hold at end range sitting EOM Tibiofemoral AP grade IV gradually flexing the knee between         Education details: Benefits of KT taping and if it causes any skin irritation to gently remove the tape while in the shower.  Person educated: Patient Education method: Explanation, Demonstration, Tactile cues, Verbal cues, and Handouts Education comprehension: verbalized understanding, returned demonstration, verbal cues required, tactile cues required, and needs further education     HOME EXERCISE PROGRAM: Access Code: [email or web address] URL: [email or web address] Date: 11/13/2021 Prepared by: Aaron Edelman        ASSESSMENT:   CLINICAL IMPRESSION: Dustin Parks reports he is consistent with his HEP at home and is making good progress with his ROM today starting at 116 degrees and was able to to get 122 degrees at the end of the session. Focused session on gross hip / knee strengthening that focused a good deal on knee flexion. Overall he did very well with all prescribed exercises today. Plan to drop him down to 2 x a week as he can continue to maintain his current ROM and functional progression.   Objective impairments include Abnormal gait, decreased activity tolerance, decreased balance, decreased mobility, difficulty walking, decreased ROM, decreased strength, hypomobility, increased edema, impaired flexibility, and pain. These impairments are limiting patient from  cleaning, community activity, driving, meal prep, occupation, laundry, shopping, school, and ambulation .     REHAB POTENTIAL: Good   CLINICAL DECISION MAKING: Stable/uncomplicated   EVALUATION COMPLEXITY: Low     GOALS:     SHORT TERM GOALS:   STG Name Target Date Goal status  1 Pt will be independent and compliant with HEP for improved pain, strength, and ROM.  Baseline:  12/11/2021 PARTIALLY MET  2 Pt will demo a good quad set and perform supine SLRs independently with brace for improved quad strength. Baseline:  11/27/2021 MET 02/08/2022  3 Pt will demo improved improved L knee extension PROM/AROM to 0/3 and flexion PROM to 60 deg for improved mobility and stiffness. Baseline: 12/04/2021 GOAL MET  4 Pt will demo improved improved L knee extension AROM to 0 deg and  flexion AAROM/PROM to 80/90 deg for improved mobility and stiffness. Baseline: 12/18/2021 GOAL MET  5 Pt will demo L knee AROM to be 0 - 120 deg for improved stiffness and performance of functional mobility.   Baseline: 03/08/2022: 106 AROM 01/22/2022 PROGRESSING   6 Pt will progress Wb'ing with gait per MD orders and protocol without adverse effects.  Baseline: 12/18/2021 GOAL MET  7 Pt will progress with closed chain exercises per protocol without adverse effects for improved functional strength and stability.  Baseline: 12/25/2021 GOAL MET  8 Pt will score 0-1 on the lateral step down test on a 4 inch step for improved quad eccentric control.   01/22/2022   MET  9 Pt will score 0-1 on the lateral step down test on a 6  inch step for improved quad eccentric control and performance of stairs.   02/06/2022 MET 02/08/2022  10 Pt will ambulate with a normalized heel to toe gait without limping without AD 01/22/2022 MET 02/08/2022    LONG TERM GOALS:    LTG Name Target Date Goal status  1 Pt will be able to perform his ADLs/IADLs and normal functional mobility skills without significant difficulty or pain.  Baseline: Pt reports some  mild-to-moderate difficulty with getting into bed and crouching at work 03/05/2022   PROGRESSING  2 Pt will ambulate extended community distance without an AD without increased pain or difficulty.  Baseline: 03/08/2022: Pt reports ability to walk community distances without AD, but with moderate fatigue 02/19/2022 PROGRESSING  3 Pt will be able to perform his normal daily transfers without difficulty.  Baseline: 02/05/2022 ACHIEVED  4 Pt will demo good squatting form, symmetrical Wb'ing, and good knee alignment without significant pain for improved functional strength and tolerance with IADLs.  Baseline: 02/03/2022: ACHIEVED 02/19/2022 ACHIEVED  5 Pt will be able to perform stairs with a reciprocal gait without the rail with good control.  Baseline: 02/03/2022: ACHIEVED 03/05/2022 ACHIEVED  6 Pt will be able to perform all of his school activities and work activities without significant pain or difficulty.   Baseline: 03/08/2022: Pt reports difficulty with crouching at work and climbing ladders 02/19/2022 PROGRESSING  7 Pt will demo 5/5 L hip and knee strength for improved tolerance with and performance of functional mobility skills and to assist in returning to PLOF.   Baseline: 03/08/2022: 5/5 globally 03/05/2022 ACHIEVED    PLAN: PT FREQUENCY: 2-3x/week   PT DURATION: other: 6 weeks   PLANNED INTERVENTIONS: Therapeutic exercises, Therapeutic activity, Neuro Muscular re-education, Balance training, Gait training, Patient/Family education, Joint mobilization, Stair training, DME instructions, Aquatic Therapy, Electrical stimulation, Cryotherapy, Moist heat, Taping, and Manual therapy   PLAN FOR NEXT SESSION:  aggressive knee AROM, closed-chain strengthening and intro to plyometrics as able. Response to taping.  Allergy to certain adhesives including tegaderm.    Wess Baney PT, DPT, LAT, ATC  03/12/22  8:44 AM

## 2022-03-14 ENCOUNTER — Ambulatory Visit: Payer: No Typology Code available for payment source

## 2022-03-16 NOTE — Therapy (Signed)
?OUTPATIENT PHYSICAL THERAPY TREATMENT NOTE ? ?Patient Name: Dustin Parks ?MRN: [id number] ?DOB:2001-06-09, 21 y.o., male ?Today's Date: 03/17/2022 ? ?PCP: Haydee Salter, MD ?REFERRING PROVIDER: Dr Donivan Scull  ? ? PT End of Session - 03/17/22 1001   ? ? Visit Number [id number]   ? Number of Visits 50   ? Date for PT Re-Evaluation 04/26/22   ? Authorization Type MCE   ? PT Start Time 1001   Pt arrived 15 minutes late to his appointment.  ? PT Stop Time 1036   10 minutes vasopneumatic treatment  ? PT Time Calculation (min) 35 min   ? Activity Tolerance Patient tolerated treatment well   ? Behavior During Therapy [facility] for tasks assessed/performed   ? ?  ?  ? ?  ? ? ? ? ? ? ? ? ? ? ? ? ? ? ? ? ? ? ? ? ? ? ? ? ? ? ?Past Medical History:  ?Diagnosis Date  ? Allergy   ? Asthma   ? out grown now   ? ?Past Surgical History:  ?Procedure Laterality Date  ? KNEE ARTHROSCOPY Left 02/02/2022  ? Procedure: LEFT ARTHROSCOPY KNEE LYSIS OF ADHESION AND MANIPULATION UNDER ANESTHESIA;  Surgeon: [name], MD;  Location: [place];  Service: Orthopedics;  Laterality: Left;  ? KNEE ARTHROSCOPY WITH ANTERIOR CRUCIATE LIGAMENT (ACL) REPAIR WITH HAMSTRING GRAFT Left 11/08/2021  ? Procedure: LEFT KNEE ANTERIOR CRUCIATE LIGAMENT RECONSTRUCTION WITH QUADRICEPS TENDON AUTOGRAFT;  Surgeon: [name], MD;  Location: [place];  Service: Orthopedics;  Laterality: Left;  ? KNEE ARTHROSCOPY WITH MENISCAL REPAIR  11/08/2021  ? Procedure: LEFT LATERAL MENISCAL REPAIR;  Surgeon: [name], MD;  Location: [place];  Service: Orthopedics;;  ? [place] EXTRACTION  2022  ? ?Patient Active Problem List  ? Diagnosis Date Noted  ? Peripheral tear of lateral meniscus of left knee as current injury   ? Arthrofibrosis of knee joint, left 01/31/2022  ? S/P repair of anterior cruciate ligament 01/31/2022  ? Morbid obesity with BMI of 40.0-44.9, adult ([place]) 01/31/2022  ? Hypertriglyceridemia  01/31/2022  ? Angioedema of lips- [place] 12/13/2021  ? Complex tear of lateral meniscus of left knee as current injury   ? History of orchitis 02/08/2021  ? ?  ?PCP: Libby Maw, MD ?  ?REFERRING PROVIDER: [name], MD ?  ?REFERRING DIAG: Z60.109N (ICD-10-CM) - Rupture of anterior cruciate ligament of left knee, initial encounter  ?     A35.573U (ICD-10-CM) - Complex tear of lateral meniscus of left knee as current injury, initial encounter      Z98.890 (ICD-10-CM) - S/P left knee arthroscopy  ?  ?THERAPY DIAG:  ?Left knee pain, unspecified chronicity ?  ?Stiffness of left knee, not elsewhere classified ?  ?Muscle weakness (generalized) ?  ?Difficulty in walking, not elsewhere classified ?  ?ONSET DATE: Injury 10/09/2021 / Surgery 11/08/2021 ?  ?SUBJECTIVE:  ?  ?SUBJECTIVE STATEMENT: ?Pt reports that when standing with his knee in full extension, he has noticed that his knee can give way sometimes. He reports not doing his HEP other than quad stretches. ? ?PERTINENT HISTORY: ?11/08/2021 Left knee ACL reconstruction with quadriceps autograft and lateral meniscal repair.   ? ?02/02/2022: Lt knee lysis of adheresion and manipulation under anesthesia ? ?Allergy to certain adhesives including tegaderm per pt report  ?  ?PAIN:  ?Are you having pain? No ?NPRS scale: 1/10 at rest,  ?Pain location: right lateral knee  ?  Pain orientation: Right  ?PAIN TYPE: aching ?Pain description: intermittent  ?Aggravating factors: use of the knee / falling off the bed  ?Relieving factors: rest   ?  ?PRECAUTIONS: Other: per Dr. Eddie Dibbles ACL reconstruction with meniscal repair ?  ?WEIGHT BEARING RESTRICTIONS Yes  ?  ?  ?OCCUPATION: in culinary school   ?  ?PLOF: Independent; Pt was able to perform all of his ADLs/IADLs and functional mobility skills without difficulty or limitations.  Pt was able to perform work activities.  Pt ambulated without an AD with good stability without difficulty.  ?  ?PATIENT GOALS improved stability  with leaning, to have the ability to run, to be able to bowl.  Return to PLOF.  ?  ?  ?OBJECTIVE:  ?*Unless otherwise noted, objective measures collected previously* ? ? ?LE ROM: ? ?A/PROM Right ? Left ?02/03/2022 Left ?02/16/2022 Left ?03/08/2022  ?Knee flexion  93 AROM/ 97 AAROM/ 103PROM/ 106 PROM following MET 96 AROM, 102 PROM, 111 following MET 106 AROM, 111 PROM, 118 following MET  ?Knee extension  -1 AROM/ 0 PROM 2 AROM, 4 PROM 2 AROM, 4 PROM  ? (Blank rows = not tested) ? ?LE MMT: ? ?MMT Right ? Left ? Left ?03/08/2022  ?Hip flexion  4+/5 5/5  ?Hip extension     ?Hip abduction     ?Knee flexion  5/5 5/5  ?Knee extension  4+/5 5/5  ?Ankle dorsiflexion  5/5   ?Ankle plantarflexion  5/5   ?Ankle inversion     ?Ankle eversion     ? (Blank rows = not tested) ? ? FUNCTIONAL TESTS: ? -Dead lift: Good form and depth with no pain ?  ?TODAY'S TREATMENT: ? ?[place] Adult PT Treatment:                                                DATE: 03/17/2022 ?Therapeutic Exercise: ?Squat to chair with Airex pad on top with BTB Above Lt knee with lateral pull, cues to avoid genu varus 3x10 ?Standing hip adduction with BTB 2x10 ?Standing hip flexion with subsequent knee extension 3x10 on Lt ?Standing hip extension with 7# cable 2x10 on Lt ?Standing hamstring curl with 7# cable 2x10 on Lt ?Standing chair stretch x2 min on Lt ?Manual Therapy: ?N/A ?Neuromuscular re-ed: ?N/A ?Therapeutic Activity: ?N/A ?Modalities: ?GameReady vasopneumatic treatment at [place] to Lt knee with Lt LE elevated x10 minutes ?Self Care: ?N/A ? ? ?[place] Adult PT Treatment:                                                DATE: 03/12/2022 ?A TX 116, P 122 ? ?Therapeutic Exercise: ?Recumbent bike x 5 min with seated lowered to max level ?Elliptical x 4 min L5 and ramp L5  ?Quad stretch 2 x 30 sec ?Bridge LLE SLS 1 x 30 with knee blocked in max flexion  ?Standing lunge onto airex pad with LLE backward 2 x 12 ?LAQ 2 x 15 5# with isometric hold x 3 second ? ? ?[facility] Adult PT  Treatment:  DATE: 03/08/2022 ?Therapeutic Exercise: ?Hooklying knee flexion contract/co-contract with strap 3x5 with 5-sec hold at newly established end-ranges ?Bridge with hamstring slides while holding 10# kettlebell 2x10 ?Sit-to-stand with 10# kettlebell 2x10 ?Manual Therapy: ?N/A ?Neuromuscular re-ed: ?N/A ?Therapeutic Activity: ?Re-assessment of objective measures/ rehab goals with patient education on progress made in PT to date ?Modalities: ?N/A ?Self Care: ?N/A ? ? ? ? ? ? ? ?Education details: Benefits of KT taping and if it causes any skin irritation to gently remove the tape while in the shower.  ?Person educated: Patient ?Education method: Explanation, Demonstration, Tactile cues, Verbal cues, and Handouts ?Education comprehension: verbalized understanding, returned demonstration, verbal cues required, tactile cues required, and needs further education ?  ?  ?HOME EXERCISE PROGRAM: ?Access Code: [email or web address] ?URL: [email or web address] ?Date: 11/13/2021 ?Prepared by: Ronny Flurry ?  ? ?  ?  ?ASSESSMENT: ?  ?CLINICAL IMPRESSION: ?Upon observing functional squat, the Lt knee tends to deviate laterally. This is perceived to be due to a combination of increased Lt hip abduction and decreased loading through the Lt LE. This was improved in-session with squatting with banded lateral resistance with cues to prevent genu varus. The pt responded well to all interventions today, demonstrating good form and no increase in pain. He will continue to benefit from skilled PT to address his primary impairments and return to his prior level of function with less limitation.  ? ?Objective impairments include Abnormal gait, decreased activity tolerance, decreased balance, decreased mobility, difficulty walking, decreased ROM, decreased strength, hypomobility, increased edema, impaired flexibility, and pain. These impairments are limiting patient from cleaning,  community activity, driving, meal prep, occupation, laundry, shopping, school, and ambulation . ?  ?  ?REHAB POTENTIAL: Good ?  ?CLINICAL DECISION MAKING: Stable/uncomplicated ?  ?EVALUATION COMPLEXITY: Low ?  ?  ?GOALS:

## 2022-03-17 ENCOUNTER — Ambulatory Visit: Payer: No Typology Code available for payment source

## 2022-03-17 DIAGNOSIS — M6281 Muscle weakness (generalized): Secondary | ICD-10-CM

## 2022-03-17 DIAGNOSIS — R262 Difficulty in walking, not elsewhere classified: Secondary | ICD-10-CM

## 2022-03-17 DIAGNOSIS — M25562 Pain in left knee: Secondary | ICD-10-CM

## 2022-03-17 DIAGNOSIS — M25662 Stiffness of left knee, not elsewhere classified: Secondary | ICD-10-CM

## 2022-03-19 ENCOUNTER — Telehealth: Payer: Self-pay | Admitting: Orthopaedic Surgery

## 2022-03-19 NOTE — Telephone Encounter (Signed)
Patient called. He would like [place] to call him. He has questions and concerns. His call back number is [phone] ?

## 2022-03-22 NOTE — Therapy (Incomplete)
OUTPATIENT PHYSICAL THERAPY TREATMENT NOTE  Patient Name: Dustin Parks MRN: [id number] DOB:27-Apr-2001, 21 y.o., male Today's Date: 03/22/2022  PCP: Haydee Salter, MD REFERRING PROVIDER: Dr Donivan Scull                              Past Medical History:  Diagnosis Date   Allergy    Asthma    out grown now    Past Surgical History:  Procedure Laterality Date   KNEE ARTHROSCOPY Left 02/02/2022   Procedure: LEFT ARTHROSCOPY KNEE LYSIS OF ADHESION AND MANIPULATION UNDER ANESTHESIA;  Surgeon: [name], MD;  Location: [place];  Service: Orthopedics;  Laterality: Left;   KNEE ARTHROSCOPY WITH ANTERIOR CRUCIATE LIGAMENT (ACL) REPAIR WITH HAMSTRING GRAFT Left 11/08/2021   Procedure: LEFT KNEE ANTERIOR CRUCIATE LIGAMENT RECONSTRUCTION WITH QUADRICEPS TENDON AUTOGRAFT;  Surgeon: [name], MD;  Location: [place];  Service: Orthopedics;  Laterality: Left;   KNEE ARTHROSCOPY WITH MENISCAL REPAIR  11/08/2021   Procedure: LEFT LATERAL MENISCAL REPAIR;  Surgeon: [name], MD;  Location: [place];  Service: Orthopedics;;   [place] EXTRACTION  2022   Patient Active Problem List   Diagnosis Date Noted   Peripheral tear of lateral meniscus of left knee as current injury    Arthrofibrosis of knee joint, left 01/31/2022   S/P repair of anterior cruciate ligament 01/31/2022   Morbid obesity with BMI of 40.0-44.9, adult ([place]) 01/31/2022   Hypertriglyceridemia 01/31/2022   Angioedema of lips- [place] 12/13/2021   Complex tear of lateral meniscus of left knee as current injury    History of orchitis 02/08/2021     PCP: Libby Maw, MD   REFERRING PROVIDER: [name], MD   REFERRING DIAG: [phone] (ICD-10-CM) - Rupture of anterior cruciate ligament of left knee, initial encounter       S83.272A (ICD-10-CM) - Complex tear of lateral meniscus of left knee as current injury,  initial encounter      Z98.890 (ICD-10-CM) - S/P left knee arthroscopy    THERAPY DIAG:  Left knee pain, unspecified chronicity   Stiffness of left knee, not elsewhere classified   Muscle weakness (generalized)   Difficulty in walking, not elsewhere classified   ONSET DATE: Injury 10/09/2021 / Surgery 11/08/2021   SUBJECTIVE:    SUBJECTIVE STATEMENT: ***  PERTINENT HISTORY: 11/08/2021 Left knee ACL reconstruction with quadriceps autograft and lateral meniscal repair.    02/02/2022: Lt knee lysis of adheresion and manipulation under anesthesia  Allergy to certain adhesives including tegaderm per pt report    PAIN:  Are you having pain? No NPRS scale: 1/10 at rest,  Pain location: right lateral knee  Pain orientation: Right  PAIN TYPE: aching Pain description: intermittent  Aggravating factors: use of the knee / falling off the bed  Relieving factors: rest     PRECAUTIONS: Other: per Dr. Eddie Dibbles ACL reconstruction with meniscal repair   WEIGHT BEARING RESTRICTIONS Yes      OCCUPATION: in culinary school     PLOF: Independent; Pt was able to perform all of his ADLs/IADLs and functional mobility skills without difficulty or limitations.  Pt was able to perform work activities.  Pt ambulated without an AD with good stability without difficulty.    PATIENT GOALS improved stability with leaning, to have the ability to run, to be able to bowl.  Return to PLOF.      OBJECTIVE:  *Unless otherwise  noted, objective measures collected previously*   LE ROM:  A/PROM Right  Left 02/03/2022 Left 02/16/2022 Left 03/08/2022  Knee flexion  93 AROM/ 97 AAROM/ 103PROM/ 106 PROM following MET 96 AROM, 102 PROM, 111 following MET 106 AROM, 111 PROM, 118 following MET  Knee extension  -1 AROM/ 0 PROM 2 AROM, 4 PROM 2 AROM, 4 PROM   (Blank rows = not tested)  LE MMT:  MMT Right  Left  Left 03/08/2022  Hip flexion  4+/5 5/5  Hip extension     Hip abduction     Knee flexion  5/5 5/5   Knee extension  4+/5 5/5  Ankle dorsiflexion  5/5   Ankle plantarflexion  5/5   Ankle inversion     Ankle eversion      (Blank rows = not tested)   FUNCTIONAL TESTS:  -Dead lift: Good form and depth with no pain   TODAY'S TREATMENT:  [facility] Adult PT Treatment:                                                DATE: 03/22/2022 Therapeutic Exercise: *** Manual Therapy: *** Neuromuscular re-ed: *** Therapeutic Activity: *** Modalities: *** Self Care: ***   [facility] Adult PT Treatment:                                                DATE: 03/17/2022 Therapeutic Exercise: Squat to chair with Airex pad on top with BTB Above Lt knee with lateral pull, cues to avoid genu varus 3x10 Standing hip adduction with BTB 2x10 Standing hip flexion with subsequent knee extension 3x10 on Lt Standing hip extension with 7# cable 2x10 on Lt Standing hamstring curl with 7# cable 2x10 on Lt Standing chair stretch x2 min on Lt Manual Therapy: N/A Neuromuscular re-ed: N/A Therapeutic Activity: N/A Modalities: GameReady vasopneumatic treatment at [place] F to Lt knee with Lt LE elevated x10 minutes Self Care: N/A   [facility] Adult PT Treatment:                                                DATE: 03/12/2022 A TX 116, P 122  Therapeutic Exercise: Recumbent bike x 5 min with seated lowered to max level Elliptical x 4 min L5 and ramp L5  Quad stretch 2 x 30 sec Bridge LLE SLS 1 x 30 with knee blocked in max flexion  Standing lunge onto airex pad with LLE backward 2 x 12 LAQ 2 x 15 5# with isometric hold x 3 second        Education details: Benefits of KT taping and if it causes any skin irritation to gently remove the tape while in the shower.  Person educated: Patient Education method: Explanation, Demonstration, Tactile cues, Verbal cues, and Handouts Education comprehension: verbalized understanding, returned demonstration, verbal cues required, tactile cues required, and needs further education      HOME EXERCISE PROGRAM: Access Code: [email or web address] URL: [email or web address] Date: 11/13/2021 Prepared by: Ronny Flurry        ASSESSMENT:   CLINICAL IMPRESSION: ***  Objective impairments include Abnormal gait,  decreased activity tolerance, decreased balance, decreased mobility, difficulty walking, decreased ROM, decreased strength, hypomobility, increased edema, impaired flexibility, and pain. These impairments are limiting patient from cleaning, community activity, driving, meal prep, occupation, laundry, shopping, school, and ambulation .     REHAB POTENTIAL: Good   CLINICAL DECISION MAKING: Stable/uncomplicated   EVALUATION COMPLEXITY: Low     GOALS:     SHORT TERM GOALS:   STG Name Target Date Goal status  1 Pt will be independent and compliant with HEP for improved pain, strength, and ROM.  Baseline:  12/11/2021 PARTIALLY MET  2 Pt will demo a good quad set and perform supine SLRs independently with brace for improved quad strength. Baseline:  11/27/2021 MET 02/08/2022  3 Pt will demo improved improved L knee extension PROM/AROM to 0/3 and flexion PROM to 60 deg for improved mobility and stiffness. Baseline: 12/04/2021 GOAL MET  4 Pt will demo improved improved L knee extension AROM to 0 deg and  flexion AAROM/PROM to 80/90 deg for improved mobility and stiffness. Baseline: 12/18/2021 GOAL MET  5 Pt will demo L knee AROM to be 0 - 120 deg for improved stiffness and performance of functional mobility.   Baseline: 03/08/2022: 106 AROM 01/22/2022 PROGRESSING   6 Pt will progress Wb'ing with gait per MD orders and protocol without adverse effects.  Baseline: 12/18/2021 GOAL MET  7 Pt will progress with closed chain exercises per protocol without adverse effects for improved functional strength and stability.  Baseline: 12/25/2021 GOAL MET  8 Pt will score 0-1 on the lateral step down test on a 4 inch step for improved quad eccentric control.   01/22/2022   MET  9  Pt will score 0-1 on the lateral step down test on a 6 inch step for improved quad eccentric control and performance of stairs.   02/06/2022 MET 02/08/2022  10 Pt will ambulate with a normalized heel to toe gait without limping without AD 01/22/2022 MET 02/08/2022    LONG TERM GOALS:    LTG Name Target Date Goal status  1 Pt will be able to perform his ADLs/IADLs and normal functional mobility skills without significant difficulty or pain.  Baseline: Pt reports some mild-to-moderate difficulty with getting into bed and crouching at work 03/05/2022   PROGRESSING  2 Pt will ambulate extended community distance without an AD without increased pain or difficulty.  Baseline: 03/08/2022: Pt reports ability to walk community distances without AD, but with moderate fatigue 02/19/2022 PROGRESSING  3 Pt will be able to perform his normal daily transfers without difficulty.  Baseline: 02/05/2022 ACHIEVED  4 Pt will demo good squatting form, symmetrical Wb'ing, and good knee alignment without significant pain for improved functional strength and tolerance with IADLs.  Baseline: 02/03/2022: ACHIEVED 02/19/2022 ACHIEVED  5 Pt will be able to perform stairs with a reciprocal gait without the rail with good control.  Baseline: 02/03/2022: ACHIEVED 03/05/2022 ACHIEVED  6 Pt will be able to perform all of his school activities and work activities without significant pain or difficulty.   Baseline: 03/08/2022: Pt reports difficulty with crouching at work and climbing ladders 02/19/2022 PROGRESSING  7 Pt will demo 5/5 L hip and knee strength for improved tolerance with and performance of functional mobility skills and to assist in returning to PLOF.   Baseline: 03/08/2022: 5/5 globally 03/05/2022 ACHIEVED    PLAN: PT FREQUENCY: 2-3x/week   PT DURATION: other: 6 weeks   PLANNED INTERVENTIONS: Therapeutic exercises, Therapeutic activity, Neuro Muscular re-education, Balance training,  Gait training, Patient/Family education, Joint  mobilization, Stair training, DME instructions, Aquatic Therapy, Electrical stimulation, Cryotherapy, Moist heat, Taping, and Manual therapy   PLAN FOR NEXT SESSION:  aggressive knee AROM, closed-chain strengthening and intro to plyometrics as able. Response to taping.  Allergy to certain adhesives including tegaderm.    Vanessa Mapleton, PT, DPT 03/22/22 10:10 AM

## 2022-03-23 ENCOUNTER — Ambulatory Visit: Payer: No Typology Code available for payment source

## 2022-03-23 NOTE — Therapy (Incomplete)
OUTPATIENT PHYSICAL THERAPY TREATMENT NOTE  Patient Name: Dustin Parks MRN: [id number] DOB:2001/02/09, 21 y.o., male Today's Date: 03/23/2022  PCP: Haydee Salter, MD REFERRING PROVIDER: Dr Donivan Scull                              Past Medical History:  Diagnosis Date   Allergy    Asthma    out grown now    Past Surgical History:  Procedure Laterality Date   KNEE ARTHROSCOPY Left 02/02/2022   Procedure: LEFT ARTHROSCOPY KNEE LYSIS OF ADHESION AND MANIPULATION UNDER ANESTHESIA;  Surgeon: [name], MD;  Location: [place];  Service: Orthopedics;  Laterality: Left;   KNEE ARTHROSCOPY WITH ANTERIOR CRUCIATE LIGAMENT (ACL) REPAIR WITH HAMSTRING GRAFT Left 11/08/2021   Procedure: LEFT KNEE ANTERIOR CRUCIATE LIGAMENT RECONSTRUCTION WITH QUADRICEPS TENDON AUTOGRAFT;  Surgeon: [name], MD;  Location: [place];  Service: Orthopedics;  Laterality: Left;   KNEE ARTHROSCOPY WITH MENISCAL REPAIR  11/08/2021   Procedure: LEFT LATERAL MENISCAL REPAIR;  Surgeon: [name], MD;  Location: [place];  Service: Orthopedics;;   [place] EXTRACTION  2022   Patient Active Problem List   Diagnosis Date Noted   Peripheral tear of lateral meniscus of left knee as current injury    Arthrofibrosis of knee joint, left 01/31/2022   S/P repair of anterior cruciate ligament 01/31/2022   Morbid obesity with BMI of 40.0-44.9, adult ([place]) 01/31/2022   Hypertriglyceridemia 01/31/2022   Angioedema of lips- [place] 12/13/2021   Complex tear of lateral meniscus of left knee as current injury    History of orchitis 02/08/2021     PCP: Libby Maw, MD   REFERRING PROVIDER: [name], MD   REFERRING DIAG: [phone] (ICD-10-CM) - Rupture of anterior cruciate ligament of left knee, initial encounter       S83.272A (ICD-10-CM) - Complex tear of lateral meniscus of left knee as current injury,  initial encounter      Z98.890 (ICD-10-CM) - S/P left knee arthroscopy    THERAPY DIAG:  Left knee pain, unspecified chronicity   Stiffness of left knee, not elsewhere classified   Muscle weakness (generalized)   Difficulty in walking, not elsewhere classified   ONSET DATE: Injury 10/09/2021 / Surgery 11/08/2021   SUBJECTIVE:    SUBJECTIVE STATEMENT: ***  PERTINENT HISTORY: 11/08/2021 Left knee ACL reconstruction with quadriceps autograft and lateral meniscal repair.    02/02/2022: Lt knee lysis of adheresion and manipulation under anesthesia  Allergy to certain adhesives including tegaderm per pt report    PAIN:  Are you having pain? No NPRS scale: 1/10 at rest,  Pain location: right lateral knee  Pain orientation: Right  PAIN TYPE: aching Pain description: intermittent  Aggravating factors: use of the knee / falling off the bed  Relieving factors: rest     PRECAUTIONS: Other: per Dr. Eddie Dibbles ACL reconstruction with meniscal repair   WEIGHT BEARING RESTRICTIONS Yes      OCCUPATION: in culinary school     PLOF: Independent; Pt was able to perform all of his ADLs/IADLs and functional mobility skills without difficulty or limitations.  Pt was able to perform work activities.  Pt ambulated without an AD with good stability without difficulty.    PATIENT GOALS improved stability with leaning, to have the ability to run, to be able to bowl.  Return to PLOF.      OBJECTIVE:  *Unless otherwise  noted, objective measures collected previously*   LE ROM:  A/PROM Right  Left 02/03/2022 Left 02/16/2022 Left 03/08/2022  Knee flexion  93 AROM/ 97 AAROM/ 103PROM/ 106 PROM following MET 96 AROM, 102 PROM, 111 following MET 106 AROM, 111 PROM, 118 following MET  Knee extension  -1 AROM/ 0 PROM 2 AROM, 4 PROM 2 AROM, 4 PROM   (Blank rows = not tested)  LE MMT:  MMT Right  Left  Left 03/08/2022  Hip flexion  4+/5 5/5  Hip extension     Hip abduction     Knee flexion  5/5 5/5   Knee extension  4+/5 5/5  Ankle dorsiflexion  5/5   Ankle plantarflexion  5/5   Ankle inversion     Ankle eversion      (Blank rows = not tested)   FUNCTIONAL TESTS:  -Dead lift: Good form and depth with no pain   TODAY'S TREATMENT:  [facility] Adult PT Treatment:                                                DATE: 03/24/2022 Therapeutic Exercise: *** Manual Therapy: *** Neuromuscular re-ed: *** Therapeutic Activity: *** Modalities: *** Self Care: ***   [facility] Adult PT Treatment:                                                DATE: 03/17/2022 Therapeutic Exercise: Squat to chair with Airex pad on top with BTB Above Lt knee with lateral pull, cues to avoid genu varus 3x10 Standing hip adduction with BTB 2x10 Standing hip flexion with subsequent knee extension 3x10 on Lt Standing hip extension with 7# cable 2x10 on Lt Standing hamstring curl with 7# cable 2x10 on Lt Standing chair stretch x2 min on Lt Manual Therapy: N/A Neuromuscular re-ed: N/A Therapeutic Activity: N/A Modalities: GameReady vasopneumatic treatment at [place] F to Lt knee with Lt LE elevated x10 minutes Self Care: N/A   [facility] Adult PT Treatment:                                                DATE: 03/12/2022 A TX 116, P 122  Therapeutic Exercise: Recumbent bike x 5 min with seated lowered to max level Elliptical x 4 min L5 and ramp L5  Quad stretch 2 x 30 sec Bridge LLE SLS 1 x 30 with knee blocked in max flexion  Standing lunge onto airex pad with LLE backward 2 x 12 LAQ 2 x 15 5# with isometric hold x 3 second        Education details: Benefits of KT taping and if it causes any skin irritation to gently remove the tape while in the shower.  Person educated: Patient Education method: Explanation, Demonstration, Tactile cues, Verbal cues, and Handouts Education comprehension: verbalized understanding, returned demonstration, verbal cues required, tactile cues required, and needs further education      HOME EXERCISE PROGRAM: Access Code: [email or web address] URL: [email or web address] Date: 11/13/2021 Prepared by: Ronny Flurry        ASSESSMENT:   CLINICAL IMPRESSION: ***  Objective impairments include Abnormal gait,  decreased activity tolerance, decreased balance, decreased mobility, difficulty walking, decreased ROM, decreased strength, hypomobility, increased edema, impaired flexibility, and pain. These impairments are limiting patient from cleaning, community activity, driving, meal prep, occupation, laundry, shopping, school, and ambulation .     REHAB POTENTIAL: Good   CLINICAL DECISION MAKING: Stable/uncomplicated   EVALUATION COMPLEXITY: Low     GOALS:     SHORT TERM GOALS:   STG Name Target Date Goal status  1 Pt will be independent and compliant with HEP for improved pain, strength, and ROM.  Baseline:  12/11/2021 PARTIALLY MET  2 Pt will demo a good quad set and perform supine SLRs independently with brace for improved quad strength. Baseline:  11/27/2021 MET 02/08/2022  3 Pt will demo improved improved L knee extension PROM/AROM to 0/3 and flexion PROM to 60 deg for improved mobility and stiffness. Baseline: 12/04/2021 GOAL MET  4 Pt will demo improved improved L knee extension AROM to 0 deg and  flexion AAROM/PROM to 80/90 deg for improved mobility and stiffness. Baseline: 12/18/2021 GOAL MET  5 Pt will demo L knee AROM to be 0 - 120 deg for improved stiffness and performance of functional mobility.   Baseline: 03/08/2022: 106 AROM 01/22/2022 PROGRESSING   6 Pt will progress Wb'ing with gait per MD orders and protocol without adverse effects.  Baseline: 12/18/2021 GOAL MET  7 Pt will progress with closed chain exercises per protocol without adverse effects for improved functional strength and stability.  Baseline: 12/25/2021 GOAL MET  8 Pt will score 0-1 on the lateral step down test on a 4 inch step for improved quad eccentric control.   01/22/2022   MET  9  Pt will score 0-1 on the lateral step down test on a 6 inch step for improved quad eccentric control and performance of stairs.   02/06/2022 MET 02/08/2022  10 Pt will ambulate with a normalized heel to toe gait without limping without AD 01/22/2022 MET 02/08/2022    LONG TERM GOALS:    LTG Name Target Date Goal status  1 Pt will be able to perform his ADLs/IADLs and normal functional mobility skills without significant difficulty or pain.  Baseline: Pt reports some mild-to-moderate difficulty with getting into bed and crouching at work 03/05/2022   PROGRESSING  2 Pt will ambulate extended community distance without an AD without increased pain or difficulty.  Baseline: 03/08/2022: Pt reports ability to walk community distances without AD, but with moderate fatigue 02/19/2022 PROGRESSING  3 Pt will be able to perform his normal daily transfers without difficulty.  Baseline: 02/05/2022 ACHIEVED  4 Pt will demo good squatting form, symmetrical Wb'ing, and good knee alignment without significant pain for improved functional strength and tolerance with IADLs.  Baseline: 02/03/2022: ACHIEVED 02/19/2022 ACHIEVED  5 Pt will be able to perform stairs with a reciprocal gait without the rail with good control.  Baseline: 02/03/2022: ACHIEVED 03/05/2022 ACHIEVED  6 Pt will be able to perform all of his school activities and work activities without significant pain or difficulty.   Baseline: 03/08/2022: Pt reports difficulty with crouching at work and climbing ladders 02/19/2022 PROGRESSING  7 Pt will demo 5/5 L hip and knee strength for improved tolerance with and performance of functional mobility skills and to assist in returning to PLOF.   Baseline: 03/08/2022: 5/5 globally 03/05/2022 ACHIEVED    PLAN: PT FREQUENCY: 2-3x/week   PT DURATION: other: 6 weeks   PLANNED INTERVENTIONS: Therapeutic exercises, Therapeutic activity, Neuro Muscular re-education, Balance training,  Gait training, Patient/Family education, Joint  mobilization, Stair training, DME instructions, Aquatic Therapy, Electrical stimulation, Cryotherapy, Moist heat, Taping, and Manual therapy   PLAN FOR NEXT SESSION:  aggressive knee AROM, closed-chain strengthening and intro to plyometrics as able. Response to taping.  Allergy to certain adhesives including tegaderm.    Vanessa , PT, DPT 03/23/22 9:16 AM

## 2022-03-24 ENCOUNTER — Ambulatory Visit: Payer: No Typology Code available for payment source

## 2022-03-26 ENCOUNTER — Ambulatory Visit: Payer: No Typology Code available for payment source | Admitting: Family Medicine

## 2022-03-26 ENCOUNTER — Ambulatory Visit: Payer: No Typology Code available for payment source | Admitting: Physical Therapy

## 2022-03-26 DIAGNOSIS — M6281 Muscle weakness (generalized): Secondary | ICD-10-CM

## 2022-03-26 DIAGNOSIS — M25662 Stiffness of left knee, not elsewhere classified: Secondary | ICD-10-CM

## 2022-03-26 DIAGNOSIS — M25562 Pain in left knee: Secondary | ICD-10-CM

## 2022-03-26 DIAGNOSIS — R262 Difficulty in walking, not elsewhere classified: Secondary | ICD-10-CM

## 2022-03-26 NOTE — Therapy (Signed)
OUTPATIENT PHYSICAL THERAPY TREATMENT NOTE  Patient Name: Dustin Parks MRN: [id number] DOB:2001/09/28, 21 y.o., male Today's Date: 03/26/2022  PCP: Haydee Salter, MD REFERRING PROVIDER: Dr Donivan Scull    PT End of Session - 03/26/22 1026     Visit Number [id number]    Number of Visits 50    Date for PT Re-Evaluation 04/26/22    Authorization Type MCE    Authorization Time Period 03/19/2022 - 06/19/2022    Authorization - Visit Number 1    Authorization - Number of Visits 16    PT Start Time 9021   pt arrived late today.   Activity Tolerance Patient tolerated treatment well    Behavior During Therapy [facility] for tasks assessed/performed                                      Past Medical History:  Diagnosis Date   Allergy    Asthma    out grown now    Past Surgical History:  Procedure Laterality Date   KNEE ARTHROSCOPY Left 02/02/2022   Procedure: LEFT ARTHROSCOPY KNEE LYSIS OF ADHESION AND MANIPULATION UNDER ANESTHESIA;  Surgeon: [name], MD;  Location: [place];  Service: Orthopedics;  Laterality: Left;   KNEE ARTHROSCOPY WITH ANTERIOR CRUCIATE LIGAMENT (ACL) REPAIR WITH HAMSTRING GRAFT Left 11/08/2021   Procedure: LEFT KNEE ANTERIOR CRUCIATE LIGAMENT RECONSTRUCTION WITH QUADRICEPS TENDON AUTOGRAFT;  Surgeon: [name], MD;  Location: [place];  Service: Orthopedics;  Laterality: Left;   KNEE ARTHROSCOPY WITH MENISCAL REPAIR  11/08/2021   Procedure: LEFT LATERAL MENISCAL REPAIR;  Surgeon: [name], MD;  Location: [place];  Service: Orthopedics;;   [place] EXTRACTION  2022   Patient Active Problem List   Diagnosis Date Noted   Peripheral tear of lateral meniscus of left knee as current injury    Arthrofibrosis of knee joint, left 01/31/2022   S/P repair of anterior cruciate ligament 01/31/2022   Morbid obesity with BMI of 40.0-44.9, adult ([place]) 01/31/2022    Hypertriglyceridemia 01/31/2022   Angioedema of lips- [place] 12/13/2021   Complex tear of lateral meniscus of left knee as current injury    History of orchitis 02/08/2021     PCP: Libby Maw, MD   REFERRING PROVIDER: [name], MD   REFERRING DIAG:  [phone] (ICD-10-CM) - Rupture of anterior cruciate ligament of left knee, initial encounter  S83.272A (ICD-10-CM) - Complex tear of lateral meniscus of left knee as current injury, initial encounter Z98.890 (ICD-10-CM) - S/P left knee arthroscopy    THERAPY DIAG:  Left knee pain, unspecified chronicity   Stiffness of left knee, not elsewhere classified   Muscle weakness (generalized)   Difficulty in walking, not elsewhere classified   ONSET DATE: Injury 10/09/2021 / Surgery 11/08/2021   SUBJECTIVE:    SUBJECTIVE STATEMENT: " The popping has really improved and its not as painfull now. I am having the knee still feel like is unstable and giving away. I did fall 2 on Saturday because of a slippery floor at home, my R leg slipped forward causing be to kneel unexpectedly."  PERTINENT HISTORY: 11/08/2021 Left knee ACL reconstruction with quadriceps autograft and lateral meniscal repair.    02/02/2022: Lt knee lysis of adheresion and manipulation under anesthesia  Allergy to certain adhesives including tegaderm per pt report    PAIN:  Are you having pain? No NPRS  scale: 1/10 at rest,  Pain location: right lateral knee  Pain orientation: Right  PAIN TYPE: aching Pain description: intermittent  Aggravating factors: use of the knee / falling off the bed  Relieving factors: rest     PRECAUTIONS: Other: per Dr. Eddie Dibbles ACL reconstruction with meniscal repair   WEIGHT BEARING RESTRICTIONS Yes      OCCUPATION: in culinary school     PLOF: Independent; Pt was able to perform all of his ADLs/IADLs and functional mobility skills without difficulty or limitations.  Pt was able to perform work activities.  Pt ambulated  without an AD with good stability without difficulty.    PATIENT GOALS improved stability with leaning, to have the ability to run, to be able to bowl.  Return to PLOF.      OBJECTIVE:  *Unless otherwise noted, objective measures collected previously*   LE ROM:  A/PROM Right  Left 02/03/2022 Left 02/16/2022 Left 03/08/2022  Knee flexion  93 AROM/ 97 AAROM/ 103PROM/ 106 PROM following MET 96 AROM, 102 PROM, 111 following MET 106 AROM, 111 PROM, 118 following MET  Knee extension  -1 AROM/ 0 PROM 2 AROM, 4 PROM 2 AROM, 4 PROM   (Blank rows = not tested)  LE MMT:  MMT Right  Left  Left 03/08/2022  Hip flexion  4+/5 5/5  Hip extension     Hip abduction     Knee flexion  5/5 5/5  Knee extension  4+/5 5/5  Ankle dorsiflexion  5/5   Ankle plantarflexion  5/5   Ankle inversion     Ankle eversion      (Blank rows = not tested)   FUNCTIONAL TESTS:  -Dead lift: Good form and depth with no pain   TODAY'S TREATMENT: [facility] Adult PT Treatment:                                                DATE: 03/26/2022 Knee ROM flexion 116 degrees Special testing  [place] appears to be stable, and [place] (-), [place] (inconclusive)  Therapeutic Exercise: Recumbent bike L 3 x 14min lowering seat at 3 min to promote knee flexion.  L SLS bridge with knee bent to max 3 x 15 with arms crossed chest Thomas test quad stretch PNF conctract/ relax 3 x 30 sec Leg press 60#, 2 x 15 con bil and ecc LLE.    Updated and reviewed HEP and discussed importance of consistency, especially as he is dropping himself from 2 x a week to 1 x a week.      [facility] Adult PT Treatment:                                                DATE: 03/17/2022 Therapeutic Exercise: Squat to chair with Airex pad on top with BTB Above Lt knee with lateral pull, cues to avoid genu varus 3x10 Standing hip adduction with BTB 2x10 Standing hip flexion with subsequent knee extension 3x10 on Lt Standing hip extension with 7# cable 2x10 on  Lt Standing hamstring curl with 7# cable 2x10 on Lt Standing chair stretch x2 min on Lt Manual Therapy: N/A Neuromuscular re-ed: N/A Therapeutic Activity: N/A Modalities: GameReady vasopneumatic treatment at 34d F to Lt knee with  Lt LE elevated x10 minutes Self Care: N/A   [facility] Adult PT Treatment:                                                DATE: 03/12/2022 A TX 116, P 122  Therapeutic Exercise: Recumbent bike x 5 min with seated lowered to max level Elliptical x 4 min L5 and ramp L5  Quad stretch 2 x 30 sec Bridge LLE SLS 1 x 30 with knee blocked in max flexion  Standing lunge onto airex pad with LLE backward 2 x 12 LAQ 2 x 15 5# with isometric hold x 3 second    Education details: Benefits of KT taping and if it causes any skin irritation to gently remove the tape while in the shower.  Person educated: Patient Education method: Explanation, Demonstration, Tactile cues, Verbal cues, and Handouts Education comprehension: verbalized understanding, returned demonstration, verbal cues required, tactile cues required, and needs further education     HOME EXERCISE PROGRAM: Access Code: [email or web address] URL: [email or web address] Date: 03/26/2022 Prepared by: Starr [place]  Exercises - Sidelying Hip Abduction  - 3 x daily - 7 x weekly - 3 sets - 20 reps - Small Range Straight Leg Raise  - 1 x daily - 7 x weekly - 3 sets - 20 reps - Supine Knee Flexion Wall Slide  - 1 x daily - 7 x weekly - 1 sets - 2 reps - 10 min hold - Standing Hamstring Curl with Resistance  - 1 x daily - 7 x weekly - 3 sets - 15 reps - Seated Hamstring Curl with Anchored Resistance  - 1 x daily - 7 x weekly - 3 sets - 15 reps - Figure 4 Bridge  - 1 x daily - 7 x weekly - 3 sets - 15 reps - Static Lunge  - 1 x daily - 7 x weekly - 3 sets - 15 reps - Sidestepping with Pelvic Floor Contraction and Resistance  - 1 x daily - 7 x weekly - 3 sets - 15 reps - Prone Quadriceps Stretch with Strap  - 2  x daily - 7 x weekly - 2 sets - 2 reps - 30 hold - Standing Quadriceps Stretch  - 2 x daily - 7 x weekly - 2 sets - 2 reps - 30 seconds hold      ASSESSMENT:   CLINICAL IMPRESSION: Pt arrives to session reporting no pain today but does note he is having popping in the knee and giving away and notes he has fallen 2 x on Saturday resulting in quick unexpected bending of the L knee. He continues to measure 116 degrees of active flexion despite noting limited consistency with his HEP. Continued working on gross hip / knee strengthening to maximize quad and glute activation to provide support. Pt reports due to his busy schedule he requested to drop to 1 x a week. I updated and reviewed importance that he consistently does his HEP and attends his visits to in order to progress with his current LOF. He reports he sees his MD on Friday to further assess his knee.   Objective impairments include Abnormal gait, decreased activity tolerance, decreased balance, decreased mobility, difficulty walking, decreased ROM, decreased strength, hypomobility, increased edema, impaired flexibility, and pain. These impairments are limiting patient from cleaning, community activity, driving, meal prep, occupation, laundry, shopping,  school, and ambulation .     REHAB POTENTIAL: Good   CLINICAL DECISION MAKING: Stable/uncomplicated   EVALUATION COMPLEXITY: Low     GOALS:     SHORT TERM GOALS:   STG Name Target Date Goal status  1 Pt will be independent and compliant with HEP for improved pain, strength, and ROM.  Baseline:  12/11/2021 PARTIALLY MET  2 Pt will demo a good quad set and perform supine SLRs independently with brace for improved quad strength. Baseline:  11/27/2021 MET 02/08/2022  3 Pt will demo improved improved L knee extension PROM/AROM to 0/3 and flexion PROM to 60 deg for improved mobility and stiffness. Baseline: 12/04/2021 GOAL MET  4 Pt will demo improved improved L knee extension AROM to 0 deg  and  flexion AAROM/PROM to 80/90 deg for improved mobility and stiffness. Baseline: 12/18/2021 GOAL MET  5 Pt will demo L knee AROM to be 0 - 120 deg for improved stiffness and performance of functional mobility.   Baseline: 03/08/2022: 106 AROM 01/22/2022 PROGRESSING   6 Pt will progress Wb'ing with gait per MD orders and protocol without adverse effects.  Baseline: 12/18/2021 GOAL MET  7 Pt will progress with closed chain exercises per protocol without adverse effects for improved functional strength and stability.  Baseline: 12/25/2021 GOAL MET  8 Pt will score 0-1 on the lateral step down test on a 4 inch step for improved quad eccentric control.   01/22/2022   MET  9 Pt will score 0-1 on the lateral step down test on a 6 inch step for improved quad eccentric control and performance of stairs.   02/06/2022 MET 02/08/2022  10 Pt will ambulate with a normalized heel to toe gait without limping without AD 01/22/2022 MET 02/08/2022    LONG TERM GOALS:    LTG Name Target Date Goal status  1 Pt will be able to perform his ADLs/IADLs and normal functional mobility skills without significant difficulty or pain.  Baseline: Pt reports some mild-to-moderate difficulty with getting into bed and crouching at work 03/05/2022   PROGRESSING  2 Pt will ambulate extended community distance without an AD without increased pain or difficulty.  Baseline: 03/08/2022: Pt reports ability to walk community distances without AD, but with moderate fatigue 02/19/2022 PROGRESSING  3 Pt will be able to perform his normal daily transfers without difficulty.  Baseline: 02/05/2022 ACHIEVED  4 Pt will demo good squatting form, symmetrical Wb'ing, and good knee alignment without significant pain for improved functional strength and tolerance with IADLs.  Baseline: 02/03/2022: ACHIEVED 02/19/2022 ACHIEVED  5 Pt will be able to perform stairs with a reciprocal gait without the rail with good control.  Baseline: 02/03/2022: ACHIEVED 03/05/2022  ACHIEVED  6 Pt will be able to perform all of his school activities and work activities without significant pain or difficulty.   Baseline: 03/08/2022: Pt reports difficulty with crouching at work and climbing ladders 02/19/2022 PROGRESSING  7 Pt will demo 5/5 L hip and knee strength for improved tolerance with and performance of functional mobility skills and to assist in returning to PLOF.   Baseline: 03/08/2022: 5/5 globally 03/05/2022 ACHIEVED    PLAN: PT FREQUENCY: 2-3x/week   PT DURATION: other: 6 weeks   PLANNED INTERVENTIONS: Therapeutic exercises, Therapeutic activity, Neuro Muscular re-education, Balance training, Gait training, Patient/Family education, Joint mobilization, Stair training, DME instructions, Aquatic Therapy, Electrical stimulation, Cryotherapy, Moist heat, Taping, and Manual therapy   PLAN FOR NEXT SESSION:  aggressive knee AROM, closed-chain strengthening and  intro to plyometrics as able. Response to taping.  Allergy to certain adhesives including tegaderm.    Aaliyha Mumford PT, DPT, LAT, ATC  03/26/22  10:27 AM

## 2022-03-28 ENCOUNTER — Encounter: Payer: Self-pay | Admitting: Physical Therapy

## 2022-03-28 ENCOUNTER — Ambulatory Visit: Payer: No Typology Code available for payment source | Admitting: Physical Therapy

## 2022-03-28 DIAGNOSIS — M25562 Pain in left knee: Secondary | ICD-10-CM | POA: Diagnosis not present

## 2022-03-28 DIAGNOSIS — M25662 Stiffness of left knee, not elsewhere classified: Secondary | ICD-10-CM

## 2022-03-28 DIAGNOSIS — M6281 Muscle weakness (generalized): Secondary | ICD-10-CM

## 2022-03-28 DIAGNOSIS — R262 Difficulty in walking, not elsewhere classified: Secondary | ICD-10-CM

## 2022-03-28 NOTE — Therapy (Signed)
OUTPATIENT PHYSICAL THERAPY TREATMENT NOTE  Patient Name: Dustin Parks MRN: [id number] DOB:[birth year], 21 y.o., male Today's Date: 03/28/2022  PCP: Haydee Salter, MD REFERRING PROVIDER: Dr Donivan Scull    PT End of Session - 03/28/22 1025     Visit Number [id number]    Number of Visits 50    Date for PT Re-Evaluation 04/26/22    Authorization Type MCE    Authorization Time Period 03/19/2022 - 06/19/2022    Authorization - Visit Number 2    Authorization - Number of Visits 16    PT Start Time 1018    PT Stop Time 1108    PT Time Calculation (min) 50 min    Activity Tolerance Patient tolerated treatment well    Behavior During Therapy [facility] for tasks assessed/performed                                       Past Medical History:  Diagnosis Date   Allergy    Asthma    out grown now    Past Surgical History:  Procedure Laterality Date   KNEE ARTHROSCOPY Left 02/02/2022   Procedure: LEFT ARTHROSCOPY KNEE LYSIS OF ADHESION AND MANIPULATION UNDER ANESTHESIA;  Surgeon: [name], MD;  Location: [place];  Service: Orthopedics;  Laterality: Left;   KNEE ARTHROSCOPY WITH ANTERIOR CRUCIATE LIGAMENT (ACL) REPAIR WITH HAMSTRING GRAFT Left 11/08/2021   Procedure: LEFT KNEE ANTERIOR CRUCIATE LIGAMENT RECONSTRUCTION WITH QUADRICEPS TENDON AUTOGRAFT;  Surgeon: [name], MD;  Location: [place];  Service: Orthopedics;  Laterality: Left;   KNEE ARTHROSCOPY WITH MENISCAL REPAIR  11/08/2021   Procedure: LEFT LATERAL MENISCAL REPAIR;  Surgeon: [name], MD;  Location: [place];  Service: Orthopedics;;   [place] EXTRACTION  2022   Patient Active Problem List   Diagnosis Date Noted   Peripheral tear of lateral meniscus of left knee as current injury    Arthrofibrosis of knee joint, left 01/31/2022   S/P repair of anterior cruciate ligament 01/31/2022   Morbid obesity with BMI of 40.0-44.9, adult  ([place]) 01/31/2022   Hypertriglyceridemia 01/31/2022   Angioedema of lips- [place] 12/13/2021   Complex tear of lateral meniscus of left knee as current injury    History of orchitis 02/08/2021     PCP: Libby Maw, MD   REFERRING PROVIDER: [name], MD   REFERRING DIAG:  [phone] (ICD-10-CM) - Rupture of anterior cruciate ligament of left knee, initial encounter  S83.272A (ICD-10-CM) - Complex tear of lateral meniscus of left knee as current injury, initial encounter Z98.890 (ICD-10-CM) - S/P left knee arthroscopy    THERAPY DIAG:  Left knee pain, unspecified chronicity   Stiffness of left knee, not elsewhere classified   Muscle weakness (generalized)   Difficulty in walking, not elsewhere classified   ONSET DATE: Injury 10/09/2021 / Surgery 11/08/2021   SUBJECTIVE:    SUBJECTIVE STATEMENT: " I am doing better today, I still feel alittle stiff but not bad. I think the buckling is a result of fatigue my knee buckled multiple times when I walked out the other day."  PERTINENT HISTORY: 11/08/2021 Left knee ACL reconstruction with quadriceps autograft and lateral meniscal repair.    02/02/2022: Lt knee lysis of adheresion and manipulation under anesthesia  Allergy to certain adhesives including tegaderm per pt report    PAIN:  Are you having pain? No NPRS scale:  0/10 at rest,  Pain location: right lateral knee  Pain orientation: Right  PAIN TYPE: aching Pain description: intermittent  Aggravating factors: use of the knee / falling off the bed  Relieving factors: rest     PRECAUTIONS: Other: per Dr. Eddie Dibbles ACL reconstruction with meniscal repair   WEIGHT BEARING RESTRICTIONS Yes      OCCUPATION: in culinary school     PLOF: Independent; Pt was able to perform all of his ADLs/IADLs and functional mobility skills without difficulty or limitations.  Pt was able to perform work activities.  Pt ambulated without an AD with good stability without difficulty.     PATIENT GOALS improved stability with leaning, to have the ability to run, to be able to bowl.  Return to PLOF.      OBJECTIVE:  *Unless otherwise noted, objective measures collected previously*   LE ROM:  A/PROM Right  Left 02/03/2022 Left 02/16/2022 Left 03/08/2022  Knee flexion  93 AROM/ 97 AAROM/ 103PROM/ 106 PROM following MET 96 AROM, 102 PROM, 111 following MET 106 AROM, 111 PROM, 118 following MET  Knee extension  -1 AROM/ 0 PROM 2 AROM, 4 PROM 2 AROM, 4 PROM   (Blank rows = not tested)  LE MMT:  MMT Right  Left  Left 03/08/2022  Hip flexion  4+/5 5/5  Hip extension     Hip abduction     Knee flexion  5/5 5/5  Knee extension  4+/5 5/5  Ankle dorsiflexion  5/5   Ankle plantarflexion  5/5   Ankle inversion     Ankle eversion      (Blank rows = not tested)   FUNCTIONAL TESTS:  -Dead lift: Good form and depth with no pain   TODAY'S TREATMENT: [facility] Adult PT Treatment:                                                DATE: 03/28/2022 120 flexion  Therapeutic Exercise: Elliptical L 5 x 5 min ramp L1 Slant board stretch 2 x 30 sec Standing quad stretch 2 x 30 [place] Standing hamstring stretch 2 x 30 second Lunge 2 x 10 touching back knee on to airex pad.  LAQ , LLE 1 x 10 progressing to con bil/ ecc LLE 1 x 10 15# Hamstring curl LLE only 2 x 15 with 25# Modalities: Ice pack x 10 min  Therapeutic Activity: Gait training ,brisk walking with max cues to avoid limping with increased step on involved and reduced step on uninvolved. 8 x 40 ft    [facility] Adult PT Treatment:                                                DATE: 03/26/2022 Knee ROM flexion 116 degrees Special testing  [place] appears to be stable, and [place] (-), [place] (inconclusive)  Therapeutic Exercise: Recumbent bike L 3 x 82mn lowering seat at 3 min to promote knee flexion.  L SLS bridge with knee bent to max 3 x 15 with arms crossed chest Thomas test quad stretch PNF conctract/ relax 3 x 30  sec Leg press 60#, 2 x 15 con bil and ecc LLE.    Updated and reviewed HEP and discussed importance of consistency,  especially as he is dropping himself from 2 x a week to 1 x a week.    [facility] Adult PT Treatment:                                                DATE: 03/17/2022 Therapeutic Exercise: Squat to chair with Airex pad on top with BTB Above Lt knee with lateral pull, cues to avoid genu varus 3x10 Standing hip adduction with BTB 2x10 Standing hip flexion with subsequent knee extension 3x10 on Lt Standing hip extension with 7# cable 2x10 on Lt Standing hamstring curl with 7# cable 2x10 on Lt Standing chair stretch x2 min on Lt Manual Therapy: N/A Neuromuscular re-ed: N/A Therapeutic Activity: N/A Modalities: GameReady vasopneumatic treatment at 34d F to Lt knee with Lt LE elevated x10 minutes Self Care: N/A     Education details: Benefits of KT taping and if it causes any skin irritation to gently remove the tape while in the shower.  Person educated: Patient Education method: Explanation, Demonstration, Tactile cues, Verbal cues, and Handouts Education comprehension: verbalized understanding, returned demonstration, verbal cues required, tactile cues required, and needs further education     HOME EXERCISE PROGRAM: Access Code: [email or web address] URL: [email or web address] Date: 03/26/2022 Prepared by: Starr Lake  Exercises - Sidelying Hip Abduction  - 3 x daily - 7 x weekly - 3 sets - 20 reps - Small Range Straight Leg Raise  - 1 x daily - 7 x weekly - 3 sets - 20 reps - Supine Knee Flexion Wall Slide  - 1 x daily - 7 x weekly - 1 sets - 2 reps - 10 min hold - Standing Hamstring Curl with Resistance  - 1 x daily - 7 x weekly - 3 sets - 15 reps - Seated Hamstring Curl with Anchored Resistance  - 1 x daily - 7 x weekly - 3 sets - 15 reps - Figure 4 Bridge  - 1 x daily - 7 x weekly - 3 sets - 15 reps - Static Lunge  - 1 x daily - 7 x weekly - 3 sets - 15  reps - Sidestepping with Pelvic Floor Contraction and Resistance  - 1 x daily - 7 x weekly - 3 sets - 15 reps - Prone Quadriceps Stretch with Strap  - 2 x daily - 7 x weekly - 2 sets - 2 reps - 30 hold - Standing Quadriceps Stretch  - 2 x daily - 7 x weekly - 2 sets - 2 reps - 30 seconds hold      ASSESSMENT:   CLINICAL IMPRESSION: Terreon arrives to session noting no pain with some report of stiffness. He was able to get 120 degrees flexion today so focused primarily on gross hip/ knee strengthening working into functional exercises. Overall he did well with all exercises noting his biggest challenge being with lunges. Time was taken to work on gait training as pt continues to demonstrate a antalgic gait pattern that is exacerbated with increased gait speed with increased step on involved side and reduced step on uninvolved. Pt sees MD Friday to assess his knee giving way.   Objective impairments include Abnormal gait, decreased activity tolerance, decreased balance, decreased mobility, difficulty walking, decreased ROM, decreased strength, hypomobility, increased edema, impaired flexibility, and pain. These impairments are limiting patient from cleaning, community activity, driving, meal prep, occupation, laundry,  shopping, school, and ambulation .     REHAB POTENTIAL: Good   CLINICAL DECISION MAKING: Stable/uncomplicated   EVALUATION COMPLEXITY: Low     GOALS:     SHORT TERM GOALS:   STG Name Target Date Goal status  1 Pt will be independent and compliant with HEP for improved pain, strength, and ROM.  Baseline:  12/11/2021 PARTIALLY MET  2 Pt will demo a good quad set and perform supine SLRs independently with brace for improved quad strength. Baseline:  11/27/2021 MET 02/08/2022  3 Pt will demo improved improved L knee extension PROM/AROM to 0/3 and flexion PROM to 60 deg for improved mobility and stiffness. Baseline: 12/04/2021 GOAL MET  4 Pt will demo improved improved L knee  extension AROM to 0 deg and  flexion AAROM/PROM to 80/90 deg for improved mobility and stiffness. Baseline: 12/18/2021 GOAL MET  5 Pt will demo L knee AROM to be 0 - 120 deg for improved stiffness and performance of functional mobility.   Baseline: 03/08/2022: 106 AROM 01/22/2022 PROGRESSING   6 Pt will progress Wb'ing with gait per MD orders and protocol without adverse effects.  Baseline: 12/18/2021 GOAL MET  7 Pt will progress with closed chain exercises per protocol without adverse effects for improved functional strength and stability.  Baseline: 12/25/2021 GOAL MET  8 Pt will score 0-1 on the lateral step down test on a 4 inch step for improved quad eccentric control.   01/22/2022   MET  9 Pt will score 0-1 on the lateral step down test on a 6 inch step for improved quad eccentric control and performance of stairs.   02/06/2022 MET 02/08/2022  10 Pt will ambulate with a normalized heel to toe gait without limping without AD 01/22/2022 MET 02/08/2022    LONG TERM GOALS:    LTG Name Target Date Goal status  1 Pt will be able to perform his ADLs/IADLs and normal functional mobility skills without significant difficulty or pain.  Baseline: Pt reports some mild-to-moderate difficulty with getting into bed and crouching at work 03/05/2022   PROGRESSING  2 Pt will ambulate extended community distance without an AD without increased pain or difficulty.  Baseline: 03/08/2022: Pt reports ability to walk community distances without AD, but with moderate fatigue 02/19/2022 PROGRESSING  3 Pt will be able to perform his normal daily transfers without difficulty.  Baseline: 02/05/2022 ACHIEVED  4 Pt will demo good squatting form, symmetrical Wb'ing, and good knee alignment without significant pain for improved functional strength and tolerance with IADLs.  Baseline: 02/03/2022: ACHIEVED 02/19/2022 ACHIEVED  5 Pt will be able to perform stairs with a reciprocal gait without the rail with good control.   Baseline: 02/03/2022: ACHIEVED 03/05/2022 ACHIEVED  6 Pt will be able to perform all of his school activities and work activities without significant pain or difficulty.   Baseline: 03/08/2022: Pt reports difficulty with crouching at work and climbing ladders 02/19/2022 PROGRESSING  7 Pt will demo 5/5 L hip and knee strength for improved tolerance with and performance of functional mobility skills and to assist in returning to PLOF.   Baseline: 03/08/2022: 5/5 globally 03/05/2022 ACHIEVED    PLAN: PT FREQUENCY: 2-3x/week   PT DURATION: other: 6 weeks   PLANNED INTERVENTIONS: Therapeutic exercises, Therapeutic activity, Neuro Muscular re-education, Balance training, Gait training, Patient/Family education, Joint mobilization, Stair training, DME instructions, Aquatic Therapy, Electrical stimulation, Cryotherapy, Moist heat, Taping, and Manual therapy   PLAN FOR NEXT SESSION:  aggressive knee AROM, closed-chain strengthening  and intro to plyometrics as able. Response to taping.  Allergy to certain adhesives including tegaderm.    Jesstin Studstill PT, DPT, LAT, ATC  03/28/22  11:20 AM

## 2022-03-30 ENCOUNTER — Encounter (HOSPITAL_BASED_OUTPATIENT_CLINIC_OR_DEPARTMENT_OTHER): Payer: No Typology Code available for payment source | Admitting: Orthopaedic Surgery

## 2022-03-30 ENCOUNTER — Encounter (HOSPITAL_BASED_OUTPATIENT_CLINIC_OR_DEPARTMENT_OTHER): Payer: Self-pay | Admitting: Orthopaedic Surgery

## 2022-04-03 ENCOUNTER — Ambulatory Visit: Payer: No Typology Code available for payment source

## 2022-04-03 DIAGNOSIS — M25562 Pain in left knee: Secondary | ICD-10-CM

## 2022-04-03 DIAGNOSIS — R262 Difficulty in walking, not elsewhere classified: Secondary | ICD-10-CM

## 2022-04-03 DIAGNOSIS — M25662 Stiffness of left knee, not elsewhere classified: Secondary | ICD-10-CM

## 2022-04-03 DIAGNOSIS — M6281 Muscle weakness (generalized): Secondary | ICD-10-CM

## 2022-04-03 NOTE — Therapy (Signed)
OUTPATIENT PHYSICAL THERAPY TREATMENT NOTE  Patient Name: Dustin Parks MRN: [id number] DOB:12/23/2000, 21 y.o., male Today's Date: 04/03/2022  PCP: Haydee Salter, MD REFERRING PROVIDER: Dr Donivan Scull    PT End of Session - 04/03/22 1007     Visit Number 37    Number of Visits 50    Date for PT Re-Evaluation 04/26/22    Authorization Type MCE    Authorization Time Period 03/19/2022 - 06/19/2022    Authorization - Visit Number 3    Authorization - Number of Visits 16    PT Start Time 1006    PT Stop Time 1044    PT Time Calculation (min) 38 min    Activity Tolerance Patient tolerated treatment well    Behavior During Therapy WFL for tasks assessed/performed                                        Past Medical History:  Diagnosis Date   Allergy    Asthma    out grown now    Past Surgical History:  Procedure Laterality Date   KNEE ARTHROSCOPY Left 02/02/2022   Procedure: LEFT ARTHROSCOPY KNEE LYSIS OF ADHESION AND MANIPULATION UNDER ANESTHESIA;  Surgeon: [name], MD;  Location: [place];  Service: Orthopedics;  Laterality: Left;   KNEE ARTHROSCOPY WITH ANTERIOR CRUCIATE LIGAMENT (ACL) REPAIR WITH HAMSTRING GRAFT Left 11/08/2021   Procedure: LEFT KNEE ANTERIOR CRUCIATE LIGAMENT RECONSTRUCTION WITH QUADRICEPS TENDON AUTOGRAFT;  Surgeon: [name], MD;  Location: [place];  Service: Orthopedics;  Laterality: Left;   KNEE ARTHROSCOPY WITH MENISCAL REPAIR  11/08/2021   Procedure: LEFT LATERAL MENISCAL REPAIR;  Surgeon: [name], MD;  Location: [place];  Service: Orthopedics;;   [place] EXTRACTION  2022   Patient Active Problem List   Diagnosis Date Noted   Peripheral tear of lateral meniscus of left knee as current injury    Arthrofibrosis of knee joint, left 01/31/2022   S/P repair of anterior cruciate ligament 01/31/2022   Morbid obesity with BMI of 40.0-44.9,  adult ([place]) 01/31/2022   Hypertriglyceridemia 01/31/2022   Angioedema of lips- [place] 12/13/2021   Complex tear of lateral meniscus of left knee as current injury    History of orchitis 02/08/2021     PCP: Libby Maw, MD   REFERRING PROVIDER: [name], MD   REFERRING DIAG:  [phone] (ICD-10-CM) - Rupture of anterior cruciate ligament of left knee, initial encounter  S83.272A (ICD-10-CM) - Complex tear of lateral meniscus of left knee as current injury, initial encounter Z98.890 (ICD-10-CM) - S/P left knee arthroscopy    THERAPY DIAG:  Left knee pain, unspecified chronicity   Stiffness of left knee, not elsewhere classified   Muscle weakness (generalized)   Difficulty in walking, not elsewhere classified   ONSET DATE: Injury 10/09/2021 / Surgery 11/08/2021   SUBJECTIVE:    SUBJECTIVE STATEMENT: Pt reports he has experienced increased Lt knee pain with change in weather recently.   PERTINENT HISTORY: 11/08/2021 Left knee ACL reconstruction with quadriceps autograft and lateral meniscal repair.    02/02/2022: Lt knee lysis of adheresion and manipulation under anesthesia  Allergy to certain adhesives including tegaderm per pt report    PAIN:  Are you having pain? No NPRS scale: 0/10 at rest,  Pain location: right lateral knee  Pain orientation: Right  PAIN TYPE: aching Pain description:  intermittent  Aggravating factors: use of the knee / falling off the bed  Relieving factors: rest     PRECAUTIONS: Other: per Dr. Eddie Dibbles ACL reconstruction with meniscal repair   WEIGHT BEARING RESTRICTIONS Yes      OCCUPATION: in culinary school     PLOF: Independent; Pt was able to perform all of his ADLs/IADLs and functional mobility skills without difficulty or limitations.  Pt was able to perform work activities.  Pt ambulated without an AD with good stability without difficulty.    PATIENT GOALS improved stability with leaning, to have the ability to run, to  be able to bowl.  Return to PLOF.      OBJECTIVE:  *Unless otherwise noted, objective measures collected previously*   LE ROM:  A/PROM Right  Left 02/03/2022 Left 02/16/2022 Left 03/08/2022  Knee flexion  93 AROM/ 97 AAROM/ 103PROM/ 106 PROM following MET 96 AROM, 102 PROM, 111 following MET 106 AROM, 111 PROM, 118 following MET  Knee extension  -1 AROM/ 0 PROM 2 AROM, 4 PROM 2 AROM, 4 PROM   (Blank rows = not tested)  LE MMT:  MMT Right  Left  Left 03/08/2022  Hip flexion  4+/5 5/5  Hip extension     Hip abduction     Knee flexion  5/5 5/5  Knee extension  4+/5 5/5  Ankle dorsiflexion  5/5   Ankle plantarflexion  5/5   Ankle inversion     Ankle eversion      (Blank rows = not tested)   FUNCTIONAL TESTS:  -Dead lift: Good form and depth with no pain   TODAY'S TREATMENT:  [facility] Adult PT Treatment:                                                DATE: 04/03/2022 Therapeutic Exercise: Exercise bike x5 minutes with progressive lowering while collecting subjective information [place] split squats with GTB pulling Lt knee into adduction 2x10 on Lt 8-inch box drop into squat, followed by overhead reach and heel raise 2x10 SLR with abduction 2x10 on Lt Manual Therapy: PROM into flexion x5 with 30sec hold at end range Knee flexion contract/ counter-contract MET x5 with 30-sec hold at end range Neuromuscular re-ed: N/A Therapeutic Activity: N/A Modalities: N/A Self Care: N/A   [facility] Adult PT Treatment:                                                DATE: 03/28/2022 120 flexion  Therapeutic Exercise: Elliptical L 5 x 5 min ramp L1 Slant board stretch 2 x 30 sec Standing quad stretch 2 x 30 [place] Standing hamstring stretch 2 x 30 second Lunge 2 x 10 touching back knee on to airex pad.  LAQ , LLE 1 x 10 progressing to con bil/ ecc LLE 1 x 10 15# Hamstring curl LLE only 2 x 15 with 25# Modalities: Ice pack x 10 min  Therapeutic Activity: Gait training ,brisk walking with  max cues to avoid limping with increased step on involved and reduced step on uninvolved. 8 x 40 ft    [facility] Adult PT Treatment:  DATE: 03/26/2022 Knee ROM flexion 116 degrees Special testing  Lachmans appears to be stable, and [place]mur[place] (-), [place] (inconclusive)  Therapeutic Exercise: Recumbent bike L 3 x 74mn lowering seat at 3 min to promote knee flexion.  L SLS bridge with knee bent to max 3 x 15 with arms crossed chest Thomas test quad stretch PNF conctract/ relax 3 x 30 sec Leg press 60#, 2 x 15 con bil and ecc LLE.    Updated and reviewed HEP and discussed importance of consistency, especially as he is dropping himself from 2 x a week to 1 x a week.        Education details: Benefits of KT taping and if it causes any skin irritation to gently remove the tape while in the shower.  Person educated: Patient Education method: Explanation, Demonstration, Tactile cues, Verbal cues, and Handouts Education comprehension: verbalized understanding, returned demonstration, verbal cues required, tactile cues required, and needs further education     HOME EXERCISE PROGRAM: Access Code: [email or web address]: [email or web address] Date: 03/26/2022 Prepared by: [place] Exercises - Sidelying Hip Abduction  - 3 x daily - 7 x weekly - 3 sets - 20 reps - Small Range Straight Leg Raise  - 1 x daily - 7 x weekly - 3 sets - 20 reps - Supine Knee Flexion Wall Slide  - 1 x daily - 7 x weekly - 1 sets - 2 reps - 10 min hold - Standing Hamstring Curl with Resistance  - 1 x daily - 7 x weekly - 3 sets - 15 reps - Seated Hamstring Curl with Anchored Resistance  - 1 x daily - 7 x weekly - 3 sets - 15 reps - Figure 4 Bridge  - 1 x daily - 7 x weekly - 3 sets - 15 reps - Static Lunge  - 1 x daily - 7 x weekly - 3 sets - 15 reps - Sidestepping with Pelvic Floor Contraction and Resistance  - 1 x daily - 7 x weekly - 3 sets - 15 reps -  Prone Quadriceps Stretch with Strap  - 2 x daily - 7 x weekly - 2 sets - 2 reps - 30 hold - Standing Quadriceps Stretch  - 2 x daily - 7 x weekly - 2 sets - 2 reps - 30 seconds hold      ASSESSMENT:   CLINICAL IMPRESSION: Pt responded well to progressed quad reinforcement and plyometric exercise today. He continues to see retained/ improved knee AROM and will continue to benefit from skilled PT to address his primary impairments and return to his prior level of function with less limitation.  Objective impairments include Abnormal gait, decreased activity tolerance, decreased balance, decreased mobility, difficulty walking, decreased ROM, decreased strength, hypomobility, increased edema, impaired flexibility, and pain. These impairments are limiting patient from cleaning, community activity, driving, meal prep, occupation, laundry, shopping, school, and ambulation .     REHAB POTENTIAL: Good   CLINICAL DECISION MAKING: Stable/uncomplicated   EVALUATION COMPLEXITY: Low     GOALS:     SHORT TERM GOALS:   STG Name Target Date Goal status  1 Pt will be independent and compliant with HEP for improved pain, strength, and ROM.  Baseline:  12/11/2021 PARTIALLY MET  2 Pt will demo a good quad set and perform supine SLRs independently with brace for improved quad strength. Baseline:  11/27/2021 MET 02/08/2022  3 Pt will demo improved improved L knee extension PROM/AROM to 0/3 and flexion PROM to 60  deg for improved mobility and stiffness. Baseline: 12/04/2021 GOAL MET  4 Pt will demo improved improved L knee extension AROM to 0 deg and  flexion AAROM/PROM to 80/90 deg for improved mobility and stiffness. Baseline: 12/18/2021 GOAL MET  5 Pt will demo L knee AROM to be 0 - 120 deg for improved stiffness and performance of functional mobility.   Baseline: 03/08/2022: 106 AROM 01/22/2022 PROGRESSING   6 Pt will progress Wb'ing with gait per MD orders and protocol without adverse effects.  Baseline:  12/18/2021 GOAL MET  7 Pt will progress with closed chain exercises per protocol without adverse effects for improved functional strength and stability.  Baseline: 12/25/2021 GOAL MET  8 Pt will score 0-1 on the lateral step down test on a 4 inch step for improved quad eccentric control.   01/22/2022   MET  9 Pt will score 0-1 on the lateral step down test on a 6 inch step for improved quad eccentric control and performance of stairs.   02/06/2022 MET 02/08/2022  10 Pt will ambulate with a normalized heel to toe gait without limping without AD 01/22/2022 MET 02/08/2022    LONG TERM GOALS:    LTG Name Target Date Goal status  1 Pt will be able to perform his ADLs/IADLs and normal functional mobility skills without significant difficulty or pain.  Baseline: Pt reports some mild-to-moderate difficulty with getting into bed and crouching at work 03/05/2022   PROGRESSING  2 Pt will ambulate extended community distance without an AD without increased pain or difficulty.  Baseline: 03/08/2022: Pt reports ability to walk community distances without AD, but with moderate fatigue 02/19/2022 PROGRESSING  3 Pt will be able to perform his normal daily transfers without difficulty.  Baseline: 02/05/2022 ACHIEVED  4 Pt will demo good squatting form, symmetrical Wb'ing, and good knee alignment without significant pain for improved functional strength and tolerance with IADLs.  Baseline: 02/03/2022: ACHIEVED 02/19/2022 ACHIEVED  5 Pt will be able to perform stairs with a reciprocal gait without the rail with good control.  Baseline: 02/03/2022: ACHIEVED 03/05/2022 ACHIEVED  6 Pt will be able to perform all of his school activities and work activities without significant pain or difficulty.   Baseline: 03/08/2022: Pt reports difficulty with crouching at work and climbing ladders 02/19/2022 PROGRESSING  7 Pt will demo 5/5 L hip and knee strength for improved tolerance with and performance of functional mobility skills and to assist  in returning to PLOF.   Baseline: 03/08/2022: 5/5 globally 03/05/2022 ACHIEVED    PLAN: PT FREQUENCY: 2-3x/week   PT DURATION: other: 6 weeks   PLANNED INTERVENTIONS: Therapeutic exercises, Therapeutic activity, Neuro Muscular re-education, Balance training, Gait training, Patient/Family education, Joint mobilization, Stair training, DME instructions, Aquatic Therapy, Electrical stimulation, Cryotherapy, Moist heat, Taping, and Manual therapy   PLAN FOR NEXT SESSION:  aggressive knee AROM, closed-chain strengthening and intro to plyometrics as able. Response to taping.  Allergy to certain adhesives including tegaderm.    Vanessa Christoval, PT, DPT 04/03/22 10:44 AM

## 2022-04-04 ENCOUNTER — Encounter (HOSPITAL_BASED_OUTPATIENT_CLINIC_OR_DEPARTMENT_OTHER): Payer: No Typology Code available for payment source | Admitting: Orthopaedic Surgery

## 2022-04-09 ENCOUNTER — Encounter (HOSPITAL_BASED_OUTPATIENT_CLINIC_OR_DEPARTMENT_OTHER): Payer: No Typology Code available for payment source | Admitting: Orthopaedic Surgery

## 2022-04-10 ENCOUNTER — Telehealth: Payer: Self-pay

## 2022-04-10 ENCOUNTER — Ambulatory Visit: Payer: No Typology Code available for payment source | Attending: Orthopaedic Surgery

## 2022-04-10 DIAGNOSIS — M6281 Muscle weakness (generalized): Secondary | ICD-10-CM | POA: Insufficient documentation

## 2022-04-10 DIAGNOSIS — M25662 Stiffness of left knee, not elsewhere classified: Secondary | ICD-10-CM | POA: Insufficient documentation

## 2022-04-10 DIAGNOSIS — R262 Difficulty in walking, not elsewhere classified: Secondary | ICD-10-CM | POA: Insufficient documentation

## 2022-04-10 DIAGNOSIS — M25562 Pain in left knee: Secondary | ICD-10-CM | POA: Insufficient documentation

## 2022-04-10 NOTE — Telephone Encounter (Signed)
Left message for pt regarding his first no-show. Discussed attendance policy and provided clinic phone number.

## 2022-04-13 ENCOUNTER — Ambulatory Visit (INDEPENDENT_AMBULATORY_CARE_PROVIDER_SITE_OTHER): Payer: No Typology Code available for payment source | Admitting: Orthopaedic Surgery

## 2022-04-13 DIAGNOSIS — M24662 Ankylosis, left knee: Secondary | ICD-10-CM

## 2022-04-13 MED ORDER — LIDOCAINE HCL 1 % IJ SOLN
4.0000 mL | INTRAMUSCULAR | Status: AC | PRN
  Administered 2022-04-13: 4 mL

## 2022-04-13 MED ORDER — TRIAMCINOLONE ACETONIDE 40 MG/ML IJ SUSP
80.0000 mg | INTRAMUSCULAR | Status: AC | PRN
  Administered 2022-04-13: 80 mg via INTRA_ARTICULAR

## 2022-04-13 NOTE — Progress Notes (Signed)
Chief Complaint: Postop follow-up    Left knee ACL reconstruction with quadriceps tendon autograft and lateral meniscal repair done January 4th, 2023 with manipulation under anesthesia and lysis of adhesions on 02/02/22  History of Present Illness:   04/13/2022: Presents today for follow-up status post the above procedure.  He is doing initially quite well after the manipulation and lysis of adhesions.  He did have a fall 2 weeks prior.  At times the pain about the lateral aspect of the knee.  He is here today for further assessment and evaluation.  He is experiencing a 1 out of 10 pain at all times with regard to the left knee.  Surgical History:   None  PMH/PSH/Family History/Social History/Meds/Allergies:    Past Medical History:  Diagnosis Date  . Allergy   . Asthma    out grown now    Past Surgical History:  Procedure Laterality Date  . KNEE ARTHROSCOPY Left 02/02/2022   Procedure: LEFT ARTHROSCOPY KNEE LYSIS OF ADHESION AND MANIPULATION UNDER ANESTHESIA;  Surgeon: Huel Cote, MD;  Location: [facility];  Service: Orthopedics;  Laterality: Left;  . KNEE ARTHROSCOPY WITH ANTERIOR CRUCIATE LIGAMENT (ACL) REPAIR WITH HAMSTRING GRAFT Left 11/08/2021   Procedure: LEFT KNEE ANTERIOR CRUCIATE LIGAMENT RECONSTRUCTION WITH QUADRICEPS TENDON AUTOGRAFT;  Surgeon: Huel Cote, MD;  Location: [facility];  Service: Orthopedics;  Laterality: Left;  . KNEE ARTHROSCOPY WITH MENISCAL REPAIR  11/08/2021   Procedure: LEFT LATERAL MENISCAL REPAIR;  Surgeon: Huel Cote, MD;  Location: [facility];  Service: Orthopedics;;  . WISDOM TOOTH EXTRACTION  2022   Social History   Socioeconomic History  . Marital status: Single    Spouse name: Not on file  . Number of children: 1  . Years of education: Not on file  . Highest education level: Not on file  Occupational History  . Occupation: Student    Comment: [employer]   Tobacco Use  . Smoking status: Never  . Smokeless tobacco: Never  Vaping Use  . Vaping Use: Never used  Substance and Sexual Activity  . Alcohol use: Not Currently    Comment: rare  . Drug use: Yes    Types: Marijuana    Comment: Delta 8 THC  . Sexual activity: Yes  Other Topics Concern  . Not on file  Social History Narrative  . Not on file   Social Determinants of Health   Financial Resource Strain: Not on file  Food Insecurity: Not on file  Transportation Needs: Not on file  Physical Activity: Not on file  Stress: Not on file  Social Connections: Not on file   Family History  Problem Relation Age of Onset  . Obesity Mother   . Hyperlipidemia Father   . Obesity Father   . Obesity Maternal Grandmother   . Stroke Maternal Grandfather   . Diabetes Paternal Grandmother    Allergies  Allergen Reactions  . Other Swelling    All citrus fruit with the exception of lime   Current Outpatient Medications  Medication Sig Dispense Refill  . EPINEPHrine 0.3 mg/0.3 mL IJ SOAJ injection Inject 0.3 mg into the muscle as needed for anaphylaxis. 2 each 1  . oxyCODONE (OXY IR/ROXICODONE) 5 MG immediate release tablet Take 1 tablet (5 mg total) by mouth every 4 (four) hours  as needed (severe pain). 20 tablet 0   No current facility-administered medications for this visit.   No results found.  Review of Systems:   A ROS was performed including pertinent positives and negatives as documented in the HPI.  Physical Exam :   Constitutional: NAD and appears stated age Neurological: Alert and oriented Psych: Appropriate affect and cooperative There were no vitals taken for this visit.   Comprehensive Musculoskeletal Exam:     Incisions are well-healing.  Range of motion is from 0 to 125 degrees.  There is negative Lachman decreased quadriceps bulk although there is good tone.  There is new lateral joint line tenderness.  He is also experiencing pain about the fibular head and the  anterior lateral tibia distally Imaging:    I personally reviewed and interpreted the radiographs.   Assessment:   21 year old male status post ACL reconstruction with quadriceps tendon autograft as well as lateral meniscal tear repair.  Second look arthroscopy was lysis of adhesions showed an intact and healed lateral meniscus as well as an ACL graft that was matured and intact.  Unfortunately does have some new lateral joint line tenderness after a fall 2 weeks prior.  At this time I am somewhat curious at this time whether or not this is an intra-articular versus an extra-articular source of pain as he is experiencing the majority of his pain about the lateral aspect of the calf.  I described that this is less common with regard to an intra-articular source of pain.  To that effect I discussed with him possibilities.  We discussed an MRI of the left knee versus an injection of the left knee.  I do believe that there would be some value in a steroid injection particularly diagnostically if his pain is not perfectly along the joint line and is more so about the lateral aspect of the fibula Distally.  After discussion of this he has elected for an intra-articular injection of the left knee.  I will plan to see him back in 1 week and if his symptoms have not improved we will potentially consider an MRI left knee. Plan :    -Return to clinic in 1 week     Procedure Note  Patient: Dustin Parks             Date of Birth: 2001/05/10           MRN: [id number]             Visit Date: 04/13/2022  Procedures: Visit Diagnoses: No diagnosis found.  Large Joint Inj: R knee on 04/13/2022 3:16 PM Indications: pain Details: 22 G 1.5 in needle, ultrasound-guided anterior approach  Arthrogram: No  Medications: 4 mL lidocaine 1 %; 80 mg triamcinolone acetonide 40 MG/ML Outcome: tolerated well, no immediate complications Procedure, treatment alternatives, risks and benefits explained, specific risks  discussed. Consent was given by the patient. Immediately prior to procedure a time out was called to verify the correct patient, procedure, equipment, support staff and site/side marked as required. Patient was prepped and draped in the usual sterile fashion.            I personally saw and evaluated the patient, and participated in the management and treatment plan.  Huel Cote, MD Attending Physician, Orthopedic Surgery  This document was dictated using Dragon voice recognition software. A reasonable attempt at proof reading has been made to minimize errors.

## 2022-04-14 ENCOUNTER — Ambulatory Visit: Payer: No Typology Code available for payment source

## 2022-04-14 DIAGNOSIS — M25662 Stiffness of left knee, not elsewhere classified: Secondary | ICD-10-CM | POA: Diagnosis present

## 2022-04-14 DIAGNOSIS — R262 Difficulty in walking, not elsewhere classified: Secondary | ICD-10-CM

## 2022-04-14 DIAGNOSIS — M6281 Muscle weakness (generalized): Secondary | ICD-10-CM

## 2022-04-14 DIAGNOSIS — M25562 Pain in left knee: Secondary | ICD-10-CM

## 2022-04-14 NOTE — Therapy (Signed)
OUTPATIENT PHYSICAL THERAPY TREATMENT NOTE  Patient Name: Dustin Parks MRN: [id number] DOB:October 29, 2001, 21 y.o., male Today's Date: 04/14/2022  PCP: Haydee Salter, MD REFERRING PROVIDER: Dr Donivan Scull    PT End of Session - 04/14/22 0948     Visit Number 38    Number of Visits 50    Date for PT Re-Evaluation 04/26/22    Authorization Type MCE    Authorization Time Period 03/19/2022 - 06/19/2022    Authorization - Visit Number 4    Authorization - Number of Visits 16    PT Start Time 0948    PT Stop Time 1026    PT Time Calculation (min) 38 min    Activity Tolerance Patient tolerated treatment well    Behavior During Therapy [facility] for tasks assessed/performed                     Past Medical History:  Diagnosis Date   Allergy    Asthma    out grown now    Past Surgical History:  Procedure Laterality Date   KNEE ARTHROSCOPY Left 02/02/2022   Procedure: LEFT ARTHROSCOPY KNEE LYSIS OF ADHESION AND MANIPULATION UNDER ANESTHESIA;  Surgeon: [name], MD;  Location: [place];  Service: Orthopedics;  Laterality: Left;   KNEE ARTHROSCOPY WITH ANTERIOR CRUCIATE LIGAMENT (ACL) REPAIR WITH HAMSTRING GRAFT Left 11/08/2021   Procedure: LEFT KNEE ANTERIOR CRUCIATE LIGAMENT RECONSTRUCTION WITH QUADRICEPS TENDON AUTOGRAFT;  Surgeon: [name], MD;  Location: [place];  Service: Orthopedics;  Laterality: Left;   KNEE ARTHROSCOPY WITH MENISCAL REPAIR  11/08/2021   Procedure: LEFT LATERAL MENISCAL REPAIR;  Surgeon: [name], MD;  Location: [place];  Service: Orthopedics;;   [place] EXTRACTION  2022   Patient Active Problem List   Diagnosis Date Noted   Peripheral tear of lateral meniscus of left knee as current injury    Arthrofibrosis of knee joint, left 01/31/2022   S/P repair of anterior cruciate ligament 01/31/2022   Morbid obesity with BMI of 40.0-44.9, adult ([place]) 01/31/2022    Hypertriglyceridemia 01/31/2022   Angioedema of lips- [place] 12/13/2021   Complex tear of lateral meniscus of left knee as current injury    History of orchitis 02/08/2021     PCP: Libby Maw, MD   REFERRING PROVIDER: [name], MD   REFERRING DIAG:  [phone] (ICD-10-CM) - Rupture of anterior cruciate ligament of left knee, initial encounter  S83.272A (ICD-10-CM) - Complex tear of lateral meniscus of left knee as current injury, initial encounter Z98.890 (ICD-10-CM) - S/P left knee arthroscopy    THERAPY DIAG:  Left knee pain, unspecified chronicity   Stiffness of left knee, not elsewhere classified   Muscle weakness (generalized)   Difficulty in walking, not elsewhere classified   ONSET DATE: Injury 10/09/2021 / Surgery 11/08/2021   SUBJECTIVE:    SUBJECTIVE STATEMENT: Pt reports no pain today. He reports that he had a follow-up with his orthopedic surgeon yesterday, where he received a corticosteroid injection. He reports this helped to decrease his pain a lot.  PERTINENT HISTORY: 11/08/2021 Left knee ACL reconstruction with quadriceps autograft and lateral meniscal repair.    02/02/2022: Lt knee lysis of adheresion and manipulation under anesthesia  Allergy to certain adhesives including tegaderm per pt report    PAIN:  Are you having pain? No NPRS scale: 0/10 at rest,  Pain location: right lateral knee  Pain orientation: Right  PAIN TYPE: aching Pain description: intermittent  Aggravating factors: use of the knee / falling off the bed  Relieving factors: rest     PRECAUTIONS: Other: per Dr. Eddie Dibbles ACL reconstruction with meniscal repair   WEIGHT BEARING RESTRICTIONS Yes      OCCUPATION: in culinary school     PLOF: Independent; Pt was able to perform all of his ADLs/IADLs and functional mobility skills without difficulty or limitations.  Pt was able to perform work activities.  Pt ambulated without an AD with good stability without difficulty.     PATIENT GOALS improved stability with leaning, to have the ability to run, to be able to bowl.  Return to PLOF.      OBJECTIVE:  *Unless otherwise noted, objective measures collected previously*   LE ROM:  A/PROM Right  Left 02/03/2022 Left 02/16/2022 Left 03/08/2022 Left 04/14/2022  Knee flexion  93 AROM/ 97 AAROM/ 103PROM/ 106 PROM following MET 96 AROM, 102 PROM, 111 following MET 106 AROM, 111 PROM, 118 following MET 117 AROM  Knee extension  -1 AROM/ 0 PROM 2 AROM, 4 PROM 2 AROM, 4 PROM 3 AROM   (Blank rows = not tested)  LE MMT:  MMT Right  Left  Left 03/08/2022  Hip flexion  4+/5 5/5  Hip extension     Hip abduction     Knee flexion  5/5 5/5  Knee extension  4+/5 5/5  Ankle dorsiflexion  5/5   Ankle plantarflexion  5/5   Ankle inversion     Ankle eversion      (Blank rows = not tested)   FUNCTIONAL TESTS:  -Dead lift: Good form and depth with no pain   TODAY'S TREATMENT:  [facility] Adult PT Treatment:                                                DATE: 04/14/2022 Therapeutic Exercise: [place] split squats with GTB pulling Lt knee into adduction, 15# kettlebell kettlebell in Lt hand, Rt UE support, 2x10 on Lt Squat hops 3x8 Side shuffle hops 3x20 Seated BIL hamstring curls with 45# cable 3x10 Seated BIL knee extension with 25# cable 3x10 Standing Lt quad stretch x2 min Manual Therapy: N/A Neuromuscular re-ed: N/A Therapeutic Activity: N/A Modalities: N/A Self Care: N/A   [facility] Adult PT Treatment:                                                DATE: 04/03/2022 Therapeutic Exercise: Exercise bike x5 minutes with progressive lowering while collecting subjective information [place] split squats with GTB pulling Lt knee into adduction 2x10 on Lt 8-inch box drop into squat, followed by overhead reach and heel raise 2x10 SLR with abduction 2x10 on Lt Manual Therapy: PROM into flexion x5 with 30sec hold at end range Knee flexion contract/ counter-contract  MET x5 with 30-sec hold at end range Neuromuscular re-ed: N/A Therapeutic Activity: N/A Modalities: N/A Self Care: N/A   [facility] Adult PT Treatment:                                                DATE: 03/28/2022 120 flexion  Therapeutic Exercise: Elliptical L 5 x 5 min ramp L1 Slant board stretch 2 x 30 sec Standing quad stretch 2 x 30 [place] Standing hamstring stretch 2 x 30 second Lunge 2 x 10 touching back knee on to airex pad.  LAQ , LLE 1 x 10 progressing to con bil/ ecc LLE 1 x 10 15# Hamstring curl LLE only 2 x 15 with 25# Modalities: Ice pack x 10 min  Therapeutic Activity: Gait training ,brisk walking with max cues to avoid limping with increased step on involved and reduced step on uninvolved. 8 x 40 ft        Education details: Benefits of KT taping and if it causes any skin irritation to gently remove the tape while in the shower.  Person educated: Patient Education method: Explanation, Demonstration, Tactile cues, Verbal cues, and Handouts Education comprehension: verbalized understanding, returned demonstration, verbal cues required, tactile cues required, and needs further education     HOME EXERCISE PROGRAM: Access Code: [email or web address] URL: [email or web address] Date: 03/26/2022 Prepared by: Starr [place]  Exercises - Sidelying Hip Abduction  - 3 x daily - 7 x weekly - 3 sets - 20 reps - Small Range Straight Leg Raise  - 1 x daily - 7 x weekly - 3 sets - 20 reps - Supine Knee Flexion Wall Slide  - 1 x daily - 7 x weekly - 1 sets - 2 reps - 10 min hold - Standing Hamstring Curl with Resistance  - 1 x daily - 7 x weekly - 3 sets - 15 reps - Seated Hamstring Curl with Anchored Resistance  - 1 x daily - 7 x weekly - 3 sets - 15 reps - Figure 4 Bridge  - 1 x daily - 7 x weekly - 3 sets - 15 reps - Static Lunge  - 1 x daily - 7 x weekly - 3 sets - 15 reps - Sidestepping with Pelvic Floor Contraction and Resistance  - 1 x daily - 7 x weekly - 3 sets  - 15 reps - Prone Quadriceps Stretch with Strap  - 2 x daily - 7 x weekly - 2 sets - 2 reps - 30 hold - Standing Quadriceps Stretch  - 2 x daily - 7 x weekly - 2 sets - 2 reps - 30 seconds hold      ASSESSMENT:   CLINICAL IMPRESSION: Pt responded well to progressed plyometric exercises today, demonstrating good form and no pain. He also demonstrates improved Lt knee AROM today upon re-assessment. Cortiocosteroid injection with subsequent pain reduction may be contributory to these findings. The pt will continue to benefit from skilled PT to address his primary impairments and return to his prior level of function with less limitation.   Objective impairments include Abnormal gait, decreased activity tolerance, decreased balance, decreased mobility, difficulty walking, decreased ROM, decreased strength, hypomobility, increased edema, impaired flexibility, and pain. These impairments are limiting patient from cleaning, community activity, driving, meal prep, occupation, laundry, shopping, school, and ambulation .         GOALS:     SHORT TERM GOALS:   STG Name Target Date Goal status  1 Pt will be independent and compliant with HEP for improved pain, strength, and ROM.  Baseline:  12/11/2021 PARTIALLY MET  2 Pt will demo a good quad set and perform supine SLRs independently with brace for improved quad strength. Baseline:  11/27/2021 MET 02/08/2022  3 Pt will demo improved improved L knee extension PROM/AROM to 0/3 and flexion PROM  to 60 deg for improved mobility and stiffness. Baseline: 12/04/2021 GOAL MET  4 Pt will demo improved improved L knee extension AROM to 0 deg and  flexion AAROM/PROM to 80/90 deg for improved mobility and stiffness. Baseline: 12/18/2021 GOAL MET  5 Pt will demo L knee AROM to be 0 - 120 deg for improved stiffness and performance of functional mobility.   Baseline: 03/08/2022: 106 AROM 01/22/2022 PROGRESSING   6 Pt will progress Wb'ing with gait per MD orders and  protocol without adverse effects.  Baseline: 12/18/2021 GOAL MET  7 Pt will progress with closed chain exercises per protocol without adverse effects for improved functional strength and stability.  Baseline: 12/25/2021 GOAL MET  8 Pt will score 0-1 on the lateral step down test on a 4 inch step for improved quad eccentric control.   01/22/2022   MET  9 Pt will score 0-1 on the lateral step down test on a 6 inch step for improved quad eccentric control and performance of stairs.   02/06/2022 MET 02/08/2022  10 Pt will ambulate with a normalized heel to toe gait without limping without AD 01/22/2022 MET 02/08/2022    LONG TERM GOALS:    LTG Name Target Date Goal status  1 Pt will be able to perform his ADLs/IADLs and normal functional mobility skills without significant difficulty or pain.  Baseline: Pt reports some mild-to-moderate difficulty with getting into bed and crouching at work 03/05/2022   PROGRESSING  2 Pt will ambulate extended community distance without an AD without increased pain or difficulty.  Baseline: 03/08/2022: Pt reports ability to walk community distances without AD, but with moderate fatigue 02/19/2022 PROGRESSING  3 Pt will be able to perform his normal daily transfers without difficulty.  Baseline: 02/05/2022 ACHIEVED  4 Pt will demo good squatting form, symmetrical Wb'ing, and good knee alignment without significant pain for improved functional strength and tolerance with IADLs.  Baseline: 02/03/2022: ACHIEVED 02/19/2022 ACHIEVED  5 Pt will be able to perform stairs with a reciprocal gait without the rail with good control.  Baseline: 02/03/2022: ACHIEVED 03/05/2022 ACHIEVED  6 Pt will be able to perform all of his school activities and work activities without significant pain or difficulty.   Baseline: 03/08/2022: Pt reports difficulty with crouching at work and climbing ladders 02/19/2022 PROGRESSING  7 Pt will demo 5/5 L hip and knee strength for improved tolerance with and performance  of functional mobility skills and to assist in returning to PLOF.   Baseline: 03/08/2022: 5/5 globally 03/05/2022 ACHIEVED    PLAN: PT FREQUENCY: 2-3x/week   PT DURATION: other: 6 weeks   PLANNED INTERVENTIONS: Therapeutic exercises, Therapeutic activity, Neuro Muscular re-education, Balance training, Gait training, Patient/Family education, Joint mobilization, Stair training, DME instructions, Aquatic Therapy, Electrical stimulation, Cryotherapy, Moist heat, Taping, and Manual therapy   PLAN FOR NEXT SESSION:  aggressive knee AROM, closed-chain strengthening and intro to plyometrics as able. Response to taping.  Allergy to certain adhesives including tegaderm.    Vanessa Beaver, PT, DPT 04/14/22 10:27 AM

## 2022-04-16 ENCOUNTER — Other Ambulatory Visit: Payer: Self-pay

## 2022-04-16 ENCOUNTER — Ambulatory Visit: Payer: No Typology Code available for payment source | Admitting: Physical Therapy

## 2022-04-16 ENCOUNTER — Encounter: Payer: Self-pay | Admitting: Physical Therapy

## 2022-04-16 DIAGNOSIS — M25562 Pain in left knee: Secondary | ICD-10-CM

## 2022-04-16 DIAGNOSIS — M25662 Stiffness of left knee, not elsewhere classified: Secondary | ICD-10-CM

## 2022-04-16 DIAGNOSIS — R262 Difficulty in walking, not elsewhere classified: Secondary | ICD-10-CM

## 2022-04-16 DIAGNOSIS — M6281 Muscle weakness (generalized): Secondary | ICD-10-CM

## 2022-04-16 NOTE — Therapy (Signed)
OUTPATIENT PHYSICAL THERAPY TREATMENT NOTE  Patient Name: Dustin Parks MRN: [id number] DOB:Nov 02, 2001, 21 y.o., male Today's Date: 04/16/2022  PCP: Haydee Salter, MD REFERRING PROVIDER: Dr Donivan Scull    PT End of Session - 04/16/22 1013     Visit Number 39    Number of Visits 50    Date for PT Re-Evaluation 04/26/22    Authorization Type MCE    Authorization Time Period 03/19/2022 - 06/19/2022    Authorization - Visit Number 5    Authorization - Number of Visits 16    PT Start Time 1006    PT Stop Time 1045    PT Time Calculation (min) 39 min    Activity Tolerance Patient tolerated treatment well    Behavior During Therapy [facility] for tasks assessed/performed                      Past Medical History:  Diagnosis Date   Allergy    Asthma    out grown now    Past Surgical History:  Procedure Laterality Date   KNEE ARTHROSCOPY Left 02/02/2022   Procedure: LEFT ARTHROSCOPY KNEE LYSIS OF ADHESION AND MANIPULATION UNDER ANESTHESIA;  Surgeon: [name], MD;  Location: [place];  Service: Orthopedics;  Laterality: Left;   KNEE ARTHROSCOPY WITH ANTERIOR CRUCIATE LIGAMENT (ACL) REPAIR WITH HAMSTRING GRAFT Left 11/08/2021   Procedure: LEFT KNEE ANTERIOR CRUCIATE LIGAMENT RECONSTRUCTION WITH QUADRICEPS TENDON AUTOGRAFT;  Surgeon: [name], MD;  Location: [place];  Service: Orthopedics;  Laterality: Left;   KNEE ARTHROSCOPY WITH MENISCAL REPAIR  11/08/2021   Procedure: LEFT LATERAL MENISCAL REPAIR;  Surgeon: [name], MD;  Location: [place];  Service: Orthopedics;;   [place] EXTRACTION  2022   Patient Active Problem List   Diagnosis Date Noted   Peripheral tear of lateral meniscus of left knee as current injury    Arthrofibrosis of knee joint, left 01/31/2022   S/P repair of anterior cruciate ligament 01/31/2022   Morbid obesity with BMI of 40.0-44.9, adult ([place]) 01/31/2022    Hypertriglyceridemia 01/31/2022   Angioedema of lips- [place] 12/13/2021   Complex tear of lateral meniscus of left knee as current injury    History of orchitis 02/08/2021     PCP: Libby Maw, MD   REFERRING PROVIDER: [name], MD   REFERRING DIAG:  [phone] (ICD-10-CM) - Rupture of anterior cruciate ligament of left knee, initial encounter  S83.272A (ICD-10-CM) - Complex tear of lateral meniscus of left knee as current injury, initial encounter Z98.890 (ICD-10-CM) - S/P left knee arthroscopy    THERAPY DIAG:  Left knee pain, unspecified chronicity   Stiffness of left knee, not elsewhere classified   Muscle weakness (generalized)   Difficulty in walking, not elsewhere classified   ONSET DATE: Injury 10/09/2021 / Surgery 11/08/2021    SUBJECTIVE:  SUBJECTIVE STATEMENT: Patient reports continued pain on the outside of the knee.   PERTINENT HISTORY: 11/08/2021 Left knee ACL reconstruction with quadriceps autograft and lateral meniscal repair.    02/02/2022: Lt knee lysis of adheresion and manipulation under anesthesia  Allergy to certain adhesives including tegaderm per pt report    PAIN:  Are you having pain? No NPRS scale: 0/10 at rest, 1/10 when pain occurs Pain location: right lateral knee  Pain orientation: Right  PAIN TYPE: aching Pain description: intermittent  Aggravating factors: use of the knee / falling off the bed  Relieving factors: rest  PRECAUTIONS: Other: per Dr. Eddie Dibbles ACL reconstruction with meniscal repair   WEIGHT BEARING RESTRICTIONS Yes      OCCUPATION: in culinary school     PLOF: Independent; Pt was able to perform all of his ADLs/IADLs and functional mobility skills without difficulty or limitations.  Pt was able to perform work activities.  Pt ambulated without an AD with good stability without difficulty.    PATIENT GOALS improved stability with leaning, to have the ability to run, to be able to bowl.  Return to PLOF.       OBJECTIVE:  *Unless otherwise noted, objective measures collected previously*  LE ROM:  A/PROM Right  Left 02/03/2022 Left 02/16/2022 Left 03/08/2022 Left 04/14/2022  Knee flexion  93 AROM/ 97 AAROM/ 103PROM/ 106 PROM following MET 96 AROM, 102 PROM, 111 following MET 106 AROM, 111 PROM, 118 following MET 117 AROM  Knee extension  -1 AROM/ 0 PROM 2 AROM, 4 PROM 2 AROM, 4 PROM 3 AROM   (Blank rows = not tested)  LE MMT:  MMT Right  Left  Left 03/08/2022  Hip flexion  4+/5 5/5  Hip extension     Hip abduction     Knee flexion  5/5 5/5  Knee extension  4+/5 5/5  Ankle dorsiflexion  5/5   Ankle plantarflexion  5/5   Ankle inversion     Ankle eversion      (Blank rows = not tested)   FUNCTIONAL TESTS:  -Dead lift: Good form and depth with no pain    TODAY'S TREATMENT: [facility] Adult PT Treatment:                                                DATE: 04/16/2022 Therapeutic Exercise: Recumbent bike L3 x 5 min while taking subjective Modified thomas stretch with manual overpressure 3 x 30 sec SLR with 3# 3 x 10 Sidelying hip abduction with 3# 3 x 10 Trialed forward heel tap on 4" but patient unable to control Forward heel tap 2" with TRX support x 10 - patient reports lateral knee discomfort with unlocking from knee extension to flexion Standing TKE with blue 2 x 20 SL leg press (cybex) 40# 3 x 10 Manual: STM using roller to VL, ITB, and lateral gluteal regions Sidelying passive gluteal/ITB stretch   [facility] Adult PT Treatment:                                                DATE: 04/14/2022 Therapeutic Exercise: [place] split squats with GTB pulling Lt knee into adduction, 15# kettlebell kettlebell in Lt hand, Rt UE support, 2x10 on Lt Squat hops 3x8 Side shuffle hops 3x20 Seated BIL hamstring curls with 45# cable 3x10 Seated BIL knee extension with 25# cable 3x10 Standing Lt quad stretch x2 min  [facility] Adult PT Treatment:                                                 DATE: 04/03/2022 Therapeutic Exercise: Exercise bike x5 minutes with progressive lowering while collecting subjective information [place] split squats with GTB pulling Lt  knee into adduction 2x10 on Lt 8-inch box drop into squat, followed by overhead reach and heel raise 2x10 SLR with abduction 2x10 on Lt Manual Therapy: PROM into flexion x5 with 30sec hold at end range Knee flexion contract/ counter-contract MET x5 with 30-sec hold at end range  [facility] Adult PT Treatment:                                                DATE: 03/28/2022 120 flexion Therapeutic Exercise: Elliptical L 5 x 5 min ramp L1 Slant board stretch 2 x 30 sec Standing quad stretch 2 x 30 [place] Standing hamstring stretch 2 x 30 second Lunge 2 x 10 touching back knee on to airex pad.  LAQ , LLE 1 x 10 progressing to con bil/ ecc LLE 1 x 10 15# Hamstring curl LLE only 2 x 15 with 25# Modalities: Ice pack x 10 min  Therapeutic Activity: Gait training ,brisk walking with max cues to avoid limping with increased step on involved and reduced step on uninvolved. 8 x 40 ft  Education details: HEP Person educated: Patient Education method: Explanation, Demonstration, Tactile cues, Verbal cues Education comprehension: verbalized understanding, returned demonstration, verbal cues required, tactile cues required, and needs further education   HOME EXERCISE PROGRAM: Access Code: [email or web address]    ASSESSMENT: CLINICAL IMPRESSION: Patient tolerated therapy well with no adverse effects. He reports persistent lateral knee pain, he did have tenderness over VL and ITB region especially distally over femoral condyle, and with report of pain when transitioning from knee extension to flexion possibly indicating ITB pain. He did not report improvement in symptoms following manual therapy. Patient does continue to exhibit gross strength deficit of the left quad and hips with poor eccentric control while performing forward heel tap. No changes  made to HEP this visit. Patient would benefit from continued skilled PT to progress his strength and control in order to reduce pain and maximize functional ability.  Objective impairments include Abnormal gait, decreased activity tolerance, decreased balance, decreased mobility, difficulty walking, decreased ROM, decreased strength, hypomobility, increased edema, impaired flexibility, and pain. These impairments are limiting patient from cleaning, community activity, driving, meal prep, occupation, laundry, shopping, school, and ambulation .    GOALS: SHORT TERM GOALS:   STG Name Target Date Goal status  1 Pt will be independent and compliant with HEP for improved pain, strength, and ROM.  Baseline:  12/11/2021 PARTIALLY MET  2 Pt will demo a good quad set and perform supine SLRs independently with brace for improved quad strength. Baseline:  11/27/2021 MET 02/08/2022  3 Pt will demo improved improved L knee extension PROM/AROM to 0/3 and flexion PROM to 60 deg for improved mobility and stiffness. Baseline: 12/04/2021 GOAL MET  4 Pt will demo improved improved L knee extension AROM to 0 deg and  flexion AAROM/PROM to 80/90 deg for improved mobility and stiffness. Baseline: 12/18/2021 GOAL MET  5 Pt will demo L knee AROM to be 0 - 120 deg for improved stiffness and performance of functional mobility.   Baseline: 03/08/2022: 106 AROM 01/22/2022 PROGRESSING   6 Pt will progress Wb'ing with gait per MD orders and protocol without adverse effects.  Baseline: 12/18/2021 GOAL MET  7 Pt will progress with closed chain exercises per protocol without adverse effects for improved functional strength and stability.  Baseline: 12/25/2021 GOAL MET  8 Pt will score 0-1 on the lateral step down test on a 4 inch step for improved quad eccentric control.   01/22/2022   MET  9 Pt will score 0-1 on the lateral step down test on a 6 inch step for improved quad eccentric control and performance of stairs.   02/06/2022 MET  02/08/2022  10 Pt will ambulate with a normalized heel to toe gait without limping without AD 01/22/2022 MET 02/08/2022    LONG TERM GOALS:    LTG Name Target Date Goal status  1 Pt will be able to perform his ADLs/IADLs and normal functional mobility skills without significant difficulty or pain.  Baseline: Pt reports some mild-to-moderate difficulty with getting into bed and crouching at work 03/05/2022   PROGRESSING  2 Pt will ambulate extended community distance without an AD without increased pain or difficulty.  Baseline: 03/08/2022: Pt reports ability to walk community distances without AD, but with moderate fatigue 02/19/2022 PROGRESSING  3 Pt will be able to perform his normal daily transfers without difficulty.  Baseline: 02/05/2022 ACHIEVED  4 Pt will demo good squatting form, symmetrical Wb'ing, and good knee alignment without significant pain for improved functional strength and tolerance with IADLs.  Baseline: 02/03/2022: ACHIEVED 02/19/2022 ACHIEVED  5 Pt will be able to perform stairs with a reciprocal gait without the rail with good control.  Baseline: 02/03/2022: ACHIEVED 03/05/2022 ACHIEVED  6 Pt will be able to perform all of his school activities and work activities without significant pain or difficulty.   Baseline: 03/08/2022: Pt reports difficulty with crouching at work and climbing ladders 02/19/2022 PROGRESSING  7 Pt will demo 5/5 L hip and knee strength for improved tolerance with and performance of functional mobility skills and to assist in returning to PLOF.   Baseline: 03/08/2022: 5/5 globally 03/05/2022 ACHIEVED     PLAN: PT FREQUENCY: 2-3x/week   PT DURATION: other: 6 weeks   PLANNED INTERVENTIONS: Therapeutic exercises, Therapeutic activity, Neuro Muscular re-education, Balance training, Gait training, Patient/Family education, Joint mobilization, Stair training, DME instructions, Aquatic Therapy, Electrical stimulation, Cryotherapy, Moist heat, Taping, and Manual therapy    PLAN FOR NEXT SESSION:  aggressive knee AROM, closed-chain strengthening and intro to plyometrics as able. Response to taping.  Allergy to certain adhesives including tegaderm.    Hilda Blades, PT, DPT, LAT, ATC 04/16/22  12:25 PM Phone: [phone] Fax: [phone]

## 2022-04-18 NOTE — Therapy (Incomplete)
OUTPATIENT PHYSICAL THERAPY TREATMENT NOTE  Patient Name: Dustin Parks MRN: [id number] DOB:2000-11-18, 21 y.o., male Today's Date: 04/18/2022  PCP: Haydee Salter, MD REFERRING PROVIDER: Dr Donivan Scull               Past Medical History:  Diagnosis Date   Allergy    Asthma    out grown now    Past Surgical History:  Procedure Laterality Date   KNEE ARTHROSCOPY Left 02/02/2022   Procedure: LEFT ARTHROSCOPY KNEE LYSIS OF ADHESION AND MANIPULATION UNDER ANESTHESIA;  Surgeon: [name], MD;  Location: [place] [facility];  Service: Orthopedics;  Laterality: Left;   KNEE ARTHROSCOPY WITH ANTERIOR CRUCIATE LIGAMENT (ACL) REPAIR WITH HAMSTRING GRAFT Left 11/08/2021   Procedure: LEFT KNEE ANTERIOR CRUCIATE LIGAMENT RECONSTRUCTION WITH QUADRICEPS TENDON AUTOGRAFT;  Surgeon: [name], MD;  Location: [place];  Service: Orthopedics;  Laterality: Left;   KNEE ARTHROSCOPY WITH MENISCAL REPAIR  11/08/2021   Procedure: LEFT LATERAL MENISCAL REPAIR;  Surgeon: [name], MD;  Location: [facility];  Service: Orthopedics;;   [place] EXTRACTION  2022   Patient Active Problem List   Diagnosis Date Noted   Peripheral tear of lateral meniscus of left knee as current injury    Arthrofibrosis of knee joint, left 01/31/2022   S/P repair of anterior cruciate ligament 01/31/2022   Morbid obesity with BMI of 40.0-44.9, adult ([place]) 01/31/2022   Hypertriglyceridemia 01/31/2022   Angioedema of lips- [place] 12/13/2021   Complex tear of lateral meniscus of left knee as current injury    History of orchitis 02/08/2021     PCP: Libby Maw, MD   REFERRING PROVIDER: [name], MD   REFERRING DIAG:  [phone] (ICD-10-CM) - Rupture of anterior cruciate ligament of left knee, initial encounter  S83.272A (ICD-10-CM) - Complex tear of lateral meniscus of left knee as current injury, initial encounter Z98.890 (ICD-10-CM)  - S/P left knee arthroscopy    THERAPY DIAG:  Left knee pain, unspecified chronicity   Stiffness of left knee, not elsewhere classified   Muscle weakness (generalized)   Difficulty in walking, not elsewhere classified   ONSET DATE: Injury 10/09/2021 / Surgery 11/08/2021    SUBJECTIVE:  SUBJECTIVE STATEMENT: Patient reports continued pain on the outside of the knee.   PERTINENT HISTORY: 11/08/2021 Left knee ACL reconstruction with quadriceps autograft and lateral meniscal repair.    02/02/2022: Lt knee lysis of adheresion and manipulation under anesthesia  Allergy to certain adhesives including tegaderm per pt report    PAIN:  Are you having pain? No NPRS scale: 0/10 at rest, 1/10 when pain occurs Pain location: right lateral knee  Pain orientation: Right  PAIN TYPE: aching Pain description: intermittent  Aggravating factors: use of the knee / falling off the bed  Relieving factors: rest     PRECAUTIONS: Other: per Dr. Eddie Dibbles ACL reconstruction with meniscal repair   WEIGHT BEARING RESTRICTIONS Yes      OCCUPATION: in culinary school     PLOF: Independent; Pt was able to perform all of his ADLs/IADLs and functional mobility skills without difficulty or limitations.  Pt was able to perform work activities.  Pt ambulated without an AD with good stability without difficulty.    PATIENT GOALS improved stability with leaning, to have the ability to run, to be able to bowl.  Return to PLOF.      OBJECTIVE:  *Unless otherwise noted, objective measures collected previously*  LE ROM:  A/PROM Right  Left 02/03/2022 Left 02/16/2022 Left 03/08/2022 Left 04/14/2022  Knee flexion  93 AROM/ 97 AAROM/ 103PROM/ 106 PROM following MET 96 AROM, 102 PROM, 111 following MET 106 AROM, 111 PROM, 118 following MET 117 AROM  Knee extension  -1 AROM/ 0 PROM 2 AROM, 4 PROM 2 AROM, 4 PROM 3 AROM   (Blank rows = not tested)  LE MMT:  MMT Right  Left  Left 03/08/2022  Hip flexion  4+/5 5/5   Hip extension     Hip abduction     Knee flexion  5/5 5/5  Knee extension  4+/5 5/5  Ankle dorsiflexion  5/5   Ankle plantarflexion  5/5   Ankle inversion     Ankle eversion      (Blank rows = not tested)   FUNCTIONAL TESTS:  -Dead lift: Good form and depth with no pain    TODAY'S TREATMENT: [facility] Adult PT Treatment:                                                DATE: 04/19/2022 Therapeutic Exercise: Recumbent bike L3 x 5 min while taking subjective Modified thomas stretch with manual overpressure 3 x 30 sec SLR with 3# 3 x 10 Sidelying hip abduction with 3# 3 x 10 Trialed forward heel tap on 4" but patient unable to control Forward heel tap 2" with TRX support x 10 - patient reports lateral knee discomfort with unlocking from knee extension to flexion Standing TKE with blue 2 x 20 SL leg press (cybex) 40# 3 x 10 Manual: STM using roller to VL, ITB, and lateral gluteal regions Sidelying passive gluteal/ITB stretch   [facility] Adult PT Treatment:                                                DATE: 04/16/2022 Therapeutic Exercise: Recumbent bike L3 x 5 min while taking subjective Modified thomas stretch with manual overpressure 3 x 30 sec SLR with 3# 3 x 10 Sidelying hip abduction with 3# 3 x 10 Trialed forward heel tap on 4" but patient unable to control Forward heel tap 2" with TRX support x 10 - patient reports lateral knee discomfort with unlocking from knee extension to flexion Standing TKE with blue 2 x 20 SL leg press (cybex) 40# 3 x 10 Manual: STM using roller to VL, ITB, and lateral gluteal regions Sidelying passive gluteal/ITB stretch   [facility] Adult PT Treatment:                                                DATE: 04/14/2022 Therapeutic Exercise: [place] split squats with GTB pulling Lt knee into adduction, 15# kettlebell kettlebell in Lt hand, Rt UE support, 2x10 on Lt Squat hops 3x8 Side shuffle hops 3x20 Seated BIL hamstring curls with 45# cable 3x10 Seated  BIL knee extension with 25# cable 3x10 Standing Lt quad stretch x2 min  [facility] Adult PT Treatment:  DATE: 04/03/2022 Therapeutic Exercise: Exercise bike x5 minutes with progressive lowering while collecting subjective information [place] split squats with GTB pulling Lt knee into adduction 2x10 on Lt 8-inch box drop into squat, followed by overhead reach and heel raise 2x10 SLR with abduction 2x10 on Lt Manual Therapy: PROM into flexion x5 with 30sec hold at end range Knee flexion contract/ counter-contract MET x5 with 30-sec hold at end range  [facility] Adult PT Treatment:                                                DATE: 03/28/2022 120 flexion Therapeutic Exercise: Elliptical L 5 x 5 min ramp L1 Slant board stretch 2 x 30 sec Standing quad stretch 2 x 30 [place] Standing hamstring stretch 2 x 30 second Lunge 2 x 10 touching back knee on to airex pad.  LAQ , LLE 1 x 10 progressing to con bil/ ecc LLE 1 x 10 15# Hamstring curl LLE only 2 x 15 with 25# Modalities: Ice pack x 10 min  Therapeutic Activity: Gait training ,brisk walking with max cues to avoid limping with increased step on involved and reduced step on uninvolved. 8 x 40 ft  Education details: HEP Person educated: Patient Education method: Explanation, Demonstration, Tactile cues, Verbal cues Education comprehension: verbalized understanding, returned demonstration, verbal cues required, tactile cues required, and needs further education   HOME EXERCISE PROGRAM: Access Code: [email or web address]    ASSESSMENT: CLINICAL IMPRESSION: Patient tolerated therapy well with no adverse effects. *** Patient would benefit from continued skilled PT to progress his strength and control in order to reduce pain and maximize functional ability.  He reports persistent lateral knee pain, he did have tenderness over VL and ITB region especially distally over femoral condyle, and with report of pain when  transitioning from knee extension to flexion possibly indicating ITB pain. He did not report improvement in symptoms following manual therapy. Patient does continue to exhibit gross strength deficit of the left quad and hips with poor eccentric control while performing forward heel tap. No changes made to HEP this visit.   Objective impairments include Abnormal gait, decreased activity tolerance, decreased balance, decreased mobility, difficulty walking, decreased ROM, decreased strength, hypomobility, increased edema, impaired flexibility, and pain. These impairments are limiting patient from cleaning, community activity, driving, meal prep, occupation, laundry, shopping, school, and ambulation .    GOALS: SHORT TERM GOALS:   STG Name Target Date Goal status  1 Pt will be independent and compliant with HEP for improved pain, strength, and ROM.  Baseline:  12/11/2021 PARTIALLY MET  2 Pt will demo a good quad set and perform supine SLRs independently with brace for improved quad strength. Baseline:  11/27/2021 MET 02/08/2022  3 Pt will demo improved improved L knee extension PROM/AROM to 0/3 and flexion PROM to 60 deg for improved mobility and stiffness. Baseline: 12/04/2021 GOAL MET  4 Pt will demo improved improved L knee extension AROM to 0 deg and  flexion AAROM/PROM to 80/90 deg for improved mobility and stiffness. Baseline: 12/18/2021 GOAL MET  5 Pt will demo L knee AROM to be 0 - 120 deg for improved stiffness and performance of functional mobility.   Baseline: 03/08/2022: 106 AROM 01/22/2022 PROGRESSING   6 Pt will progress Wb'ing with gait per MD orders and protocol without adverse effects.  Baseline: 12/18/2021 GOAL MET  7 Pt will progress with closed chain exercises per protocol without adverse effects for improved functional strength and stability.  Baseline: 12/25/2021 GOAL MET  8 Pt will score 0-1 on the lateral step down test on a 4 inch step for improved quad eccentric control.    01/22/2022   MET  9 Pt will score 0-1 on the lateral step down test on a 6 inch step for improved quad eccentric control and performance of stairs.   02/06/2022 MET 02/08/2022  10 Pt will ambulate with a normalized heel to toe gait without limping without AD 01/22/2022 MET 02/08/2022    LONG TERM GOALS:    LTG Name Target Date Goal status  1 Pt will be able to perform his ADLs/IADLs and normal functional mobility skills without significant difficulty or pain.  Baseline: Pt reports some mild-to-moderate difficulty with getting into bed and crouching at work 03/05/2022   PROGRESSING  2 Pt will ambulate extended community distance without an AD without increased pain or difficulty.  Baseline: 03/08/2022: Pt reports ability to walk community distances without AD, but with moderate fatigue 02/19/2022 PROGRESSING  3 Pt will be able to perform his normal daily transfers without difficulty.  Baseline: 02/05/2022 ACHIEVED  4 Pt will demo good squatting form, symmetrical Wb'ing, and good knee alignment without significant pain for improved functional strength and tolerance with IADLs.  Baseline: 02/03/2022: ACHIEVED 02/19/2022 ACHIEVED  5 Pt will be able to perform stairs with a reciprocal gait without the rail with good control.  Baseline: 02/03/2022: ACHIEVED 03/05/2022 ACHIEVED  6 Pt will be able to perform all of his school activities and work activities without significant pain or difficulty.   Baseline: 03/08/2022: Pt reports difficulty with crouching at work and climbing ladders 02/19/2022 PROGRESSING  7 Pt will demo 5/5 L hip and knee strength for improved tolerance with and performance of functional mobility skills and to assist in returning to PLOF.   Baseline: 03/08/2022: 5/5 globally 03/05/2022 ACHIEVED     PLAN: PT FREQUENCY: 2-3x/week   PT DURATION: other: 6 weeks   PLANNED INTERVENTIONS: Therapeutic exercises, Therapeutic activity, Neuro Muscular re-education, Balance training, Gait training,  Patient/Family education, Joint mobilization, Stair training, DME instructions, Aquatic Therapy, Electrical stimulation, Cryotherapy, Moist heat, Taping, and Manual therapy   PLAN FOR NEXT SESSION:  aggressive knee AROM, closed-chain strengthening and intro to plyometrics as able. Response to taping.  Allergy to certain adhesives including tegaderm.    Hilda Blades, PT, DPT, LAT, ATC 04/18/22  9:26 AM Phone: [phone] Fax: [phone]

## 2022-04-19 ENCOUNTER — Ambulatory Visit: Payer: No Typology Code available for payment source | Admitting: Physical Therapy

## 2022-04-19 ENCOUNTER — Other Ambulatory Visit (HOSPITAL_BASED_OUTPATIENT_CLINIC_OR_DEPARTMENT_OTHER): Payer: Self-pay | Admitting: Orthopaedic Surgery

## 2022-04-19 ENCOUNTER — Telehealth: Payer: Self-pay | Admitting: Orthopaedic Surgery

## 2022-04-19 DIAGNOSIS — M24662 Ankylosis, left knee: Secondary | ICD-10-CM

## 2022-04-19 NOTE — Telephone Encounter (Signed)
Pt called requesting a referral for MRI. Pt phone number is [phone].

## 2022-04-20 ENCOUNTER — Encounter (HOSPITAL_BASED_OUTPATIENT_CLINIC_OR_DEPARTMENT_OTHER): Payer: No Typology Code available for payment source | Admitting: Orthopaedic Surgery

## 2022-04-22 ENCOUNTER — Ambulatory Visit
Admission: RE | Admit: 2022-04-22 | Discharge: 2022-04-22 | Disposition: A | Discharge status: Home / Self Care | Payer: No Typology Code available for payment source | Source: Ambulatory Visit | Attending: Orthopaedic Surgery | Admitting: Orthopaedic Surgery

## 2022-04-22 DIAGNOSIS — M24662 Ankylosis, left knee: Secondary | ICD-10-CM

## 2022-04-22 IMAGING — MR MR KNEE*L* W/O CM
4 of 8 series · 19 of 40 positions shown · non-contrast
Comparison: [DATE]

CLINICAL DATA: Left knee pain. Lateral pain. Twisting injury
[DATE]

EXAM:
MRI OF THE LEFT KNEE WITHOUT CONTRAST
TECHNIQUE: Multiplanar, multisequence MR imaging of the knee was performed. No
intravenous contrast was administered.

[Series 7: PD fat-sat · sagittal · 3.0mm · 0.29mm/px · 5 of 27 slices shown (1 of 4)]
[im 1/27]
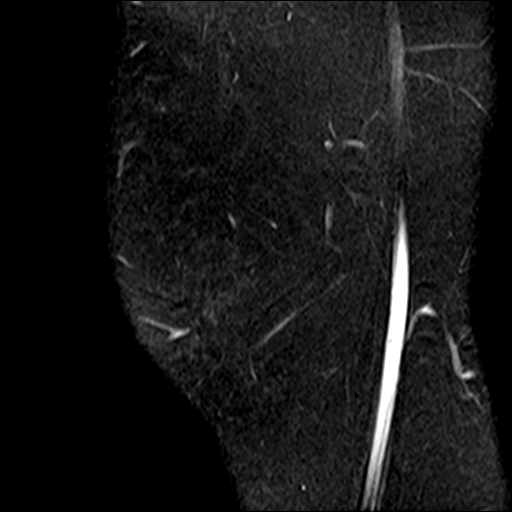
[im 7/27]
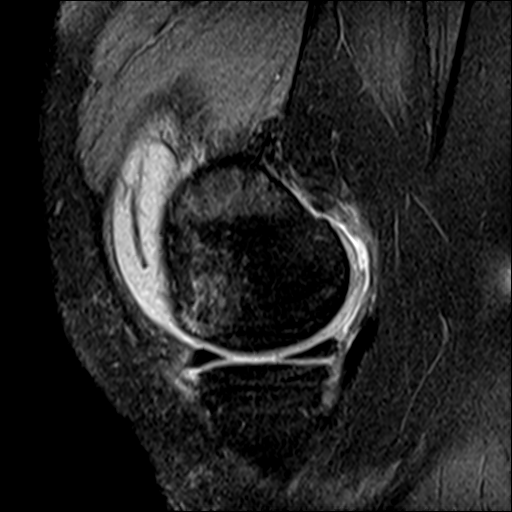
[im 14/27]
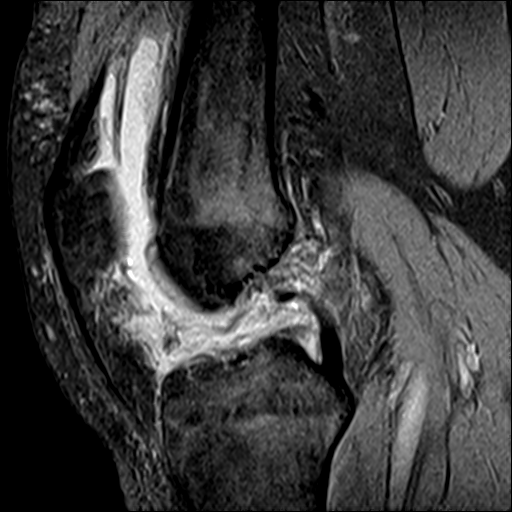
[im 20/27]
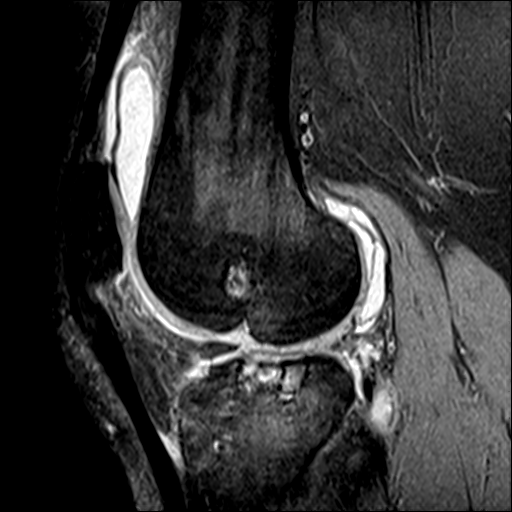
[im 27/27]
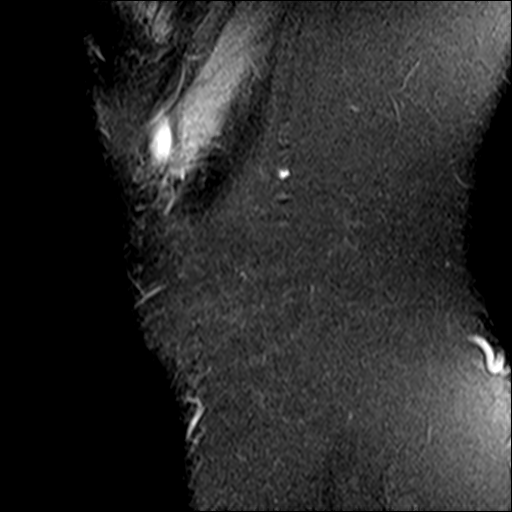

[Series 8: PD fat-sat · coronal · 3.0mm · 0.29mm/px · 6 of 30 slices shown (2 of 4)]
[im 1/30]
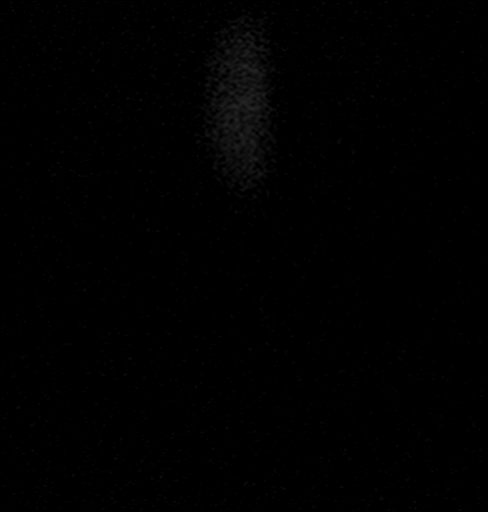
[im 6/30]
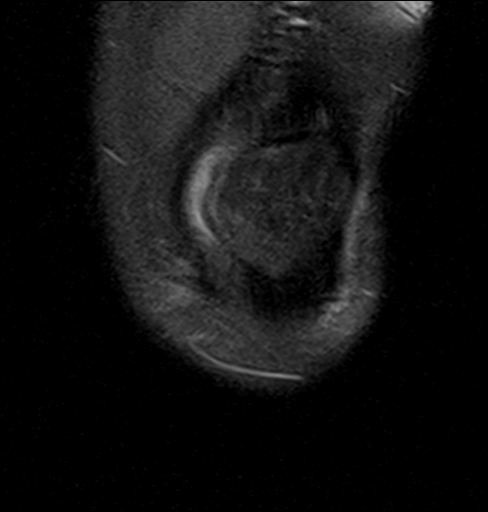
[im 12/30]
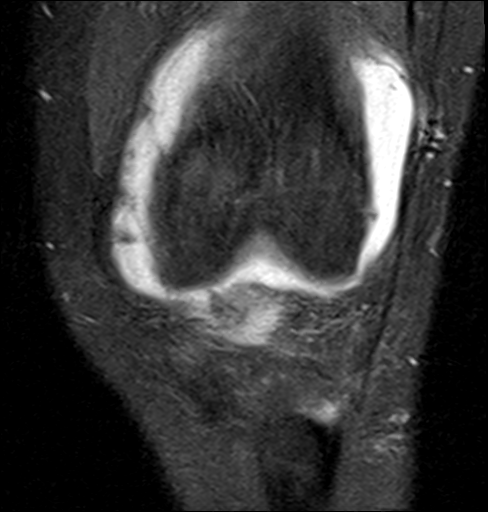
[im 18/30]
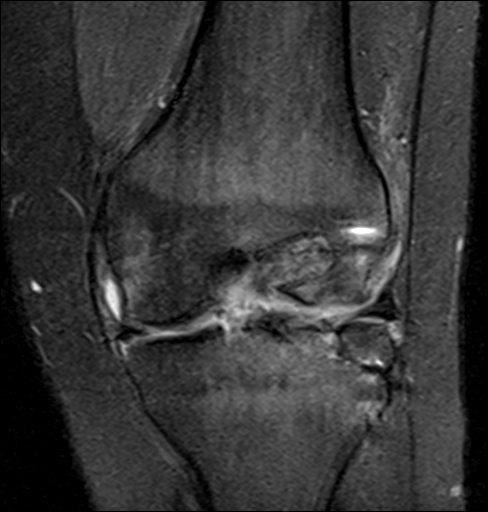
[im 24/30]
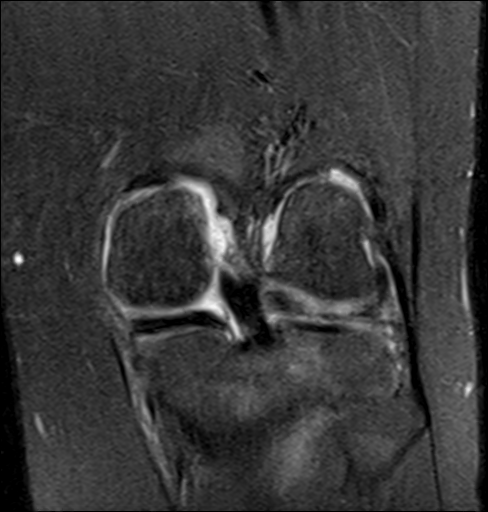
[im 30/30]
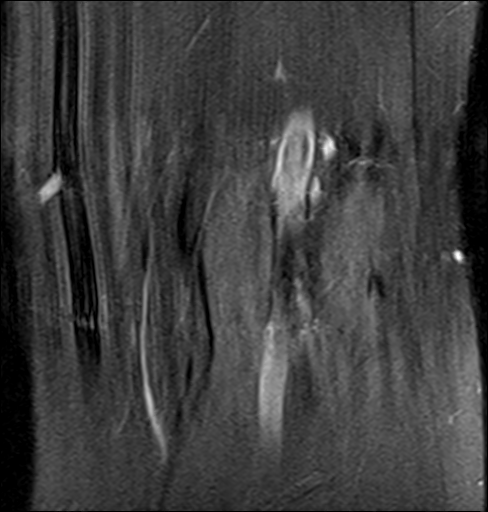

[Series 9: PD fat-sat · oblique · 2.0mm · 0.29mm/px · 2 of 11 slices shown (3 of 4)]
[im 1/11]
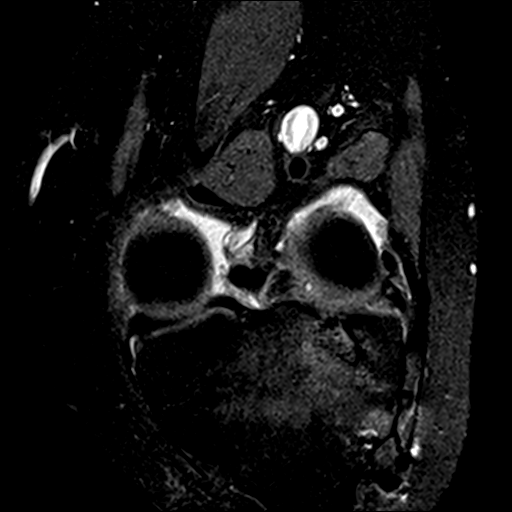
[im 11/11]
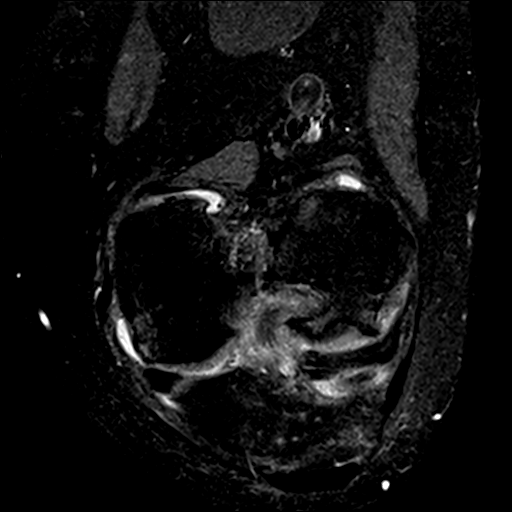

[Series 10: PD fat-sat · coronal · 3.0mm · 0.29mm/px · 6 of 30 slices shown (4 of 4)]
[im 1/30]
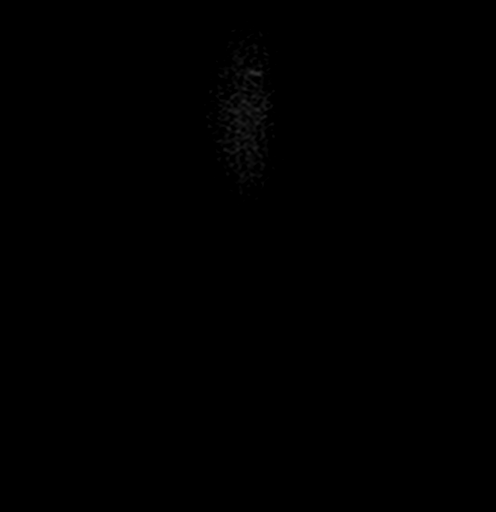
[im 6/30]
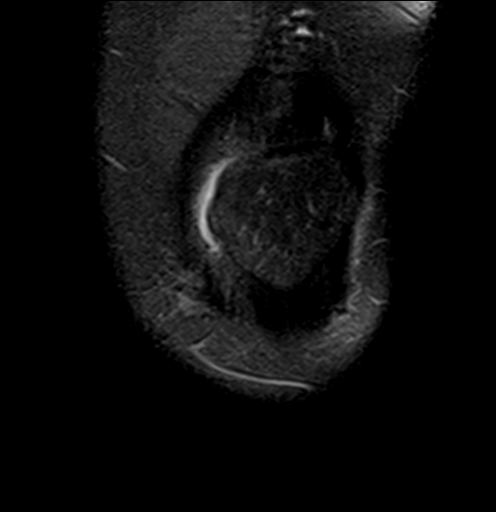
[im 12/30]
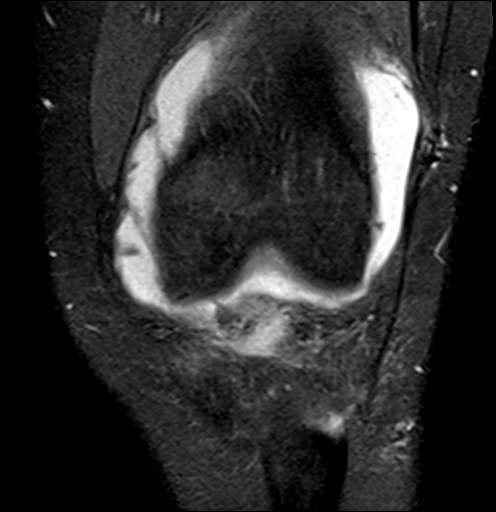
[im 18/30]
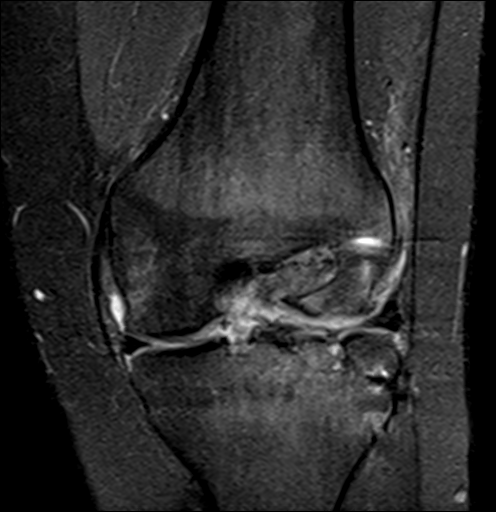
[im 24/30]
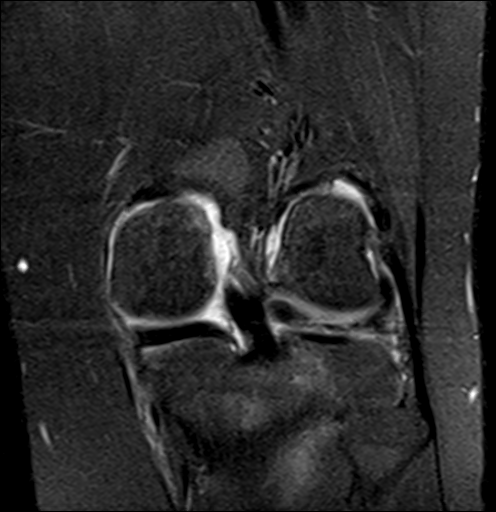
[im 30/30]
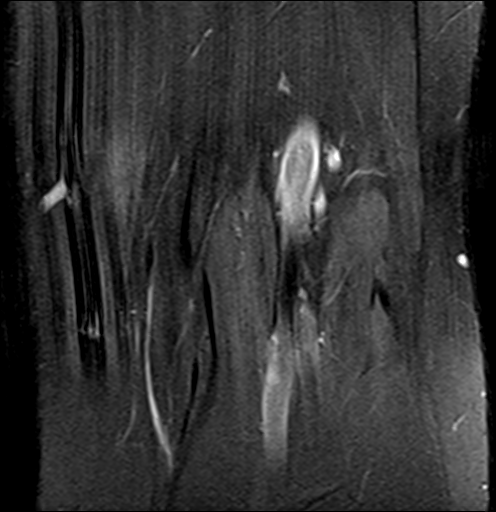

[19 of 40 positions shown; findings below may reference images not displayed]

FINDINGS: MENISCI

Medial: Intact.

Lateral: Severe attenuation of the posterior horn of the lateral
meniscus consistent with prior meniscectomy. Complex signal in the
body of the lateral meniscus concerning for a complex tear.

LIGAMENTS

Cruciates: Prior ACL repair. Complete tear of the ACL graft.
Intermediate signal anterior to the distal ACL insertion which may
reflect a torn ACL fibers versus fibrosis. Intact PCL.

Collaterals: Medial collateral ligament is intact. Lateral
collateral ligament complex is intact.

CARTILAGE

Patellofemoral:  No chondral defect.

Medial:  No chondral defect.

Lateral: Focal partial-thickness cartilage loss of the lateral
tibial plateau with subchondral reactive marrow edema.
Partial-thickness cartilage loss of the lateral femoral condyle.

JOINT: Large joint effusion. Normal IEVA-DAINIUS. Thickened
medial patellar plica.

POPLITEAL FOSSA: Popliteus tendon is intact. No Baker's cyst.

EXTENSOR MECHANISM: Intact quadriceps tendon. Intact patellar
tendon. Intact lateral patellar retinaculum. Intact medial patellar
retinaculum. Intact MPFL.

BONES: No aggressive osseous lesion. No fracture or dislocation.
Mild marrow edema in the periphery of the anteromedial femoral
condyle, lateral tibial plateau and anterolateral femoral condyle
most consistent with osseous contusions.

Other: No fluid collection or hematoma. Muscles are normal.
IMPRESSION: 1. Prior ACL repair. Complete tear of the ACL graft. Soft tissue
anterior to the distal ACL insertion which may reflect a torn ACL
fibers versus fibrosis.
2. Severe attenuation of the posterior horn of the lateral meniscus
consistent with prior meniscectomy. Complex signal in the body of
the lateral meniscus concerning for a complex tear.
3. Focal partial-thickness cartilage loss of the lateral tibial
plateau with subchondral reactive marrow edema. Partial-thickness
cartilage loss of the lateral femoral condyle.
4. Large joint effusion.

## 2022-04-23 ENCOUNTER — Ambulatory Visit (INDEPENDENT_AMBULATORY_CARE_PROVIDER_SITE_OTHER): Payer: No Typology Code available for payment source

## 2022-04-23 ENCOUNTER — Other Ambulatory Visit (HOSPITAL_BASED_OUTPATIENT_CLINIC_OR_DEPARTMENT_OTHER): Payer: Self-pay

## 2022-04-23 ENCOUNTER — Ambulatory Visit (INDEPENDENT_AMBULATORY_CARE_PROVIDER_SITE_OTHER): Payer: No Typology Code available for payment source | Admitting: Orthopaedic Surgery

## 2022-04-23 ENCOUNTER — Ambulatory Visit (HOSPITAL_BASED_OUTPATIENT_CLINIC_OR_DEPARTMENT_OTHER): Payer: Self-pay | Admitting: Orthopaedic Surgery

## 2022-04-23 DIAGNOSIS — T8489XA Other specified complication of internal orthopedic prosthetic devices, implants and grafts, initial encounter: Secondary | ICD-10-CM

## 2022-04-23 DIAGNOSIS — M25562 Pain in left knee: Secondary | ICD-10-CM

## 2022-04-23 DIAGNOSIS — M24662 Ankylosis, left knee: Secondary | ICD-10-CM | POA: Diagnosis not present

## 2022-04-23 MED ORDER — OXYCODONE HCL 5 MG PO TABS
5.0000 mg | ORAL_TABLET | ORAL | 0 refills | Status: DC | PRN
  Filled 2022-04-23: qty 20, 4d supply, fill #0

## 2022-04-23 MED ORDER — IBUPROFEN 800 MG PO TABS
800.0000 mg | ORAL_TABLET | Freq: Three times a day (TID) | ORAL | 0 refills | Status: AC
  Filled 2022-04-23: qty 30, 10d supply, fill #0

## 2022-04-23 MED ORDER — ASPIRIN 325 MG PO TBEC
325.0000 mg | DELAYED_RELEASE_TABLET | Freq: Every day | ORAL | 0 refills | Status: AC
  Filled 2022-04-23: qty 30, 30d supply, fill #0

## 2022-04-23 MED ORDER — ACETAMINOPHEN 500 MG PO TABS
500.0000 mg | ORAL_TABLET | Freq: Three times a day (TID) | ORAL | 0 refills | Status: AC
  Filled 2022-04-23: qty 30, 10d supply, fill #0

## 2022-04-23 NOTE — Progress Notes (Signed)
Chief Complaint: Postop follow-up    Left knee ACL reconstruction with quadriceps tendon autograft and lateral meniscal repair done January 4th, 2023 with manipulation under anesthesia and lysis of adhesions on 02/02/22  History of Present Illness:   04/23/2022: Presents today for follow-up status post the above procedure. He did have a fall approximately 3 weeks prior when his kitchen was flooded by his washing machine.  To that effect he has had residual laxity and feeling like the knee is giving out pain along the lateral joint line.  He states that he did not have any of the symptoms prior to the manipulation or prior to the fall.  He is experiencing more pain about the lateral joint line.  At the last visit he was complaining predominantly of proximal tibial base pain and as result I recommended an intra-articular injection to see if this was referred pain from the joint.  He did feel better from this for approximately a week.  To that effect we planned to order an MRI of the left knee to rule out any type of residual meniscal issue.  Surgical History:   None  PMH/PSH/Family History/Social History/Meds/Allergies:    Past Medical History:  Diagnosis Date   Allergy    Asthma    out grown now    Past Surgical History:  Procedure Laterality Date   KNEE ARTHROSCOPY Left 02/02/2022   Procedure: LEFT ARTHROSCOPY KNEE LYSIS OF ADHESION AND MANIPULATION UNDER ANESTHESIA;  Surgeon: [name], MD;  Location: [facility];  Service: Orthopedics;  Laterality: Left;   KNEE ARTHROSCOPY WITH ANTERIOR CRUCIATE LIGAMENT (ACL) REPAIR WITH HAMSTRING GRAFT Left 11/08/2021   Procedure: LEFT KNEE ANTERIOR CRUCIATE LIGAMENT RECONSTRUCTION WITH QUADRICEPS TENDON AUTOGRAFT;  Surgeon: [name], MD;  Location: [facility];  Service: Orthopedics;  Laterality: Left;   KNEE ARTHROSCOPY WITH MENISCAL REPAIR  11/08/2021   Procedure: LEFT LATERAL  MENISCAL REPAIR;  Surgeon: [name], MD;  Location: [facility];  Service: Orthopedics;;   WISDOM TOOTH EXTRACTION  2022   Social History   Socioeconomic History   Marital status: Single    Spouse name: Not on file   Number of children: 1   Years of education: Not on file   Highest education level: Not on file  Occupational History   Occupation: Student    Comment: [employer]  Tobacco Use   Smoking status: Never   Smokeless tobacco: Never  Vaping Use   Vaping Use: Never used  Substance and Sexual Activity   Alcohol use: Not Currently    Comment: rare   Drug use: Yes    Types: Marijuana    Comment: [employer] 8 THC   Sexual activity: Yes  Other Topics Concern   Not on file  Social History Narrative   Not on file   Social Determinants of Health   Financial Resource Strain: Not on file  Food Insecurity: Not on file  Transportation Needs: Not on file  Physical Activity: Not on file  Stress: Not on file  Social Connections: Not on file   Family History  Problem Relation Age of Onset   Obesity Mother    Hyperlipidemia Father    Obesity Father    Obesity Maternal Grandmother    Stroke Maternal Grandfather    Diabetes Paternal Grandmother  Allergies  Allergen Reactions   Other Swelling    All citrus fruit with the exception of lime   Current Outpatient Medications  Medication Sig Dispense Refill   acetaminophen (TYLENOL) 500 MG tablet Take 1 tablet (500 mg total) by mouth every 8 (eight) hours for 10 days. 30 tablet 0   aspirin EC 325 MG tablet Take 1 tablet (325 mg total) by mouth daily. 30 tablet 0   ibuprofen (ADVIL) 800 MG tablet Take 1 tablet (800 mg total) by mouth every 8 (eight) hours for 10 days. Please take with food, please alternate with acetaminophen 30 tablet 0   oxycodone (OXY-IR) 5 MG capsule Take 1 capsule (5 mg total) by mouth every 4 (four) hours as needed (severe pain). 20 capsule 0   EPINEPHrine 0.3 mg/0.3 mL IJ SOAJ injection  Inject 0.3 mg into the muscle as needed for anaphylaxis. 2 each 1   oxyCODONE (OXY IR/ROXICODONE) 5 MG immediate release tablet Take 1 tablet (5 mg total) by mouth every 4 (four) hours as needed (severe pain). 20 tablet 0   No current facility-administered medications for this visit.   MR Knee Left  Wo Contrast  Result Date: 04/22/2022 CLINICAL DATA:  Left knee pain. Lateral pain. Twisting injury 10/09/2022 EXAM: MRI OF THE LEFT KNEE WITHOUT CONTRAST TECHNIQUE: Multiplanar, multisequence MR imaging of the knee was performed. No intravenous contrast was administered. COMPARISON:  10/11/2021 FINDINGS: MENISCI Medial: Intact. Lateral: Severe attenuation of the posterior horn of the lateral meniscus consistent with prior meniscectomy. Complex signal in the body of the lateral meniscus concerning for a complex tear. LIGAMENTS Cruciates: Prior ACL repair. Complete tear of the ACL graft. Intermediate signal anterior to the distal ACL insertion which may reflect a torn ACL fibers versus fibrosis. Intact PCL. Collaterals: Medial collateral ligament is intact. Lateral collateral ligament complex is intact. CARTILAGE Patellofemoral:  No chondral defect. Medial:  No chondral defect. Lateral: Focal partial-thickness cartilage loss of the lateral tibial plateau with subchondral reactive marrow edema. Partial-thickness cartilage loss of the lateral femoral condyle. JOINT: Large joint effusion. Normal Hoffa's fat-pad. Thickened medial patellar plica. POPLITEAL FOSSA: Popliteus tendon is intact. No Baker's cyst. EXTENSOR MECHANISM: Intact quadriceps tendon. Intact patellar tendon. Intact lateral patellar retinaculum. Intact medial patellar retinaculum. Intact MPFL. BONES: No aggressive osseous lesion. No fracture or dislocation. Mild marrow edema in the periphery of the anteromedial femoral condyle, lateral tibial plateau and anterolateral femoral condyle most consistent with osseous contusions. Other: No fluid collection or  hematoma. Muscles are normal. IMPRESSION: 1. Prior ACL repair. Complete tear of the ACL graft. Soft tissue anterior to the distal ACL insertion which may reflect a torn ACL fibers versus fibrosis. 2. Severe attenuation of the posterior horn of the lateral meniscus consistent with prior meniscectomy. Complex signal in the body of the lateral meniscus concerning for a complex tear. 3. Focal partial-thickness cartilage loss of the lateral tibial plateau with subchondral reactive marrow edema. Partial-thickness cartilage loss of the lateral femoral condyle. 4. Large joint effusion. Electronically Signed   By: Elige Ko M.D.   On: 04/22/2022 08:11    Review of Systems:   A ROS was performed including pertinent positives and negatives as documented in the HPI.  Physical Exam :   Constitutional: NAD and appears stated age Neurological: Alert and oriented Psych: Appropriate affect and cooperative There were no vitals taken for this visit.   Comprehensive Musculoskeletal Exam:     Incisions are well-healed.  Range of motion is from 0 to  125 degrees.  There is a grade 2A Lachman on the left.  Negative pivot shift.  No varus or valgus laxity at 0 or 30 degrees.  There is lateral joint line tenderness with a positive McMurray.  Alignment is neutral.   Imaging:    X-ray 4 views left knee: Status post ACL reconstruction with posterior tibial slope measured at 3 degrees  MRI left knee: MRI of the left knee shows a complete rerupture of his ACL graft with a poorly healed and torn lateral meniscus at the root posteriorly with lateral meniscal extrusion  I personally reviewed and interpreted the radiographs.   Assessment:   21 year old male status post ACL reconstruction with quadriceps tendon autograft as well as lateral meniscal tear repair.  Unfortunately he did have a subsequent fall after his manipulation and lysis of adhesions and since that time has had instability and pain about the lateral  joint.  To that effect I was uncertain whether or not this was coming from the knee or lower down in the proximal tibia.  He did get good relief from an injection which is led me to believe that his pain source is coming from the knee following his fall.  Flat affect I ordered an MRI of the left knee and this did show a complete rerupture of his ACL graft as well as lateral meniscal extrusion and tearing.  That effect I do believe that he would benefit from revision surgery to reconstruct his ACL.  At this time given the fact that he has been prone to falling directly on the knee I have advised against a patella autograft.  Graft options would be a contralateral quadriceps versus hamstring versus allograft.  After discussion of all of the risks and benefits he has elected for quadriceps allograft.  We will also plan for a lateral meniscal root repair and I do believe that he would benefit from a centralization of the meniscus given the fact that there is still persistent meniscal extrusion.  We did discuss nonoperative management and bracing of his root tear although he continues to have persistent lateral based knee pain which is making it very hard for him to function.  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  As result he has elected for surgery. Plan :    -Plan for left knee arthroscopy with ACL reconstruction and lateral meniscal repair   After a lengthy discussion of treatment options, including risks, benefits, alternatives, complications of surgical and nonsurgical conservative options, the patient elected surgical repair.   The patient  is aware of the material risks  and complications including, but not limited to injury to adjacent structures, neurovascular injury, infection, numbness, bleeding, implant failure, thermal burns, stiffness, persistent pain, failure to heal, disease transmission from  allograft, need for further surgery, dislocation, anesthetic risks, blood clots, risks of death,and others. The probabilities of surgical success and failure discussed with patient given their particular co-morbidities.The time and nature of expected rehabilitation and recovery was discussed.The patient's questions were all answered preoperatively.  No barriers to understanding were noted. I explained the natural history of the disease process and Rx rationale.  I explained to the patient what I considered to be reasonable expectations given their personal situation.  The final treatment plan was arrived at through a shared patient decision making process model.       I personally saw  and evaluated the patient, and participated in the management and treatment plan.  [name], MD Attending Physician, Orthopedic Surgery  This document was dictated using Dragon voice recognition software. A reasonable attempt at proof reading has been made to minimize errors.

## 2022-04-24 ENCOUNTER — Other Ambulatory Visit (HOSPITAL_BASED_OUTPATIENT_CLINIC_OR_DEPARTMENT_OTHER): Payer: Self-pay | Admitting: Orthopaedic Surgery

## 2022-04-24 ENCOUNTER — Encounter (HOSPITAL_BASED_OUTPATIENT_CLINIC_OR_DEPARTMENT_OTHER): Payer: Self-pay

## 2022-04-24 ENCOUNTER — Ambulatory Visit: Payer: No Typology Code available for payment source

## 2022-04-24 DIAGNOSIS — T8489XA Other specified complication of internal orthopedic prosthetic devices, implants and grafts, initial encounter: Secondary | ICD-10-CM

## 2022-04-24 NOTE — Progress Notes (Deleted)
error 

## 2022-04-26 ENCOUNTER — Ambulatory Visit: Payer: No Typology Code available for payment source

## 2022-04-26 ENCOUNTER — Telehealth: Payer: Self-pay | Admitting: Physical Therapy

## 2022-04-26 NOTE — Telephone Encounter (Signed)
Returned patients call. LVM to call back °

## 2022-04-27 ENCOUNTER — Encounter (HOSPITAL_COMMUNITY): Payer: Self-pay | Admitting: Orthopaedic Surgery

## 2022-04-30 ENCOUNTER — Ambulatory Visit (HOSPITAL_BASED_OUTPATIENT_CLINIC_OR_DEPARTMENT_OTHER): Payer: No Typology Code available for payment source | Admitting: Anesthesiology

## 2022-04-30 ENCOUNTER — Ambulatory Visit (HOSPITAL_COMMUNITY): Payer: No Typology Code available for payment source | Admitting: Anesthesiology

## 2022-04-30 ENCOUNTER — Ambulatory Visit (HOSPITAL_COMMUNITY): Payer: No Typology Code available for payment source

## 2022-04-30 ENCOUNTER — Encounter (HOSPITAL_COMMUNITY)
Admission: RE | Disposition: A | Discharge status: Home / Self Care | Payer: Self-pay | Source: Home / Self Care | Attending: Orthopaedic Surgery

## 2022-04-30 ENCOUNTER — Other Ambulatory Visit: Payer: Self-pay

## 2022-04-30 ENCOUNTER — Ambulatory Visit (HOSPITAL_COMMUNITY)
Admission: RE | Admit: 2022-04-30 | Discharge: 2022-04-30 | Disposition: A | Discharge status: Home / Self Care | Payer: No Typology Code available for payment source | Attending: Orthopaedic Surgery | Admitting: Orthopaedic Surgery

## 2022-04-30 ENCOUNTER — Encounter (HOSPITAL_COMMUNITY): Payer: Self-pay | Admitting: Orthopaedic Surgery

## 2022-04-30 DIAGNOSIS — W19XXXA Unspecified fall, initial encounter: Secondary | ICD-10-CM | POA: Diagnosis not present

## 2022-04-30 DIAGNOSIS — M23612 Other spontaneous disruption of anterior cruciate ligament of left knee: Secondary | ICD-10-CM

## 2022-04-30 DIAGNOSIS — S83512A Sprain of anterior cruciate ligament of left knee, initial encounter: Secondary | ICD-10-CM | POA: Insufficient documentation

## 2022-04-30 DIAGNOSIS — T8489XA Other specified complication of internal orthopedic prosthetic devices, implants and grafts, initial encounter: Secondary | ICD-10-CM

## 2022-04-30 DIAGNOSIS — Y92000 Kitchen of unspecified non-institutional (private) residence as  the place of occurrence of the external cause: Secondary | ICD-10-CM | POA: Insufficient documentation

## 2022-04-30 DIAGNOSIS — I10 Essential (primary) hypertension: Secondary | ICD-10-CM | POA: Diagnosis not present

## 2022-04-30 DIAGNOSIS — S83282A Other tear of lateral meniscus, current injury, left knee, initial encounter: Secondary | ICD-10-CM | POA: Diagnosis not present

## 2022-04-30 DIAGNOSIS — Z6841 Body Mass Index (BMI) 40.0 and over, adult: Secondary | ICD-10-CM | POA: Diagnosis not present

## 2022-04-30 DIAGNOSIS — S83272A Complex tear of lateral meniscus, current injury, left knee, initial encounter: Secondary | ICD-10-CM

## 2022-04-30 DIAGNOSIS — S83242A Other tear of medial meniscus, current injury, left knee, initial encounter: Secondary | ICD-10-CM | POA: Insufficient documentation

## 2022-04-30 DIAGNOSIS — Z9889 Other specified postprocedural states: Secondary | ICD-10-CM | POA: Insufficient documentation

## 2022-04-30 HISTORY — DX: Other specified postprocedural states: R11.2

## 2022-04-30 HISTORY — DX: Other specified postprocedural states: Z98.890

## 2022-04-30 HISTORY — PX: KNEE ARTHROSCOPY WITH ANTERIOR CRUCIATE LIGAMENT (ACL) REPAIR WITH HAMSTRING GRAFT: SHX5645

## 2022-04-30 LAB — CBC
HCT: 44.9 % (ref 39.0–52.0)
Hemoglobin: 14.9 g/dL (ref 13.0–17.0)
MCH: 27.5 pg (ref 26.0–34.0)
MCHC: 33.2 g/dL (ref 30.0–36.0)
MCV: 83 fL (ref 80.0–100.0)
Platelets: 161 10*3/uL (ref 150–400)
RBC: 5.41 MIL/uL (ref 4.22–5.81)
RDW: 13.2 % (ref 11.5–15.5)
WBC: 7.5 10*3/uL (ref 4.0–10.5)
nRBC: 0 % (ref 0.0–0.2)

## 2022-04-30 SURGERY — KNEE ARTHROSCOPY WITH ANTERIOR CRUCIATE LIGAMENT (ACL) REPAIR WITH HAMSTRING GRAFT
Anesthesia: Regional | Site: Knee | Laterality: Left

## 2022-04-30 MED ORDER — MEPERIDINE HCL 25 MG/ML IJ SOLN: 6.2500 mg | INTRAMUSCULAR | Status: DC | PRN

## 2022-04-30 MED ORDER — VANCOMYCIN HCL 1000 MG IV SOLR
INTRAVENOUS | Status: DC | PRN
  Administered 2022-04-30: 1000 mg via TOPICAL

## 2022-04-30 MED ORDER — HYDROMORPHONE HCL 1 MG/ML IJ SOLN
INTRAMUSCULAR | Status: AC
  Filled 2022-04-30: qty 1

## 2022-04-30 MED ORDER — ACETAMINOPHEN 500 MG PO TABS
1000.0000 mg | ORAL_TABLET | Freq: Once | ORAL | Status: AC
  Administered 2022-04-30: 1000 mg via ORAL
  Filled 2022-04-30: qty 2

## 2022-04-30 MED ORDER — DEXMEDETOMIDINE (PRECEDEX) IN NS 20 MCG/5ML (4 MCG/ML) IV SYRINGE
PREFILLED_SYRINGE | INTRAVENOUS | Status: DC | PRN
  Administered 2022-04-30 (×3): 8 ug via INTRAVENOUS

## 2022-04-30 MED ORDER — AMISULPRIDE (ANTIEMETIC) 5 MG/2ML IV SOLN: 10.0000 mg | Freq: Once | INTRAVENOUS | Status: DC | PRN

## 2022-04-30 MED ORDER — HYDROMORPHONE HCL 1 MG/ML IJ SOLN
INTRAMUSCULAR | Status: AC
  Filled 2022-04-30: qty 0.5

## 2022-04-30 MED ORDER — FENTANYL CITRATE (PF) 250 MCG/5ML IJ SOLN
INTRAMUSCULAR | Status: AC
  Filled 2022-04-30: qty 5

## 2022-04-30 MED ORDER — HYDROMORPHONE HCL 1 MG/ML IJ SOLN
INTRAMUSCULAR | Status: DC | PRN
  Administered 2022-04-30: .5 mg via INTRAVENOUS

## 2022-04-30 MED ORDER — KETOROLAC TROMETHAMINE 15 MG/ML IJ SOLN
INTRAMUSCULAR | Status: AC
  Administered 2022-04-30: 15 mg
  Filled 2022-04-30: qty 1

## 2022-04-30 MED ORDER — 0.9 % SODIUM CHLORIDE (POUR BTL) OPTIME
TOPICAL | Status: DC | PRN
  Administered 2022-04-30: 1000 mL

## 2022-04-30 MED ORDER — BUPIVACAINE-EPINEPHRINE (PF) 0.25% -1:200000 IJ SOLN
INTRAMUSCULAR | Status: AC
  Filled 2022-04-30: qty 30

## 2022-04-30 MED ORDER — DEXAMETHASONE SODIUM PHOSPHATE 10 MG/ML IJ SOLN
INTRAMUSCULAR | Status: DC | PRN
  Administered 2022-04-30: 10 mg

## 2022-04-30 MED ORDER — EPINEPHRINE PF 1 MG/ML IJ SOLN
INTRAMUSCULAR | Status: AC
Start: 2022-04-30 — End: ?
  Filled 2022-04-30: qty 2

## 2022-04-30 MED ORDER — LIDOCAINE 2% (20 MG/ML) 5 ML SYRINGE
INTRAMUSCULAR | Status: DC | PRN
  Administered 2022-04-30: 30 mg via INTRAVENOUS

## 2022-04-30 MED ORDER — MIDAZOLAM HCL 2 MG/2ML IJ SOLN: 2.0000 mg | Freq: Once | INTRAMUSCULAR | Status: AC

## 2022-04-30 MED ORDER — SODIUM CHLORIDE 0.9 % IR SOLN
Status: DC | PRN
  Administered 2022-04-30 (×5): 3000 mL

## 2022-04-30 MED ORDER — PROPOFOL 10 MG/ML IV BOLUS
INTRAVENOUS | Status: DC | PRN
  Administered 2022-04-30: 200 mg via INTRAVENOUS

## 2022-04-30 MED ORDER — PROPOFOL 10 MG/ML IV BOLUS
INTRAVENOUS | Status: AC
  Filled 2022-04-30: qty 20

## 2022-04-30 MED ORDER — FENTANYL CITRATE (PF) 100 MCG/2ML IJ SOLN: 100.0000 ug | Freq: Once | INTRAMUSCULAR | Status: AC

## 2022-04-30 MED ORDER — FENTANYL CITRATE (PF) 250 MCG/5ML IJ SOLN
INTRAMUSCULAR | Status: DC | PRN
Start: 2022-04-30 — End: 2022-04-30
  Administered 2022-04-30: 50 ug via INTRAVENOUS
  Administered 2022-04-30: 100 ug via INTRAVENOUS
  Administered 2022-04-30 (×2): 50 ug via INTRAVENOUS

## 2022-04-30 MED ORDER — OXYCODONE HCL 5 MG/5ML PO SOLN: 5.0000 mg | Freq: Once | ORAL | Status: AC | PRN

## 2022-04-30 MED ORDER — CHLORHEXIDINE GLUCONATE 0.12 % MT SOLN
15.0000 mL | OROMUCOSAL | Status: AC
  Filled 2022-04-30: qty 15

## 2022-04-30 MED ORDER — ONDANSETRON HCL 4 MG/2ML IJ SOLN: 4.0000 mg | Freq: Once | INTRAMUSCULAR | Status: DC | PRN

## 2022-04-30 MED ORDER — MORPHINE SULFATE (PF) 4 MG/ML IV SOLN
INTRAVENOUS | Status: AC
  Filled 2022-04-30: qty 2

## 2022-04-30 MED ORDER — DEXAMETHASONE SODIUM PHOSPHATE 10 MG/ML IJ SOLN
INTRAMUSCULAR | Status: AC
Start: 2022-04-30 — End: ?
  Filled 2022-04-30: qty 1

## 2022-04-30 MED ORDER — GABAPENTIN 300 MG PO CAPS
300.0000 mg | ORAL_CAPSULE | Freq: Once | ORAL | Status: AC
  Administered 2022-04-30: 300 mg via ORAL
  Filled 2022-04-30: qty 1

## 2022-04-30 MED ORDER — FENTANYL CITRATE (PF) 100 MCG/2ML IJ SOLN
INTRAMUSCULAR | Status: AC
  Administered 2022-04-30: 100 ug via INTRAVENOUS
  Filled 2022-04-30: qty 2

## 2022-04-30 MED ORDER — DEXAMETHASONE SODIUM PHOSPHATE 10 MG/ML IJ SOLN
INTRAMUSCULAR | Status: AC
  Filled 2022-04-30: qty 1

## 2022-04-30 MED ORDER — CLONIDINE HCL (ANALGESIA) 100 MCG/ML EP SOLN
EPIDURAL | Status: AC
Start: 2022-04-30 — End: ?
  Filled 2022-04-30: qty 10

## 2022-04-30 MED ORDER — ONDANSETRON HCL 4 MG/2ML IJ SOLN
INTRAMUSCULAR | Status: DC | PRN
  Administered 2022-04-30: 4 mg via INTRAVENOUS

## 2022-04-30 MED ORDER — MIDAZOLAM HCL 2 MG/2ML IJ SOLN
INTRAMUSCULAR | Status: AC
  Administered 2022-04-30: 2 mg via INTRAVENOUS
  Filled 2022-04-30: qty 2

## 2022-04-30 MED ORDER — ONDANSETRON HCL 4 MG/2ML IJ SOLN
INTRAMUSCULAR | Status: AC
Start: 2022-04-30 — End: ?
  Filled 2022-04-30: qty 2

## 2022-04-30 MED ORDER — VANCOMYCIN HCL 1000 MG IV SOLR
INTRAVENOUS | Status: AC
  Filled 2022-04-30: qty 20

## 2022-04-30 MED ORDER — ACETAMINOPHEN 500 MG PO TABS: 1000.0000 mg | ORAL_TABLET | Freq: Once | ORAL | Status: DC

## 2022-04-30 MED ORDER — KETOROLAC TROMETHAMINE 30 MG/ML IJ SOLN: 30.0000 mg | Freq: Once | INTRAMUSCULAR | Status: DC | PRN

## 2022-04-30 MED ORDER — OXYCODONE HCL 5 MG PO TABS
5.0000 mg | ORAL_TABLET | Freq: Once | ORAL | Status: AC | PRN
  Administered 2022-04-30: 5 mg via ORAL

## 2022-04-30 MED ORDER — CHLORHEXIDINE GLUCONATE 0.12 % MT SOLN
OROMUCOSAL | Status: AC
  Administered 2022-04-30: 15 mL
  Filled 2022-04-30: qty 15

## 2022-04-30 MED ORDER — OXYCODONE HCL 5 MG PO TABS
ORAL_TABLET | ORAL | Status: AC
  Filled 2022-04-30: qty 1

## 2022-04-30 MED ORDER — CEFAZOLIN IN SODIUM CHLORIDE 3-0.9 GM/100ML-% IV SOLN
3.0000 g | INTRAVENOUS | Status: AC
  Administered 2022-04-30: 3 g via INTRAVENOUS
  Filled 2022-04-30: qty 100

## 2022-04-30 MED ORDER — HYDROMORPHONE HCL 1 MG/ML IJ SOLN
0.2500 mg | INTRAMUSCULAR | Status: DC | PRN
  Administered 2022-04-30 (×2): 0.5 mg via INTRAVENOUS

## 2022-04-30 MED ORDER — TRANEXAMIC ACID-NACL 1000-0.7 MG/100ML-% IV SOLN
1000.0000 mg | INTRAVENOUS | Status: AC
  Administered 2022-04-30: 1000 mg via INTRAVENOUS
  Filled 2022-04-30: qty 100

## 2022-04-30 MED ORDER — LIDOCAINE 2% (20 MG/ML) 5 ML SYRINGE
INTRAMUSCULAR | Status: AC
  Filled 2022-04-30: qty 5

## 2022-04-30 MED ORDER — SODIUM CHLORIDE 0.9 % IR SOLN
Status: DC | PRN
  Administered 2022-04-30 (×3): 3000 mL

## 2022-04-30 MED ORDER — DEXAMETHASONE SODIUM PHOSPHATE 10 MG/ML IJ SOLN
INTRAMUSCULAR | Status: DC | PRN
  Administered 2022-04-30: 5 mg via INTRAVENOUS

## 2022-04-30 MED ORDER — LACTATED RINGERS IV SOLN: INTRAVENOUS | Status: DC

## 2022-04-30 MED ORDER — LIDOCAINE 2% (20 MG/ML) 5 ML SYRINGE
INTRAMUSCULAR | Status: AC
Start: 2022-04-30 — End: ?
  Filled 2022-04-30: qty 5

## 2022-04-30 MED ORDER — ROPIVACAINE HCL 5 MG/ML IJ SOLN
INTRAMUSCULAR | Status: DC | PRN
  Administered 2022-04-30: 30 mL via PERINEURAL

## 2022-04-30 SURGICAL SUPPLY — 103 items
ALCOHOL 70% 16 OZ (MISCELLANEOUS) ×3 IMPLANT
ANCH SUT 2 FBRTK KNTLS 1.8 (Anchor) ×1 IMPLANT
ANCHOR BUTTON TIGHTROPE 14 (Anchor) ×1 IMPLANT
ANCHOR SUT 1.8 FIBERTAK SB KL (Anchor) ×1 IMPLANT
APL PRP STRL LF DISP 70% ISPRP (MISCELLANEOUS) ×2
BAG COUNTER SPONGE SURGICOUNT (BAG) IMPLANT
BAG SPNG CNTER NS LX DISP (BAG)
BIT DRILL CANN 10.5 (DRILL) IMPLANT
BLADE EXCALIBUR 4.0X13 (MISCELLANEOUS) ×3 IMPLANT
BLADE SURG 10 STRL SS (BLADE) IMPLANT
BLADE SURG 15 STRL LF DISP TIS (BLADE) ×4 IMPLANT
BLADE SURG 15 STRL SS (BLADE) ×4
BNDG CMPR MED 15X6 ELC VLCR LF (GAUZE/BANDAGES/DRESSINGS) ×1
BNDG ELASTIC 6X15 VLCR STRL LF (GAUZE/BANDAGES/DRESSINGS) ×3 IMPLANT
BNDG GAUZE DERMACEA FLUFF (GAUZE/BANDAGES/DRESSINGS) ×1
BNDG GAUZE DERMACEA FLUFF 4 (GAUZE/BANDAGES/DRESSINGS) IMPLANT
BNDG GZE DERMACEA 4 6PLY (GAUZE/BANDAGES/DRESSINGS) ×1
CHLORAPREP W/TINT 26 (MISCELLANEOUS) ×6 IMPLANT
COOLER ICEMAN CLASSIC (MISCELLANEOUS) ×4 IMPLANT
COVER MAYO STAND STRL (DRAPES) ×3 IMPLANT
COVER SURGICAL LIGHT HANDLE (MISCELLANEOUS) ×3 IMPLANT
CUFF TOURN SGL QUICK 34 (TOURNIQUET CUFF) ×2
CUFF TRNQT CYL 34X4.125X (TOURNIQUET CUFF) IMPLANT
DRAPE ARTHROSCOPY W/POUCH 114 (DRAPES) ×3 IMPLANT
DRAPE INCISE IOBAN 66X45 STRL (DRAPES) ×3 IMPLANT
DRAPE OEC MINIVIEW 54X84 (DRAPES) IMPLANT
DRAPE ORTHO SPLIT 77X108 STRL (DRAPES) ×2
DRAPE SURG ORHT 6 SPLT 77X108 (DRAPES) ×2 IMPLANT
DRAPE U-SHAPE 47X51 STRL (DRAPES) ×3 IMPLANT
DRILL CANN 10.5 (DRILL) ×2
DRSG PAD ABDOMINAL 8X10 ST (GAUZE/BANDAGES/DRESSINGS) ×2 IMPLANT
DRSG TEGADERM 4X4.75 (GAUZE/BANDAGES/DRESSINGS) ×12 IMPLANT
DRSG TELFA 3X8 NADH (GAUZE/BANDAGES/DRESSINGS) ×4 IMPLANT
DW OUTFLOW CASSETTE/TUBE SET (MISCELLANEOUS) IMPLANT
ELECT REM PT RETURN 9FT ADLT (ELECTROSURGICAL) ×2
ELECTRODE REM PT RTRN 9FT ADLT (ELECTROSURGICAL) ×2 IMPLANT
EXCALIBUR 3.8MM X 13CM (MISCELLANEOUS) ×5 IMPLANT
GAUZE SPONGE 4X4 12PLY STRL LF (GAUZE/BANDAGES/DRESSINGS) ×3 IMPLANT
GAUZE XEROFORM 1X8 LF (GAUZE/BANDAGES/DRESSINGS) ×4 IMPLANT
GLOVE BIOGEL PI IND STRL 8 (GLOVE) ×2 IMPLANT
GLOVE BIOGEL PI INDICATOR 8 (GLOVE) ×1
GLOVE ECLIPSE 8.0 STRL XLNG CF (GLOVE) ×3 IMPLANT
GLOVE ORTHO TXT STRL SZ7.5 (GLOVE) ×3 IMPLANT
GLOVE SURG ENC MOIS LTX SZ6 (GLOVE) ×11 IMPLANT
GLOVE SURG LTX SZ8 (GLOVE) ×3 IMPLANT
GLOVE SURG SYN 7.5  E (GLOVE) ×6
GLOVE SURG SYN 7.5 E (GLOVE) ×3 IMPLANT
GLOVE SURG SYN 7.5 PF PI (GLOVE) ×6 IMPLANT
GLOVE SURG UNDER POLY LF SZ6.5 (GLOVE) ×6 IMPLANT
GOWN STRL REUS W/ TWL LRG LVL3 (GOWN DISPOSABLE) ×6 IMPLANT
GOWN STRL REUS W/TWL LRG LVL3 (GOWN DISPOSABLE) ×10
GRAFT TISS QUADRICEP TEND 9-11 (Tissue) IMPLANT
IMMOBILIZER KNEE 22 UNIV (SOFTGOODS) ×3 IMPLANT
IMP SYS 2ND FIX PEEK 4.75X19.1 (Miscellaneous) ×4 IMPLANT
IMPL SYS 2ND FX PEEK 4.75X19.1 (Miscellaneous) IMPLANT
KIT BASIN OR (CUSTOM PROCEDURE TRAY) ×3 IMPLANT
KIT ROOT REPAIR MEINISCAL PEEK (Anchor) IMPLANT
KIT TRANSTIBIAL (DISPOSABLE) ×1 IMPLANT
KIT TURNOVER KIT B (KITS) ×3 IMPLANT
MANIFOLD NEPTUNE II (INSTRUMENTS) ×3 IMPLANT
MEINISCAL ROOT REPAIR KIT PEEK (Anchor) ×2 IMPLANT
NDL 18GX1X1/2 (RX/OR ONLY) (NEEDLE) ×2 IMPLANT
NDL HYPO 18GX1.5 BLUNT FILL (NEEDLE) ×2 IMPLANT
NEEDLE 18GX1X1/2 (RX/OR ONLY) (NEEDLE) ×2 IMPLANT
NEEDLE HYPO 18GX1.5 BLUNT FILL (NEEDLE) ×2 IMPLANT
NS IRRIG 1000ML POUR BTL (IV SOLUTION) ×3 IMPLANT
PACK ARTHROSCOPY DSU (CUSTOM PROCEDURE TRAY) ×3 IMPLANT
PAD ARMBOARD 7.5X6 YLW CONV (MISCELLANEOUS) ×6 IMPLANT
PAD CAST 4YDX4 CTTN HI CHSV (CAST SUPPLIES) ×2 IMPLANT
PAD COLD SHLDR WRAP-ON (PAD) ×4 IMPLANT
PAD DRESSING TELFA 3X8 NADH (GAUZE/BANDAGES/DRESSINGS) ×4 IMPLANT
PADDING CAST COTTON 4X4 STRL (CAST SUPPLIES)
PADDING CAST COTTON 6X4 STRL (CAST SUPPLIES) ×4 IMPLANT
PENCIL BUTTON HOLSTER BLD 10FT (ELECTRODE) ×3 IMPLANT
PORT APPOLLO RF 90DEGREE MULTI (SURGICAL WAND) ×1 IMPLANT
PROBE APOLLO 90XL (SURGICAL WAND) IMPLANT
SPIKE FLUID TRANSFER (MISCELLANEOUS) ×2 IMPLANT
SPONGE T-LAP 18X18 ~~LOC~~+RFID (SPONGE) ×2 IMPLANT
SPONGE T-LAP 4X18 ~~LOC~~+RFID (SPONGE) ×6 IMPLANT
STRIP CLOSURE SKIN 1/2X4 (GAUZE/BANDAGES/DRESSINGS) ×6 IMPLANT
SUCTION FRAZIER HANDLE 10FR (MISCELLANEOUS) ×2
SUCTION TUBE FRAZIER 10FR DISP (MISCELLANEOUS) ×2 IMPLANT
SUT ETHILON 3 0 FSL (SUTURE) ×2 IMPLANT
SUT ETHILON 3 0 PS 1 (SUTURE) ×6 IMPLANT
SUT MNCRL AB 3-0 PS2 18 (SUTURE) ×3 IMPLANT
SUT VIC AB 0 CT1 27 (SUTURE) ×2
SUT VIC AB 0 CT1 27XBRD ANBCTR (SUTURE) ×2 IMPLANT
SUT VIC AB 2-0 CT1 27 (SUTURE) ×2
SUT VIC AB 2-0 CT1 TAPERPNT 27 (SUTURE) ×2 IMPLANT
SUT VICRYL 0 UR6 27IN ABS (SUTURE) ×3 IMPLANT
SYR 30ML LL (SYRINGE) ×3 IMPLANT
SYR 3ML LL SCALE MARK (SYRINGE) ×3 IMPLANT
SYR BULB IRRIG 60ML STRL (SYRINGE) ×3 IMPLANT
SYR TB 1ML LUER SLIP (SYRINGE) ×3 IMPLANT
SYS ALLOGRAFT GRAFTLINK CP2 (Anchor) ×2 IMPLANT
SYSTEM ALLOGRAFT GRAFTLINK CP2 (Anchor) IMPLANT
TISSUE FLEXIGRAFT QUADLINK (Tissue) ×2 IMPLANT
TOWEL GREEN STERILE (TOWEL DISPOSABLE) ×3 IMPLANT
TOWEL GREEN STERILE FF (TOWEL DISPOSABLE) ×3 IMPLANT
TUBING ARTHROSCOPY IRRIG 16FT (MISCELLANEOUS) ×3 IMPLANT
UNDERPAD 30X36 HEAVY ABSORB (UNDERPADS AND DIAPERS) ×3 IMPLANT
WRAP KNEE MAXI GEL POST OP (GAUZE/BANDAGES/DRESSINGS) ×3 IMPLANT
YANKAUER SUCT BULB TIP NO VENT (SUCTIONS) ×3 IMPLANT

## 2022-04-30 NOTE — Anesthesia Preprocedure Evaluation (Addendum)
Anesthesia Evaluation  Patient identified by MRN, date of birth, ID band Patient awake    Reviewed: Allergy & Precautions, NPO status , Patient's Chart, lab work & pertinent test results  History of Anesthesia Complications Negative for: history of anesthetic complications  Airway Mallampati: III  TM Distance: >3 FB Neck ROM: Full    Dental  (+) Teeth Intact, Dental Advisory Given   Pulmonary asthma (childhood) ,    Pulmonary exam normal breath sounds clear to auscultation       Cardiovascular hypertension (146/91 in preop, has not been diagnosed with HTN), Normal cardiovascular exam Rhythm:Regular Rate:Normal     Neuro/Psych negative neurological ROS  negative psych ROS   GI/Hepatic negative GI ROS, (+)     substance abuse (daily heavy marijuana use)  marijuana use,   Endo/Other  Morbid obesityBMI 40  Renal/GU negative Renal ROS  negative genitourinary   Musculoskeletal L ACL tear    Abdominal (+) + obese,   Peds  Hematology negative hematology ROS (+)   Anesthesia Other Findings   Reproductive/Obstetrics negative OB ROS                           Anesthesia Physical Anesthesia Plan  ASA: 3  Anesthesia Plan: General and Regional   Post-op Pain Management: Regional block*, Tylenol PO (pre-op)* and Toradol IV (intra-op)*   Induction: Intravenous  PONV Risk Score and Plan: 3 and Ondansetron, Dexamethasone, Midazolam and Treatment may vary due to age or medical condition  Airway Management Planned: LMA  Additional Equipment: None  Intra-op Plan:   Post-operative Plan: Extubation in OR  Informed Consent: I have reviewed the patients History and Physical, chart, labs and discussed the procedure including the risks, benefits and alternatives for the proposed anesthesia with the patient or authorized representative who has indicated his/her understanding and acceptance.      Dental advisory given  Plan Discussed with: CRNA  Anesthesia Plan Comments:        Anesthesia Quick Evaluation

## 2022-05-01 ENCOUNTER — Encounter (HOSPITAL_COMMUNITY): Payer: Self-pay | Admitting: Orthopaedic Surgery

## 2022-05-02 ENCOUNTER — Ambulatory Visit: Payer: No Typology Code available for payment source

## 2022-05-02 ENCOUNTER — Telehealth: Payer: Self-pay | Admitting: Radiology

## 2022-05-02 DIAGNOSIS — M25662 Stiffness of left knee, not elsewhere classified: Secondary | ICD-10-CM

## 2022-05-02 DIAGNOSIS — M25562 Pain in left knee: Secondary | ICD-10-CM

## 2022-05-02 DIAGNOSIS — R262 Difficulty in walking, not elsewhere classified: Secondary | ICD-10-CM

## 2022-05-02 DIAGNOSIS — M6281 Muscle weakness (generalized): Secondary | ICD-10-CM

## 2022-05-02 NOTE — Therapy (Signed)
OUTPATIENT PHYSICAL THERAPY TREATMENT NOTE/ RE-EVALUATION  Patient Name: Dustin Parks MRN: [id number] DOB:[birth year], 21 y.o., male Today's Date: 05/02/2022  PCP: Haydee Salter, MD REFERRING PROVIDER: Dr Donivan Scull    PT End of Session - 05/02/22 1549     Visit Number 40    Number of Visits 12    Date for PT Re-Evaluation 08/01/22    Authorization Type MCE    Authorization Time Period 03/19/2022 - 06/19/2022    Authorization - Visit Number 6    Authorization - Number of Visits 16    PT Start Time 2423    PT Stop Time 1612    PT Time Calculation (min) 42 min    Equipment Utilized During Treatment Other (comment)   axillary crutches and Lt knee brace   Activity Tolerance Patient tolerated treatment well    Behavior During Therapy WFL for tasks assessed/performed                       Past Medical History:  Diagnosis Date   Allergy    Asthma    out grown now    PONV (postoperative nausea and vomiting)    Past Surgical History:  Procedure Laterality Date   KNEE ARTHROSCOPY Left 02/02/2022   Procedure: LEFT ARTHROSCOPY KNEE LYSIS OF ADHESION AND MANIPULATION UNDER ANESTHESIA;  Surgeon: [name], MD;  Location: [place];  Service: Orthopedics;  Laterality: Left;   KNEE ARTHROSCOPY WITH ANTERIOR CRUCIATE LIGAMENT (ACL) REPAIR WITH HAMSTRING GRAFT Left 11/08/2021   Procedure: LEFT KNEE ANTERIOR CRUCIATE LIGAMENT RECONSTRUCTION WITH QUADRICEPS TENDON AUTOGRAFT;  Surgeon: [name], MD;  Location: [place];  Service: Orthopedics;  Laterality: Left;   KNEE ARTHROSCOPY WITH ANTERIOR CRUCIATE LIGAMENT (ACL) REPAIR WITH HAMSTRING GRAFT Left 04/30/2022   Procedure: LEFT KNEE ARTHROSCOPY WITH ANTERIOR CRUCIATE LIGAMENT (ACL) REPAIR WITH QUADRICEPS ALLOGRAFT AND LATERAL MENISCAL REPAIR;  Surgeon: [name], MD;  Location: [place];  Service: Orthopedics;  Laterality: Left;   KNEE ARTHROSCOPY WITH MENISCAL REPAIR  11/08/2021    Procedure: LEFT LATERAL MENISCAL REPAIR;  Surgeon: [name], MD;  Location: [place];  Service: Orthopedics;;   [place] EXTRACTION  2022   Patient Active Problem List   Diagnosis Date Noted   Peripheral tear of lateral meniscus of left knee as current injury    Arthrofibrosis of knee joint, left 01/31/2022   S/P repair of anterior cruciate ligament 01/31/2022   Morbid obesity with BMI of 40.0-44.9, adult ([place]) 01/31/2022   Hypertriglyceridemia 01/31/2022   Angioedema of lips- [place] 12/13/2021   Tear of anterior cruciate ligament graft ([place])    Complex tear of lateral meniscus of left knee as current injury    History of orchitis 02/08/2021     PCP: Libby Maw, MD   REFERRING PROVIDER: [name], MD   REFERRING DIAG:  [phone] (ICD-10-CM) - Rupture of anterior cruciate ligament of left knee, initial encounter  S83.272A (ICD-10-CM) - Complex tear of lateral meniscus of left knee as current injury, initial encounter Z98.890 (ICD-10-CM) - S/P left knee arthroscopy    THERAPY DIAG:  Left knee pain, unspecified chronicity   Stiffness of left knee, not elsewhere classified   Muscle weakness (generalized)   Difficulty in walking, not elsewhere classified   ONSET DATE: Injury 10/09/2021 / Surgery 11/08/2021; subsequent Lt ACL and meniscus tear and repair on 05/02/2022    SUBJECTIVE:  SUBJECTIVE STATEMENT: Pt reports to PT for first time since  re-rupture on his Lt ACL and meniscal tear and subsequent repair on 04/30/2022 with Dr. Sammuel Hines. His current restrictions include TTWB for 2-4 weeks on Lt; he ambulates into clinic with swing-through gait pattern with axillary crutches.  Pt next follow-up with Dr. Sammuel Hines is scheduled for 05/14/2022. 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   PERTINENT HISTORY: 11/08/2021 Left knee ACL reconstruction with quadriceps autograft and lateral meniscal repair.    02/02/2022: Lt knee lysis of adheresion and  manipulation under anesthesia  Allergy to certain adhesives including tegaderm per pt report   Re-rupture of Lt ACL and Lt meniscal tear with subsequent repair on 05/02/2022   PAIN:  Are you having pain? No NPRS scale: 0/10 at rest, 1/10 when pain occurs Pain location: right lateral knee  Pain orientation: Right  PAIN TYPE: aching Pain description: intermittent  Aggravating factors: use of the knee / falling off the bed  Relieving factors: rest     PRECAUTIONS: Other: per Dr. Eddie Dibbles ACL reconstruction with meniscal repair   WEIGHT BEARING RESTRICTIONS Yes      OCCUPATION: in culinary school     PLOF: Independent; Pt was able to perform all of his ADLs/IADLs and functional mobility skills without difficulty or limitations.  Pt was able to perform work activities.  Pt ambulated without an AD with good stability without difficulty.    PATIENT GOALS improved stability with leaning, to have the ability to run, to be able to bowl.  Return to PLOF.      OBJECTIVE:  *Unless otherwise noted, objective measures collected previously*  LE ROM:  A/PROM Right  Left 02/03/2022 Left 02/16/2022 Left 03/08/2022 Left 04/14/2022 Left 05/02/2022  Knee flexion  93 AROM/ 97 AAROM/ 103PROM/ 106 PROM following MET 96 AROM, 102 PROM, 111 following MET 106 AROM, 111 PROM, 118 following MET 117 AROM 78 AROM, 81 AAROM with strap  Knee extension  -1 AROM/ 0 PROM 2 AROM, 4 PROM 2 AROM, 4 PROM 3 AROM -3 AROM   (Blank rows = not tested)  LE MMT:  MMT Right  Left  Left 03/08/2022  Hip flexion  4+/5 5/5  Hip extension     Hip abduction     Knee flexion  5/5 5/5  Knee extension  4+/5 5/5  Ankle dorsiflexion  5/5   Ankle plantarflexion  5/5   Ankle inversion     Ankle eversion      (Blank rows = not tested)   FUNCTIONAL TESTS:  -Dead lift: Good form and depth with no pain    TODAY'S TREATMENT:  [facility] Adult PT Treatment:                                                DATE: 05/02/2022 Therapeutic  Exercise: Seated LAQ 80-40 degrees knee flexion 2x20 Supine Lt knee AAROM with strap x10 Supine quad set x10 with 5-sec hold Manual Therapy: N/A Neuromuscular re-ed: N/A Therapeutic Activity: N/A Modalities: N/A Self Care: N/A   [facility] Adult PT Treatment:                                                DATE: 04/16/2022 Therapeutic Exercise: Recumbent bike L3 x 5 min while taking subjective Modified thomas  stretch with manual overpressure 3 x 30 sec SLR with 3# 3 x 10 Sidelying hip abduction with 3# 3 x 10 Trialed forward heel tap on 4" but patient unable to control Forward heel tap 2" with TRX support x 10 - patient reports lateral knee discomfort with unlocking from knee extension to flexion Standing TKE with blue 2 x 20 SL leg press (cybex) 40# 3 x 10 Manual: STM using roller to VL, ITB, and lateral gluteal regions Sidelying passive gluteal/ITB stretch   [facility] Adult PT Treatment:                                                DATE: 04/14/2022 Therapeutic Exercise: [place] split squats with GTB pulling Lt knee into adduction, 15# kettlebell kettlebell in Lt hand, Rt UE support, 2x10 on Lt Squat hops 3x8 Side shuffle hops 3x20 Seated BIL hamstring curls with 45# cable 3x10 Seated BIL knee extension with 25# cable 3x10 Standing Lt quad stretch x2 min     Education details: HEP Person educated: Patient Education method: Consulting civil engineer, Demonstration, Corporate treasurer cues, Verbal cues Education comprehension: verbalized understanding, returned demonstration, verbal cues required, tactile cues required, and needs further education   HOME EXERCISE PROGRAM: Access Code: [email or web address] URL: [email or web address] Date: 05/02/2022 Prepared by: Vanessa [place]  Exercises - Sidelying Hip Abduction  - 3 x daily - 7 x weekly - 3 sets - 20 reps - Small Range Straight Leg Raise  - 1 x daily - 7 x weekly - 3 sets - 20 reps - Supine Knee Flexion Wall Slide  - 1 x daily - 7 x weekly  - 1 sets - 2 reps - 10 min hold - Heel slide with strap, LAYING ON BACK  - 2 x daily - 7 x weekly - 3 sets - 10 reps - 5-sec hold - Seated Long Arc Quad (90-45 Degree Range)  - 2 x daily - 7 x weekly - 3 sets - 10 reps - 5-sec hold - Supine Quad Set  - 2 x daily - 7 x weekly - 3 sets - 10 reps - 5-sec hold    ASSESSMENT: CLINICAL IMPRESSION: Pt arrives to PT today for first time since 2nd Lt ACL repair and meniscal repair on 05/02/2022 following a fall and re-injury to the Lt knee. Upon re-evaluation, the pt is very limited in global Lt knee ROM and functional mobility. His goals were updated to reflect these changes. He responded well to early post-op ACL repair exercises, and his HEP was updated to include these exercises. He will continue to benefit from skilled PT to address his primary impairments and return to his prior level of function with less limitation.   Objective impairments include Abnormal gait, decreased activity tolerance, decreased balance, decreased mobility, difficulty walking, decreased ROM, decreased strength, hypomobility, increased edema, impaired flexibility, and pain. These impairments are limiting patient from cleaning, community activity, driving, meal prep, occupation, laundry, shopping, school, and ambulation .    GOALS: SHORT TERM GOALS:   STG Name Target Date Goal status  1 Pt will be independent and compliant with HEP for improved pain, strength, and ROM.  Baseline: New HEP administered 05/02/2022 06/13/2022 INITIAL (updated)  2 Pt will demo a good quad set and perform supine SLRs independently with brace for improved quad strength. Baseline: Pain with quad setting/ SLR 06/13/2022 INITIAL (updated)  3  Pt will demo improved improved L knee extension PROM/AROM to 0/3 and flexion PROM to 60 deg for improved mobility and stiffness. Baseline: -3 to 78 degrees AROM 06/13/2022 INITIAL (updated)  4 Pt will demo improved improved L knee extension AROM to 0 deg and  flexion  AAROM/PROM to 80/90 deg for improved mobility and stiffness. Baseline: -3 to 78 degrees AROM 06/13/2022 INITIAL (Updated)  5 Pt will demo L knee AROM to be 0 - 120 deg for improved stiffness and performance of functional mobility.   Baseline: -3 to 78 degrees AROM  06/13/2022 INITIAL (Updated)  6 Pt will progress Wb'ing with gait per MD orders and protocol without adverse effects.  Baseline: 06/13/2022 INITIAL (updated)  7 Pt will progress with closed chain exercises per protocol without adverse effects for improved functional strength and stability.  Baseline: 06/13/2022 INITIAL (updated)  8 Pt will score 0-1 on the lateral step down test on a 4 inch step for improved quad eccentric control.   06/13/2022   INITIAL (updated)  9 Pt will score 0-1 on the lateral step down test on a 6 inch step for improved quad eccentric control and performance of stairs.   06/13/2022 INITIAL (updated)  10 Pt will ambulate with a normalized heel to toe gait without limping without AD Baseline: Pt ambulates with swing-through gait with axillary crutches 06/13/2022 INITIAL (updated)    LONG TERM GOALS:    LTG Name Target Date Goal status  1 Pt will be able to perform his ADLs/IADLs and normal functional mobility skills without significant difficulty or pain.  Baseline: Pt reports some moderate difficulty with getting into bed and crouching at work 08/01/2022   INITIAL (updated)  2 Pt will ambulate extended community distance without an AD without increased pain or difficulty.  Baseline: Unable to ambulate without crutches 08/01/2022 INITIAL (updated)  3 Pt will be able to perform his normal daily transfers without difficulty.  Baseline: moderate difficulty 08/01/2022 INITIAL (updated)  4 Pt will demo good squatting form, symmetrical Wb'ing, and good knee alignment without significant pain for improved functional strength and tolerance with IADLs.  Baseline: Unable to perform post-op 08/01/2022 INITIAL (updated)  5 Pt will be  able to perform stairs with a reciprocal gait without the rail with good control.  Baseline: Unable to perform post-op, uses crutches 08/01/2022 INITIAL (updated)  6 Pt will be able to perform all of his school activities and work activities without significant pain or difficulty.   Baseline: unable 08/01/2022 INITIAL (updated)  7 Pt will demo 5/5 L hip and knee strength for improved tolerance with and performance of functional mobility skills and to assist in returning to PLOF.   Baseline: 08/01/2022 INITIAL (updated)     PLAN: PT FREQUENCY: 2-3x/week   PT DURATION: other: 12 weeks   PLANNED INTERVENTIONS: Therapeutic exercises, Therapeutic activity, Neuro Muscular re-education, Balance training, Gait training, Patient/Family education, Joint mobilization, Stair training, DME instructions, Aquatic Therapy, Electrical stimulation, Cryotherapy, Moist heat, Taping, and Manual therapy   PLAN FOR NEXT SESSION:  Progress early knee AROM/ strengthening in accordance with ACL post-op protocol   Vanessa [place], PT, DPT 05/02/22 4:13 PM

## 2022-05-02 NOTE — Telephone Encounter (Signed)
Spoke to patient's mom and provided her the number for [employer] customer service line to contact about iceman

## 2022-05-02 NOTE — Telephone Encounter (Signed)
Patients mom called states that his ice machine is not working and they would like to get another one. Please call them to advise on this @ [phone]

## 2022-05-03 ENCOUNTER — Ambulatory Visit: Payer: No Typology Code available for payment source

## 2022-05-04 ENCOUNTER — Ambulatory Visit: Payer: No Typology Code available for payment source

## 2022-05-04 DIAGNOSIS — M25662 Stiffness of left knee, not elsewhere classified: Secondary | ICD-10-CM

## 2022-05-04 DIAGNOSIS — M25562 Pain in left knee: Secondary | ICD-10-CM | POA: Diagnosis not present

## 2022-05-04 DIAGNOSIS — R262 Difficulty in walking, not elsewhere classified: Secondary | ICD-10-CM

## 2022-05-04 DIAGNOSIS — M6281 Muscle weakness (generalized): Secondary | ICD-10-CM

## 2022-05-04 NOTE — Therapy (Signed)
OUTPATIENT PHYSICAL THERAPY TREATMENT NOTE/ RE-EVALUATION  Patient Name: Dustin Parks MRN: [id number] DOB:[birth year], 21 y.o., male Today's Date: 05/04/2022  PCP: Haydee Salter, MD REFERRING PROVIDER: Dr Donivan Scull    PT End of Session - 05/04/22 1052     Visit Number 41    Number of Visits 65    Date for PT Re-Evaluation 08/01/22    Authorization Type MCE    Authorization Time Period 03/19/2022 - 06/19/2022    Authorization - Visit Number 7    Authorization - Number of Visits 16    PT Start Time 1050    PT Stop Time 1138   10 minutes vasopneumatic treatment   PT Time Calculation (min) 48 min    Equipment Utilized During Treatment Other (comment)   Axillary crutches and Lt knee brace   Activity Tolerance Patient tolerated treatment well    Behavior During Therapy WFL for tasks assessed/performed                        Past Medical History:  Diagnosis Date   Allergy    Asthma    out grown now    PONV (postoperative nausea and vomiting)    Past Surgical History:  Procedure Laterality Date   KNEE ARTHROSCOPY Left 02/02/2022   Procedure: LEFT ARTHROSCOPY KNEE LYSIS OF ADHESION AND MANIPULATION UNDER ANESTHESIA;  Surgeon: [name], MD;  Location: [place];  Service: Orthopedics;  Laterality: Left;   KNEE ARTHROSCOPY WITH ANTERIOR CRUCIATE LIGAMENT (ACL) REPAIR WITH HAMSTRING GRAFT Left 11/08/2021   Procedure: LEFT KNEE ANTERIOR CRUCIATE LIGAMENT RECONSTRUCTION WITH QUADRICEPS TENDON AUTOGRAFT;  Surgeon: [name], MD;  Location: [place];  Service: Orthopedics;  Laterality: Left;   KNEE ARTHROSCOPY WITH ANTERIOR CRUCIATE LIGAMENT (ACL) REPAIR WITH HAMSTRING GRAFT Left 04/30/2022   Procedure: LEFT KNEE ARTHROSCOPY WITH ANTERIOR CRUCIATE LIGAMENT (ACL) REPAIR WITH QUADRICEPS ALLOGRAFT AND LATERAL MENISCAL REPAIR;  Surgeon: [name], MD;  Location: [place];  Service: Orthopedics;  Laterality: Left;   KNEE  ARTHROSCOPY WITH MENISCAL REPAIR  11/08/2021   Procedure: LEFT LATERAL MENISCAL REPAIR;  Surgeon: [name], MD;  Location: [place];  Service: Orthopedics;;   [place] EXTRACTION  2022   Patient Active Problem List   Diagnosis Date Noted   Peripheral tear of lateral meniscus of left knee as current injury    Arthrofibrosis of knee joint, left 01/31/2022   S/P repair of anterior cruciate ligament 01/31/2022   Morbid obesity with BMI of 40.0-44.9, adult ([place]) 01/31/2022   Hypertriglyceridemia 01/31/2022   Angioedema of lips- [place] 12/13/2021   Tear of anterior cruciate ligament graft ([place])    Complex tear of lateral meniscus of left knee as current injury    History of orchitis 02/08/2021     PCP: Libby Maw, MD   REFERRING PROVIDER: [name], MD   REFERRING DIAG:  [phone] (ICD-10-CM) - Rupture of anterior cruciate ligament of left knee, initial encounter  S83.272A (ICD-10-CM) - Complex tear of lateral meniscus of left knee as current injury, initial encounter Z98.890 (ICD-10-CM) - S/P left knee arthroscopy    THERAPY DIAG:  Left knee pain, unspecified chronicity   Stiffness of left knee, not elsewhere classified   Muscle weakness (generalized)   Difficulty in walking, not elsewhere classified   ONSET DATE: Injury 10/09/2021 / Surgery 11/08/2021; subsequent Lt ACL and meniscus tear and repair on 05/02/2022    SUBJECTIVE:  SUBJECTIVE STATEMENT: Pt reports  1-2/10 Lt knee pain today. He reports minor increase in pain with short-range LAQ, although he reports this is tolerable. He also reports doing his knee AAROM exercises regularly.  PERTINENT HISTORY: 11/08/2021 Left knee ACL reconstruction with quadriceps autograft and lateral meniscal repair.    02/02/2022: Lt knee lysis of adheresion and manipulation under anesthesia  Allergy to certain adhesives including tegaderm per pt report   Re-rupture of Lt ACL and Lt meniscal tear with  subsequent repair on 05/02/2022   PAIN:  Are you having pain? No NPRS scale: 0/10 at rest, 1/10 when pain occurs Pain location: right lateral knee  Pain orientation: Right  PAIN TYPE: aching Pain description: intermittent  Aggravating factors: use of the knee / falling off the bed  Relieving factors: rest     PRECAUTIONS: Other: per Dr. Eddie Dibbles ACL reconstruction with meniscal repair   WEIGHT BEARING RESTRICTIONS Yes      OCCUPATION: in culinary school     PLOF: Independent; Pt was able to perform all of his ADLs/IADLs and functional mobility skills without difficulty or limitations.  Pt was able to perform work activities.  Pt ambulated without an AD with good stability without difficulty.    PATIENT GOALS improved stability with leaning, to have the ability to run, to be able to bowl.  Return to PLOF.      OBJECTIVE:  *Unless otherwise noted, objective measures collected previously*  LE ROM:  A/PROM Left 02/03/2022 Left 02/16/2022 Left 03/08/2022 Left 04/14/2022 Left 05/02/2022 Left 05/04/2022  Knee flexion 93 AROM/ 97 AAROM/ 103PROM/ 106 PROM following MET 96 AROM, 102 PROM, 111 following MET 106 AROM, 111 PROM, 118 following MET 117 AROM 78 AROM, 81 AAROM with strap 82 AROM, 86 AAROM with strap  Knee extension -1 AROM/ 0 PROM 2 AROM, 4 PROM 2 AROM, 4 PROM 3 AROM -3 AROM    (Blank rows = not tested)  LE MMT:  MMT Right  Left  Left 03/08/2022  Hip flexion  4+/5 5/5  Hip extension     Hip abduction     Knee flexion  5/5 5/5  Knee extension  4+/5 5/5  Ankle dorsiflexion  5/5   Ankle plantarflexion  5/5   Ankle inversion     Ankle eversion      (Blank rows = not tested)   FUNCTIONAL TESTS:  -Dead lift: Good form and depth with no pain    TODAY'S TREATMENT:  [facility] Adult PT Treatment:                                                DATE: 05/04/2022 Therapeutic Exercise: Supine Lt knee AAROM with strap 2x10 on Lt Supine quad set 2x10 with 5-sec hold on Lt Supine SLR  with hip abduction 2x10 on Lt Sidelying hip abduction 2x10 with 3-sec hold BIL Seated LAQ from 85 to 45 degrees knee flexion Manual Therapy: N/A Neuromuscular re-ed: N/A Therapeutic Activity: N/A Modalities: Game Ready vasopneumatic treatment at 34d F with Lt LE elevated in supine x10 minutes Self Care: N/A   [facility] Adult PT Treatment:                                                DATE: 05/02/2022  Therapeutic Exercise: Seated LAQ 80-40 degrees knee flexion 2x20 Supine Lt knee AAROM with strap x10 Supine quad set x10 with 5-sec hold Manual Therapy: N/A Neuromuscular re-ed: N/A Therapeutic Activity: N/A Modalities: N/A Self Care: N/A   [facility] Adult PT Treatment:                                                DATE: 04/16/2022 Therapeutic Exercise: Recumbent bike L3 x 5 min while taking subjective Modified thomas stretch with manual overpressure 3 x 30 sec SLR with 3# 3 x 10 Sidelying hip abduction with 3# 3 x 10 Trialed forward heel tap on 4" but patient unable to control Forward heel tap 2" with TRX support x 10 - patient reports lateral knee discomfort with unlocking from knee extension to flexion Standing TKE with blue 2 x 20 SL leg press (cybex) 40# 3 x 10 Manual: STM using roller to VL, ITB, and lateral gluteal regions Sidelying passive gluteal/ITB stretch      Education details: HEP Person educated: Patient Education method: Explanation, Demonstration, Tactile cues, Verbal cues Education comprehension: verbalized understanding, returned demonstration, verbal cues required, tactile cues required, and needs further education   HOME EXERCISE PROGRAM: Access Code: [email or web address] URL: [email or web address] Date: 05/02/2022 Prepared by: Vanessa [place]  Exercises - Sidelying Hip Abduction  - 3 x daily - 7 x weekly - 3 sets - 20 reps - Small Range Straight Leg Raise  - 1 x daily - 7 x weekly - 3 sets - 20 reps - Supine Knee Flexion Wall Slide  - 1 x  daily - 7 x weekly - 1 sets - 2 reps - 10 min hold - Heel slide with strap, LAYING ON BACK  - 2 x daily - 7 x weekly - 3 sets - 10 reps - 5-sec hold - Seated Long Arc Quad (90-45 Degree Range)  - 2 x daily - 7 x weekly - 3 sets - 10 reps - 5-sec hold - Supine Quad Set  - 2 x daily - 7 x weekly - 3 sets - 10 reps - 5-sec hold    ASSESSMENT: CLINICAL IMPRESSION: Pt responded well to all interventions today, demonstrating good form and no increase in pain with completed exercises today. He will continue to benefit from skilled PT to address his primary impairments and return to his prior level of function.   Objective impairments include Abnormal gait, decreased activity tolerance, decreased balance, decreased mobility, difficulty walking, decreased ROM, decreased strength, hypomobility, increased edema, impaired flexibility, and pain. These impairments are limiting patient from cleaning, community activity, driving, meal prep, occupation, laundry, shopping, school, and ambulation .    GOALS: SHORT TERM GOALS:   STG Name Target Date Goal status  1 Pt will be independent and compliant with HEP for improved pain, strength, and ROM.  Baseline: New HEP administered 05/02/2022 06/13/2022 INITIAL (updated)  2 Pt will demo a good quad set and perform supine SLRs independently with brace for improved quad strength. Baseline: Pain with quad setting/ SLR 06/13/2022 INITIAL (updated)  3 Pt will demo improved improved L knee extension PROM/AROM to 0/3 and flexion PROM to 60 deg for improved mobility and stiffness. Baseline: -3 to 78 degrees AROM 06/13/2022 INITIAL (updated)  4 Pt will demo improved improved L knee extension AROM to 0 deg and  flexion AAROM/PROM to 80/90 deg for improved  mobility and stiffness. Baseline: -3 to 78 degrees AROM 06/13/2022 INITIAL (Updated)  5 Pt will demo L knee AROM to be 0 - 120 deg for improved stiffness and performance of functional mobility.   Baseline: -3 to 78 degrees AROM   06/13/2022 INITIAL (Updated)  6 Pt will progress Wb'ing with gait per MD orders and protocol without adverse effects.  Baseline: 06/13/2022 INITIAL (updated)  7 Pt will progress with closed chain exercises per protocol without adverse effects for improved functional strength and stability.  Baseline: 06/13/2022 INITIAL (updated)  8 Pt will score 0-1 on the lateral step down test on a 4 inch step for improved quad eccentric control.   06/13/2022   INITIAL (updated)  9 Pt will score 0-1 on the lateral step down test on a 6 inch step for improved quad eccentric control and performance of stairs.   06/13/2022 INITIAL (updated)  10 Pt will ambulate with a normalized heel to toe gait without limping without AD Baseline: Pt ambulates with swing-through gait with axillary crutches 06/13/2022 INITIAL (updated)    LONG TERM GOALS:    LTG Name Target Date Goal status  1 Pt will be able to perform his ADLs/IADLs and normal functional mobility skills without significant difficulty or pain.  Baseline: Pt reports some moderate difficulty with getting into bed and crouching at work 08/01/2022   INITIAL (updated)  2 Pt will ambulate extended community distance without an AD without increased pain or difficulty.  Baseline: Unable to ambulate without crutches 08/01/2022 INITIAL (updated)  3 Pt will be able to perform his normal daily transfers without difficulty.  Baseline: moderate difficulty 08/01/2022 INITIAL (updated)  4 Pt will demo good squatting form, symmetrical Wb'ing, and good knee alignment without significant pain for improved functional strength and tolerance with IADLs.  Baseline: Unable to perform post-op 08/01/2022 INITIAL (updated)  5 Pt will be able to perform stairs with a reciprocal gait without the rail with good control.  Baseline: Unable to perform post-op, uses crutches 08/01/2022 INITIAL (updated)  6 Pt will be able to perform all of his school activities and work activities without significant pain or  difficulty.   Baseline: unable 08/01/2022 INITIAL (updated)  7 Pt will demo 5/5 L hip and knee strength for improved tolerance with and performance of functional mobility skills and to assist in returning to PLOF.   Baseline: 08/01/2022 INITIAL (updated)     PLAN: PT FREQUENCY: 2-3x/week   PT DURATION: other: 12 weeks   PLANNED INTERVENTIONS: Therapeutic exercises, Therapeutic activity, Neuro Muscular re-education, Balance training, Gait training, Patient/Family education, Joint mobilization, Stair training, DME instructions, Aquatic Therapy, Electrical stimulation, Cryotherapy, Moist heat, Taping, and Manual therapy   PLAN FOR NEXT SESSION:  Progress early knee AROM/ strengthening in accordance with ACL post-op protocol   Vanessa [place], PT, DPT 05/04/22 12:05 PM

## 2022-05-07 ENCOUNTER — Other Ambulatory Visit: Payer: Self-pay

## 2022-05-07 ENCOUNTER — Encounter: Payer: Self-pay | Admitting: Physical Therapy

## 2022-05-07 ENCOUNTER — Ambulatory Visit: Payer: No Typology Code available for payment source | Attending: Orthopaedic Surgery | Admitting: Physical Therapy

## 2022-05-07 DIAGNOSIS — M6281 Muscle weakness (generalized): Secondary | ICD-10-CM

## 2022-05-07 DIAGNOSIS — M25662 Stiffness of left knee, not elsewhere classified: Secondary | ICD-10-CM | POA: Diagnosis present

## 2022-05-07 DIAGNOSIS — M25562 Pain in left knee: Secondary | ICD-10-CM

## 2022-05-07 DIAGNOSIS — R262 Difficulty in walking, not elsewhere classified: Secondary | ICD-10-CM | POA: Diagnosis present

## 2022-05-07 NOTE — Therapy (Signed)
OUTPATIENT PHYSICAL THERAPY TREATMENT NOTE  Patient Name: Dustin Parks MRN: [id number] DOB:Aug 11, 2001, 21 y.o., male Today's Date: 05/07/2022  PCP: Haydee Salter, MD REFERRING PROVIDER: Dr Donivan Scull    PT End of Session - 05/07/22 1200     Visit Number 42    Number of Visits 30    Date for PT Re-Evaluation 08/01/22    Authorization Type [facility] [employer]    Authorization Time Period 03/19/2022 - 06/19/2022    Authorization - Visit Number 8    Authorization - Number of Visits 16    PT Start Time 1140    PT Stop Time 1215    PT Time Calculation (min) 35 min    Activity Tolerance Patient tolerated treatment well    Behavior During Therapy WFL for tasks assessed/performed                         Past Medical History:  Diagnosis Date   Allergy    Asthma    out grown now    PONV (postoperative nausea and vomiting)    Past Surgical History:  Procedure Laterality Date   KNEE ARTHROSCOPY Left 02/02/2022   Procedure: LEFT ARTHROSCOPY KNEE LYSIS OF ADHESION AND MANIPULATION UNDER ANESTHESIA;  Surgeon: [name], MD;  Location: [place];  Service: Orthopedics;  Laterality: Left;   KNEE ARTHROSCOPY WITH ANTERIOR CRUCIATE LIGAMENT (ACL) REPAIR WITH HAMSTRING GRAFT Left 11/08/2021   Procedure: LEFT KNEE ANTERIOR CRUCIATE LIGAMENT RECONSTRUCTION WITH QUADRICEPS TENDON AUTOGRAFT;  Surgeon: [name], MD;  Location: [place];  Service: Orthopedics;  Laterality: Left;   KNEE ARTHROSCOPY WITH ANTERIOR CRUCIATE LIGAMENT (ACL) REPAIR WITH HAMSTRING GRAFT Left 04/30/2022   Procedure: LEFT KNEE ARTHROSCOPY WITH ANTERIOR CRUCIATE LIGAMENT (ACL) REPAIR WITH QUADRICEPS ALLOGRAFT AND LATERAL MENISCAL REPAIR;  Surgeon: [name], MD;  Location: [place];  Service: Orthopedics;  Laterality: Left;   KNEE ARTHROSCOPY WITH MENISCAL REPAIR  11/08/2021   Procedure: LEFT LATERAL MENISCAL REPAIR;  Surgeon: [name], MD;  Location: [place];  Service: Orthopedics;;   [place] EXTRACTION  2022   Patient Active Problem List   Diagnosis Date Noted   Peripheral tear of lateral meniscus of left knee as current injury    Arthrofibrosis of knee joint, left 01/31/2022   S/P repair of anterior cruciate ligament 01/31/2022   Morbid obesity with BMI of 40.0-44.9, adult ([place]) 01/31/2022   Hypertriglyceridemia 01/31/2022   Angioedema of lips- [place] 12/13/2021   Tear of anterior cruciate ligament graft ([place])    Complex tear of lateral meniscus of left knee as current injury    History of orchitis 02/08/2021     PCP: Libby Maw, MD   REFERRING PROVIDER: [name], MD   REFERRING DIAG:  [phone] (ICD-10-CM) - Rupture of anterior cruciate ligament of left knee, initial encounter  S83.272A (ICD-10-CM) - Complex tear of lateral meniscus of left knee as current injury, initial encounter Z98.890 (ICD-10-CM) - S/P left knee arthroscopy    THERAPY DIAG:  Left knee pain, unspecified chronicity   Stiffness of left knee, not elsewhere classified   Muscle weakness (generalized)   Difficulty in walking, not elsewhere classified    PERTINENT HISTORY: 11/08/2021 Left knee ACL reconstruction with quadriceps autograft and lateral meniscal repair.    02/02/2022: Lt knee lysis of adheresion and manipulation under anesthesia  Allergy to certain adhesives including tegaderm per pt report   Re-rupture of Lt ACL and Lt  meniscal tear with subsequent repair on 05/02/2022   ONSET DATE: Injury 10/09/2021 / Surgery 11/08/2021; subsequent Lt ACL and meniscus tear and repair on 05/02/2022     SUBJECTIVE: Patient reporting no pain at rest, he does note a little more pain with activity since he stopped taking pain meds. He does report occasional popping with his knee that is non-painful.  PAIN:  Are you having pain? No NPRS scale: 0/10 at rest, 1-2/10 when pain occurs Pain location: Left knee  Pain orientation:  Left PAIN TYPE: Aching Pain description: intermittent  Aggravating factors: use of the kne Relieving factors: rest     PRECAUTIONS: Other: per Dr. Eddie Dibbles ACL reconstruction with meniscal repair   WEIGHT BEARING RESTRICTIONS Yes   PLOF: Independent; Pt was able to perform all of his ADLs/IADLs and functional mobility skills without difficulty or limitations.  Pt was able to perform work activities.  Pt ambulated without an AD with good stability without difficulty.    PATIENT GOALS improved stability with leaning, to have the ability to run, to be able to bowl.  Return to PLOF.      OBJECTIVE:  *Unless otherwise noted, objective measures collected previously*  LE ROM:  A/PROM Left 02/03/2022 Left 05/02/2022 Left 05/04/2022 Left 05/07/2022  Knee flexion 93 AROM/ 97 AAROM/ 103PROM/ 106 PROM following MET 78 AROM, 81 AAROM with strap 82 AROM, 86 AAROM with strap 90 deg AROM heel slide  Knee extension -1 AROM/ 0 PROM -3 AROM  4 hyper   (Blank rows = not tested)  LE MMT:  MMT Right  Left  Left 03/08/2022  Hip flexion  4+/5 5/5  Hip extension     Hip abduction     Knee flexion  5/5 5/5  Knee extension  4+/5 5/5  Ankle dorsiflexion  5/5   Ankle plantarflexion  5/5   Ankle inversion     Ankle eversion      (Blank rows = not tested)   FUNCTIONAL TESTS:  -Dead lift: Good form and depth with no pain    TODAY'S TREATMENT: [facility] Adult PT Treatment:                                                DATE: 05/07/2022 Therapeutic Exercise: Quad set 10 x 5 sec Quad set with heel prop 10 x 5 sec SLR 3 x 5 - cued to flex foot and perform quad set prior to lift Supine active heel slide 10 x 5 sec Modalities: E-stim attended: [place] x 10 minutes with active quad set and heel prop 10:50 sec on:off, 2 sec ramp   [facility] Adult PT Treatment:                                                DATE: 05/04/2022 Therapeutic Exercise: Supine Lt knee AAROM with strap 2x10 on Lt Supine quad set 2x10  with 5-sec hold on Lt Supine SLR with hip abduction 2x10 on Lt Sidelying hip abduction 2x10 with 3-sec hold BIL Seated LAQ from 85 to 45 degrees knee flexion Modalities: Game Ready vasopneumatic treatment at [place] with Lt LE elevated in supine x10 minutes   [facility] Adult PT Treatment:  DATE: 05/02/2022 Therapeutic Exercise: Seated LAQ 80-40 degrees knee flexion 2x20 Supine Lt knee AAROM with strap x10 Supine quad set x10 with 5-sec hold    PATIENT EDUCATION: Education details: HEP, continued focus on quad activation and performing SLR without lag, performing heel slide without strap to avoid going beyond 90 deg, work on knee hyperextension using heel prop and quad set Person educated: Patient Education method: Consulting civil engineer, Demonstration, Tactile cues, Verbal cues Education comprehension: verbalized understanding, returned demonstration, verbal cues required, tactile cues required, and needs further education   HOME EXERCISE PROGRAM: Access Code: [email or web address]    ASSESSMENT: CLINICAL IMPRESSION: Patient tolerated therapy well with no adverse effects. This visit we removed his ACE wrap to inspect bandages and allow for visualization of knee with e-stim. Re-wrapped the knee using ACE wrap post session. Patient continues to demonstrate slight extension lag with SLR so incorporated r[place] e-stim to improve quad activation and control. Patient does exhibit improved knee ROM, achieving goal of 90 deg and exhibiting ability to perform slight hyperextension. He was instructed to avoid using strap for heel slide because he is likely stretching past 90 deg when using strap. Will continue to reassess to ensure he does not lose knee motion. Patient did seem to exhibit improved quad activation post session but still with slight extension lag so instructed to continue using brace for SLR at home. Patient would benefit from continued skilled PT to progress per  protocol in order to return to prior level of function.    GOALS: SHORT TERM GOALS:   STG Name Target Date Goal status  1 Pt will be independent and compliant with HEP for improved pain, strength, and ROM.  Baseline: New HEP administered 05/02/2022 06/13/2022 INITIAL (updated)  2 Pt will demo a good quad set and perform supine SLRs independently with brace for improved quad strength. Baseline: Pain with quad setting/ SLR 06/13/2022 INITIAL (updated)  3 Pt will demo improved improved L knee extension PROM/AROM to 0/3 and flexion PROM to 60 deg for improved mobility and stiffness. Baseline: -3 to 78 degrees AROM 06/13/2022 INITIAL (updated)  4 Pt will demo improved improved L knee extension AROM to 0 deg and  flexion AAROM/PROM to 80/90 deg for improved mobility and stiffness. Baseline: -3 to 78 degrees AROM 06/13/2022 INITIAL (Updated)  5 Pt will demo L knee AROM to be 0 - 120 deg for improved stiffness and performance of functional mobility.   Baseline: -3 to 78 degrees AROM  06/13/2022 INITIAL (Updated)  6 Pt will progress Wb'ing with gait per MD orders and protocol without adverse effects.  Baseline: 06/13/2022 INITIAL (updated)  7 Pt will progress with closed chain exercises per protocol without adverse effects for improved functional strength and stability.  Baseline: 06/13/2022 INITIAL (updated)  8 Pt will score 0-1 on the lateral step down test on a 4 inch step for improved quad eccentric control.   06/13/2022   INITIAL (updated)  9 Pt will score 0-1 on the lateral step down test on a 6 inch step for improved quad eccentric control and performance of stairs.   06/13/2022 INITIAL (updated)  10 Pt will ambulate with a normalized heel to toe gait without limping without AD Baseline: Pt ambulates with swing-through gait with axillary crutches 06/13/2022 INITIAL (updated)    LONG TERM GOALS:    LTG Name Target Date Goal status  1 Pt will be able to perform his ADLs/IADLs and normal functional mobility  skills without significant difficulty or pain.  Baseline: Pt  reports some moderate difficulty with getting into bed and crouching at work 08/01/2022   INITIAL (updated)  2 Pt will ambulate extended community distance without an AD without increased pain or difficulty.  Baseline: Unable to ambulate without crutches 08/01/2022 INITIAL (updated)  3 Pt will be able to perform his normal daily transfers without difficulty.  Baseline: moderate difficulty 08/01/2022 INITIAL (updated)  4 Pt will demo good squatting form, symmetrical Wb'ing, and good knee alignment without significant pain for improved functional strength and tolerance with IADLs.  Baseline: Unable to perform post-op 08/01/2022 INITIAL (updated)  5 Pt will be able to perform stairs with a reciprocal gait without the rail with good control.  Baseline: Unable to perform post-op, uses crutches 08/01/2022 INITIAL (updated)  6 Pt will be able to perform all of his school activities and work activities without significant pain or difficulty.   Baseline: unable 08/01/2022 INITIAL (updated)  7 Pt will demo 5/5 L hip and knee strength for improved tolerance with and performance of functional mobility skills and to assist in returning to PLOF.   Baseline: 08/01/2022 INITIAL (updated)     PLAN: PT FREQUENCY: 2-3x/week   PT DURATION: other: 12 weeks   PLANNED INTERVENTIONS: Therapeutic exercises, Therapeutic activity, Neuro Muscular re-education, Balance training, Gait training, Patient/Family education, Joint mobilization, Stair training, DME instructions, Aquatic Therapy, Electrical stimulation, Cryotherapy, Moist heat, Taping, and Manual therapy   PLAN FOR NEXT SESSION:  Progress early knee AROM/ strengthening in accordance with ACL post-op protocol   Hilda Blades, PT, DPT, LAT, ATC 05/07/22  12:40 PM Phone: [phone] Fax: [phone]

## 2022-05-10 ENCOUNTER — Ambulatory Visit: Payer: No Typology Code available for payment source

## 2022-05-10 DIAGNOSIS — R262 Difficulty in walking, not elsewhere classified: Secondary | ICD-10-CM

## 2022-05-10 DIAGNOSIS — M6281 Muscle weakness (generalized): Secondary | ICD-10-CM

## 2022-05-10 DIAGNOSIS — M25662 Stiffness of left knee, not elsewhere classified: Secondary | ICD-10-CM

## 2022-05-10 DIAGNOSIS — M25562 Pain in left knee: Secondary | ICD-10-CM

## 2022-05-10 NOTE — Therapy (Signed)
OUTPATIENT PHYSICAL THERAPY TREATMENT NOTE  Patient Name: Dustin Parks MRN: [id number] DOB:02/22/2001, 21 y.o., male Today's Date: 05/10/2022  PCP: Haydee Salter, MD REFERRING PROVIDER: Dr Donivan Scull    PT End of Session - 05/10/22 1819     Visit Number 43    Number of Visits 43    Date for PT Re-Evaluation 08/01/22    Authorization Type [facility]    Authorization Time Period 03/19/2022 - 06/19/2022    Authorization - Visit Number 9    Authorization - Number of Visits 16    PT Start Time 1950    PT Stop Time 1903    PT Time Calculation (min) 40 min    Equipment Utilized During Treatment Other (comment)   Axillary crutches on knee brace   Activity Tolerance Patient tolerated treatment well    Behavior During Therapy WFL for tasks assessed/performed                          Past Medical History:  Diagnosis Date   Allergy    Asthma    out grown now    PONV (postoperative nausea and vomiting)    Past Surgical History:  Procedure Laterality Date   KNEE ARTHROSCOPY Left 02/02/2022   Procedure: LEFT ARTHROSCOPY KNEE LYSIS OF ADHESION AND MANIPULATION UNDER ANESTHESIA;  Surgeon: [name], MD;  Location: [place];  Service: Orthopedics;  Laterality: Left;   KNEE ARTHROSCOPY WITH ANTERIOR CRUCIATE LIGAMENT (ACL) REPAIR WITH HAMSTRING GRAFT Left 11/08/2021   Procedure: LEFT KNEE ANTERIOR CRUCIATE LIGAMENT RECONSTRUCTION WITH QUADRICEPS TENDON AUTOGRAFT;  Surgeon: [name], MD;  Location: [place];  Service: Orthopedics;  Laterality: Left;   KNEE ARTHROSCOPY WITH ANTERIOR CRUCIATE LIGAMENT (ACL) REPAIR WITH HAMSTRING GRAFT Left 04/30/2022   Procedure: LEFT KNEE ARTHROSCOPY WITH ANTERIOR CRUCIATE LIGAMENT (ACL) REPAIR WITH QUADRICEPS ALLOGRAFT AND LATERAL MENISCAL REPAIR;  Surgeon: [name], MD;  Location: [place];  Service: Orthopedics;  Laterality: Left;   KNEE ARTHROSCOPY WITH MENISCAL REPAIR  11/08/2021    Procedure: LEFT LATERAL MENISCAL REPAIR;  Surgeon: [name], MD;  Location: [place];  Service: Orthopedics;;   [place] EXTRACTION  2022   Patient Active Problem List   Diagnosis Date Noted   Peripheral tear of lateral meniscus of left knee as current injury    Arthrofibrosis of knee joint, left 01/31/2022   S/P repair of anterior cruciate ligament 01/31/2022   Morbid obesity with BMI of 40.0-44.9, adult ([place]) 01/31/2022   Hypertriglyceridemia 01/31/2022   Angioedema of lips- [place] 12/13/2021   Tear of anterior cruciate ligament graft ([place])    Complex tear of lateral meniscus of left knee as current injury    History of orchitis 02/08/2021     PCP: Libby Maw, MD   REFERRING PROVIDER: [name], MD   REFERRING DIAG:  [id number] (ICD-10-CM) - Rupture of anterior cruciate ligament of left knee, initial encounter  S83.272A (ICD-10-CM) - Complex tear of lateral meniscus of left knee as current injury, initial encounter Z98.890 (ICD-10-CM) - S/P left knee arthroscopy    THERAPY DIAG:  Left knee pain, unspecified chronicity   Stiffness of left knee, not elsewhere classified   Muscle weakness (generalized)   Difficulty in walking, not elsewhere classified    PERTINENT HISTORY: 11/08/2021 Left knee ACL reconstruction with quadriceps autograft and lateral meniscal repair.    02/02/2022: Lt knee lysis of adheresion and manipulation under anesthesia  Allergy  to certain adhesives including tegaderm per pt report   Re-rupture of Lt ACL and Lt meniscal tear with subsequent repair on 05/02/2022   ONSET DATE: Injury 10/09/2021 / Surgery 11/08/2021; subsequent Lt ACL and meniscus tear and repair on 05/02/2022     SUBJECTIVE: Pt reports no pain currently without any pain medication the past week. He reports daily adherence to his HEP. He reports exercises are going well.   PAIN:  Are you having pain? No NPRS scale: 0/10 at rest, 1-2/10 when pain  occurs Pain location: Left knee  Pain orientation: Left PAIN TYPE: Aching Pain description: intermittent  Aggravating factors: use of the kne Relieving factors: rest     PRECAUTIONS: Other: per Dr. Eddie Dibbles ACL reconstruction with meniscal repair   WEIGHT BEARING RESTRICTIONS Yes   PLOF: Independent; Pt was able to perform all of his ADLs/IADLs and functional mobility skills without difficulty or limitations.  Pt was able to perform work activities.  Pt ambulated without an AD with good stability without difficulty.    PATIENT GOALS improved stability with leaning, to have the ability to run, to be able to bowl.  Return to PLOF.      OBJECTIVE:  *Unless otherwise noted, objective measures collected previously*  LE ROM:  A/PROM Left 02/03/2022 Left 05/02/2022 Left 05/04/2022 Left 05/07/2022  Knee flexion 93 AROM/ 97 AAROM/ 103PROM/ 106 PROM following MET 78 AROM, 81 AAROM with strap 82 AROM, 86 AAROM with strap 90 deg AROM heel slide  Knee extension -1 AROM/ 0 PROM -3 AROM  4 hyper   (Blank rows = not tested)  LE MMT:  MMT Right  Left  Left 03/08/2022  Hip flexion  4+/5 5/5  Hip extension     Hip abduction     Knee flexion  5/5 5/5  Knee extension  4+/5 5/5  Ankle dorsiflexion  5/5   Ankle plantarflexion  5/5   Ankle inversion     Ankle eversion      (Blank rows = not tested)   FUNCTIONAL TESTS:  -Dead lift: Good form and depth with no pain    TODAY'S TREATMENT:  [facility] Adult PT Treatment:                                                DATE: 05/10/2022 Therapeutic Exercise: Supine Lt heel slides 2x10 Supine Lt SLR with hip abduction  Right side knee plank with Lt hip abduction 3x10 Seated LAQ from 90-45 degrees knee flexion 3x10 with 3-sec hold on Lt Squat to 90 degrees knee flexion with UE support 3x8, weight-bearing through Rt LE Seated active hamstring stretch x58mn on Lt Manual Therapy: N/A Neuromuscular re-ed: N/A Therapeutic  Activity: N/A Modalities: N/A Self Care: N/A   [facility] Adult PT Treatment:                                                DATE: 05/07/2022 Therapeutic Exercise: Quad set 10 x 5 sec Quad set with heel prop 10 x 5 sec SLR 3 x 5 - cued to flex foot and perform quad set prior to lift Supine active heel slide 10 x 5 sec Modalities: E-stim attended: [place] 10 minutes with active quad set and heel  prop 10:50 sec on:off, 2 sec ramp   [facility] Adult PT Treatment:                                                DATE: 05/04/2022 Therapeutic Exercise: Supine Lt knee AAROM with strap 2x10 on Lt Supine quad set 2x10 with 5-sec hold on Lt Supine SLR with hip abduction 2x10 on Lt Sidelying hip abduction 2x10 with 3-sec hold BIL Seated LAQ from 85 to 45 degrees knee flexion Modalities: Game Ready vasopneumatic treatment at [place] with Lt LE elevated in supine x10 minutes      PATIENT EDUCATION: Education details: HEP, continued focus on quad activation and performing SLR without lag, performing heel slide without strap to avoid going beyond 90 deg, work on knee hyperextension using heel prop and quad set Person educated: Patient Education method: Consulting civil engineer, Demonstration, Tactile cues, Verbal cues Education comprehension: verbalized understanding, returned demonstration, verbal cues required, tactile cues required, and needs further education   HOME EXERCISE PROGRAM: Access Code: [email or web address]    ASSESSMENT: CLINICAL IMPRESSION: Pt responded excellently to exercises today. He continues to maintain knee flexion ROM to 90 degrees, pending further advancement until after his ortho follow-up on Monday. He also tolerated closed-chain exercise without weight-bearing through the Lt LE. He will continue to benefit from skilled PT to address his primary impairments and return to his PLOF with less limitation.    GOALS: SHORT TERM GOALS:   STG Name Target Date Goal status  1 Pt will be independent and  compliant with HEP for improved pain, strength, and ROM.  Baseline: New HEP administered 05/02/2022 05/10/2022: Achieved 06/13/2022 ACHIEVED  2 Pt will demo a good quad set and perform supine SLRs independently with brace for improved quad strength. Baseline: Pain with quad setting/ SLR 05/10/2022: achieved 06/13/2022 ACHIEVED  3 Pt will demo improved improved L knee extension PROM/AROM to 0/3 and flexion PROM to 60 deg for improved mobility and stiffness. Baseline: -3 to 78 degrees AROM 06/13/2022 INITIAL (updated)  4 Pt will demo improved improved L knee extension AROM to 0 deg and  flexion AAROM/PROM to 80/90 deg for improved mobility and stiffness. Baseline: -3 to 78 degrees AROM 05/10/2022: 0-90 degrees 06/13/2022 ACHIEVED  5 Pt will demo L knee AROM to be 0 - 120 deg for improved stiffness and performance of functional mobility.   Baseline: -3 to 78 degrees AROM  06/13/2022 INITIAL (Updated)  6 Pt will progress Wb'ing with gait per MD orders and protocol without adverse effects.  Baseline: 06/13/2022 INITIAL (updated)  7 Pt will progress with closed chain exercises per protocol without adverse effects for improved functional strength and stability.  Baseline: 06/13/2022 INITIAL (updated)  8 Pt will score 0-1 on the lateral step down test on a 4 inch step for improved quad eccentric control.   06/13/2022   INITIAL (updated)  9 Pt will score 0-1 on the lateral step down test on a 6 inch step for improved quad eccentric control and performance of stairs.   06/13/2022 INITIAL (updated)  10 Pt will ambulate with a normalized heel to toe gait without limping without AD Baseline: Pt ambulates with swing-through gait with axillary crutches 06/13/2022 INITIAL (updated)    LONG TERM GOALS:    LTG Name Target Date Goal status  1 Pt will be able to perform his ADLs/IADLs and normal  functional mobility skills without significant difficulty or pain.  Baseline: Pt reports some moderate difficulty with getting into bed and  crouching at work 08/01/2022   INITIAL (updated)  2 Pt will ambulate extended community distance without an AD without increased pain or difficulty.  Baseline: Unable to ambulate without crutches 08/01/2022 INITIAL (updated)  3 Pt will be able to perform his normal daily transfers without difficulty.  Baseline: moderate difficulty 08/01/2022 INITIAL (updated)  4 Pt will demo good squatting form, symmetrical Wb'ing, and good knee alignment without significant pain for improved functional strength and tolerance with IADLs.  Baseline: Unable to perform post-op 08/01/2022 INITIAL (updated)  5 Pt will be able to perform stairs with a reciprocal gait without the rail with good control.  Baseline: Unable to perform post-op, uses crutches 08/01/2022 INITIAL (updated)  6 Pt will be able to perform all of his school activities and work activities without significant pain or difficulty.   Baseline: unable 08/01/2022 INITIAL (updated)  7 Pt will demo 5/5 L hip and knee strength for improved tolerance with and performance of functional mobility skills and to assist in returning to PLOF.   Baseline: 08/01/2022 INITIAL (updated)     PLAN: PT FREQUENCY: 2-3x/week   PT DURATION: other: 12 weeks   PLANNED INTERVENTIONS: Therapeutic exercises, Therapeutic activity, Neuro Muscular re-education, Balance training, Gait training, Patient/Family education, Joint mobilization, Stair training, DME instructions, Aquatic Therapy, Electrical stimulation, Cryotherapy, Moist heat, Taping, and Manual therapy   PLAN FOR NEXT SESSION:  Progress early knee AROM/ strengthening in accordance with ACL post-op protocol   Vanessa [place], PT, DPT 05/10/22 7:06 PM

## 2022-05-11 ENCOUNTER — Encounter: Payer: Self-pay | Admitting: Family Medicine

## 2022-05-11 ENCOUNTER — Telehealth (INDEPENDENT_AMBULATORY_CARE_PROVIDER_SITE_OTHER): Payer: No Typology Code available for payment source | Admitting: Family Medicine

## 2022-05-11 VITALS — Ht 73.0 in | Wt 300.0 lb

## 2022-05-11 DIAGNOSIS — F4321 Adjustment disorder with depressed mood: Secondary | ICD-10-CM

## 2022-05-11 NOTE — Progress Notes (Signed)
[facility] [address] [place] [address] Dept: [phone] Dept Fax: [phone]  Virtual Video Visit  I connected with Dustin Parks on 05/11/22 at 10:00 AM EDT by a video enabled telemedicine application and verified that I am speaking with the correct person using two identifiers.  Location patient: Home (car in driveway) Location provider: Clinic Persons participating in the virtual visit: Patient, Provider  I discussed the limitations of evaluation and management by telemedicine and the availability of in person appointments. The patient expressed understanding and agreed to proceed.  Chief Complaint  Patient presents with   Acute Visit    Issues with mental health and would like referral     SUBJECTIVE:  HPI: Dustin Parks is a 21 y.o. male who presents with concerns for depressed mood. He notes that he is currently going through a separation from his SO (not married). They have an 57 month old child together, which is complicating matters. He notes this is causing him to feel depressed and at times to feel empty or to zone out. He is getting 8 hours sleep, but does not find this refreshing. He notes he never has time for leisure activities because of the demands of his partner. He is seeking legal advice related to the separation. He is interested in counseling to provide him an avenue to talk about his feelings and thoughts and to make sure   Patient Active Problem List   Diagnosis Date Noted   Peripheral tear of lateral meniscus of left knee as current injury    Arthrofibrosis of knee joint, left 01/31/2022   S/P repair of anterior cruciate ligament 01/31/2022   Morbid obesity with BMI of 40.0-44.9, adult ([facility]) 01/31/2022   Hypertriglyceridemia 01/31/2022   Angioedema of lips- [place] 12/13/2021   Tear of anterior cruciate ligament graft ([facility])    Complex tear of lateral meniscus of left knee as  current injury    History of orchitis 02/08/2021    Past Surgical History:  Procedure Laterality Date   KNEE ARTHROSCOPY Left 02/02/2022   Procedure: LEFT ARTHROSCOPY KNEE LYSIS OF ADHESION AND MANIPULATION UNDER ANESTHESIA;  Surgeon: Huel Cote, MD;  Location: [facility];  Service: Orthopedics;  Laterality: Left;   KNEE ARTHROSCOPY WITH ANTERIOR CRUCIATE LIGAMENT (ACL) REPAIR WITH HAMSTRING GRAFT Left 11/08/2021   Procedure: LEFT KNEE ANTERIOR CRUCIATE LIGAMENT RECONSTRUCTION WITH QUADRICEPS TENDON AUTOGRAFT;  Surgeon: Huel Cote, MD;  Location: [facility];  Service: Orthopedics;  Laterality: Left;   KNEE ARTHROSCOPY WITH ANTERIOR CRUCIATE LIGAMENT (ACL) REPAIR WITH HAMSTRING GRAFT Left 04/30/2022   Procedure: LEFT KNEE ARTHROSCOPY WITH ANTERIOR CRUCIATE LIGAMENT (ACL) REPAIR WITH QUADRICEPS ALLOGRAFT AND LATERAL MENISCAL REPAIR;  Surgeon: Huel Cote, MD;  [facility];  Service: Orthopedics;  Laterality: Left;   KNEE ARTHROSCOPY WITH MENISCAL REPAIR  11/08/2021   Procedure: LEFT LATERAL MENISCAL REPAIR;  Surgeon: Huel Cote, MD;  Location: [facility];  Service: Orthopedics;;   WISDOM TOOTH EXTRACTION  2022   Family History  Problem Relation Age of Onset   Obesity Mother    Hyperlipidemia Father    Obesity Father    Obesity Maternal Grandmother    Stroke Maternal Grandfather    Diabetes Paternal Grandmother    Social History   Tobacco Use   Smoking status: Never   Smokeless tobacco: Never  Vaping Use   Vaping Use: Never used  Substance Use Topics   Alcohol use: Not Currently  Comment: rare   Drug use: Yes    Types: Marijuana    Comment: Delta 8 THC, 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    Current Outpatient Medications:    acetaminophen (TYLENOL) 500 MG tablet, Take 500 mg by mouth every 6 (six) hours as needed., Disp: , Rfl:    aspirin EC 325 MG tablet, Take 1 tablet (325 mg total) by mouth daily., Disp: 30 tablet,  Rfl: 0   EPINEPHrine 0.3 mg/0.3 mL IJ SOAJ injection, Inject 0.3 mg into the muscle as needed for anaphylaxis., Disp: 2 each, Rfl: 1   ibuprofen (ADVIL) 800 MG tablet, Take 800 mg by mouth every 8 (eight) hours as needed., Disp: , Rfl:   Allergies  Allergen Reactions   Other Swelling    All citrus fruit with the exception of lime   ROS: See pertinent positives and negatives per HPI.  OBSERVATIONS/OBJECTIVE:  VITALS per patient if applicable: Today's Vitals   05/11/22 1009  Weight: 300 lb (136.1 kg)  Height: 6\' 1"  (1.854 m)   Body mass index is 39.58 kg/m.   GENERAL: Alert and oriented. Appears well and in no acute distress.  PSYCH/NEURO: Pleasant and cooperative. No obvious depression or anxiety. Speech and thought processing grossly intact.  ASSESSMENT AND PLAN:  1. Adjustment disorder with depressed mood Mr. Dredge is having some depressive struggles with his current domestic situation. I agree with his request to start counseling as a way of dealing with this issue.  - Ambulatory referral to Psychology   I discussed the assessment and treatment plan with the patient. The patient was provided an opportunity to ask questions and all were answered. The patient agreed with the plan and demonstrated an understanding of the instructions.   The patient was advised to call back or seek an in-person evaluation if the symptoms worsen or if the condition fails to improve as anticipated.  Return if symptoms worsen or fail to improve.   Philbert Riser, MD

## 2022-05-14 ENCOUNTER — Encounter: Payer: Self-pay | Admitting: Physical Therapy

## 2022-05-14 ENCOUNTER — Ambulatory Visit (INDEPENDENT_AMBULATORY_CARE_PROVIDER_SITE_OTHER): Payer: No Typology Code available for payment source | Admitting: Orthopaedic Surgery

## 2022-05-14 ENCOUNTER — Other Ambulatory Visit: Payer: Self-pay

## 2022-05-14 ENCOUNTER — Ambulatory Visit: Payer: No Typology Code available for payment source | Admitting: Physical Therapy

## 2022-05-14 DIAGNOSIS — M25562 Pain in left knee: Secondary | ICD-10-CM

## 2022-05-14 DIAGNOSIS — M25662 Stiffness of left knee, not elsewhere classified: Secondary | ICD-10-CM

## 2022-05-14 DIAGNOSIS — T8489XA Other specified complication of internal orthopedic prosthetic devices, implants and grafts, initial encounter: Secondary | ICD-10-CM

## 2022-05-14 DIAGNOSIS — M6281 Muscle weakness (generalized): Secondary | ICD-10-CM

## 2022-05-14 DIAGNOSIS — R262 Difficulty in walking, not elsewhere classified: Secondary | ICD-10-CM

## 2022-05-14 NOTE — Therapy (Signed)
OUTPATIENT PHYSICAL THERAPY TREATMENT NOTE  Patient Name: Dustin Parks MRN: [id number] DOB:Jul 18, 2001, 21 y.o., male Today's Date: 05/14/2022  PCP: Haydee Salter, MD REFERRING PROVIDER: Dr Donivan Scull    PT End of Session - 05/14/22 1711     Visit Number 28    Number of Visits 23    Date for PT Re-Evaluation 08/01/22    Authorization Type [facility]    Authorization Time Period 03/19/2022 - 06/19/2022    Authorization - Visit Number 10    Authorization - Number of Visits 16    PT Start Time 1700    PT Stop Time 1745    PT Time Calculation (min) 41 min    Activity Tolerance Patient tolerated treatment well    Behavior During Therapy [facility] for tasks assessed/performed                           Past Medical History:  Diagnosis Date   Allergy    Asthma    out grown now    PONV (postoperative nausea and vomiting)    Past Surgical History:  Procedure Laterality Date   KNEE ARTHROSCOPY Left 02/02/2022   Procedure: LEFT ARTHROSCOPY KNEE LYSIS OF ADHESION AND MANIPULATION UNDER ANESTHESIA;  Surgeon: [name], MD;  Location: [place];  Service: Orthopedics;  Laterality: Left;   KNEE ARTHROSCOPY WITH ANTERIOR CRUCIATE LIGAMENT (ACL) REPAIR WITH HAMSTRING GRAFT Left 11/08/2021   Procedure: LEFT KNEE ANTERIOR CRUCIATE LIGAMENT RECONSTRUCTION WITH QUADRICEPS TENDON AUTOGRAFT;  Surgeon: [name], MD;  Location: [place];  Service: Orthopedics;  Laterality: Left;   KNEE ARTHROSCOPY WITH ANTERIOR CRUCIATE LIGAMENT (ACL) REPAIR WITH HAMSTRING GRAFT Left 04/30/2022   Procedure: LEFT KNEE ARTHROSCOPY WITH ANTERIOR CRUCIATE LIGAMENT (ACL) REPAIR WITH QUADRICEPS ALLOGRAFT AND LATERAL MENISCAL REPAIR;  Surgeon: [name], MD;  Location: [place];  Service: Orthopedics;  Laterality: Left;   KNEE ARTHROSCOPY WITH MENISCAL REPAIR  11/08/2021   Procedure: LEFT LATERAL MENISCAL REPAIR;  Surgeon: [name], MD;  Location: [place];  Service: Orthopedics;;   [place] EXTRACTION  2022   Patient Active Problem List   Diagnosis Date Noted   Peripheral tear of lateral meniscus of left knee as current injury    Arthrofibrosis of knee joint, left 01/31/2022   S/P repair of anterior cruciate ligament 01/31/2022   Morbid obesity with BMI of 40.0-44.9, adult ([place] Rizzolo) 01/31/2022   Hypertriglyceridemia 01/31/2022   Angioedema of lips- [place] 12/13/2021   Tear of anterior cruciate ligament graft ([place])    Complex tear of lateral meniscus of left knee as current injury    History of orchitis 02/08/2021     PCP: Libby Maw, MD   REFERRING PROVIDER: [name], MD   REFERRING DIAG:  [phone] (ICD-10-CM) - Rupture of anterior cruciate ligament of left knee, initial encounter  S83.272A (ICD-10-CM) - Complex tear of lateral meniscus of left knee as current injury, initial encounter Z98.890 (ICD-10-CM) - S/P left knee arthroscopy    THERAPY DIAG:  Left knee pain, unspecified chronicity   Stiffness of left knee, not elsewhere classified   Muscle weakness (generalized)   Difficulty in walking, not elsewhere classified    PERTINENT HISTORY: 11/08/2021 Left knee ACL reconstruction with quadriceps autograft and lateral meniscal repair.    02/02/2022: Lt knee lysis of adheresion and manipulation under anesthesia  Allergy to certain adhesives including tegaderm per pt report   Re-rupture of Lt ACL  and Lt meniscal tear with subsequent repair on 05/02/2022   ONSET DATE: Injury 10/09/2021 / Surgery 11/08/2021; subsequent Lt ACL and meniscus tear and repair on 05/02/2022     SUBJECTIVE: Pt reports he is doing well. He just followed up with surgeon who unlocked brace to 120 and is now WBAT with brace unlocked. He feels the brace is not fitting properly because he was given a new brace.  PAIN:  Are you having pain? No NPRS scale: 0/10 at rest, 1-2/10 when pain occurs Pain location: Left knee   Pain orientation: Left PAIN TYPE: Aching Pain description: intermittent  Aggravating factors: use of the kne Relieving factors: rest     PRECAUTIONS: Other: per Dr. Eddie Dibbles ACL reconstruction with meniscal repair   WEIGHT BEARING RESTRICTIONS Yes   PLOF: Independent; Pt was able to perform all of his ADLs/IADLs and functional mobility skills without difficulty or limitations.  Pt was able to perform work activities.  Pt ambulated without an AD with good stability without difficulty.    PATIENT GOALS improved stability with leaning, to have the ability to run, to be able to bowl.  Return to PLOF.      OBJECTIVE:  *Unless otherwise noted, objective measures collected previously*  LE ROM:  A/PROM Left 02/03/2022 Left 05/02/2022 Left 05/04/2022 Left 05/07/2022 Left 05/14/2022  Knee flexion 93 AROM/ 97 AAROM/ 103PROM/ 106 PROM following MET 78 AROM, 81 AAROM with strap 82 AROM, 86 AAROM with strap 90 deg AROM heel slide 100 deg  Knee extension -1 AROM/ 0 PROM -3 AROM  4 hyper    (Blank rows = not tested)  LE MMT:  MMT Right  Left  Left 03/08/2022  Hip flexion  4+/5 5/5  Hip extension     Hip abduction     Knee flexion  5/5 5/5  Knee extension  4+/5 5/5  Ankle dorsiflexion  5/5   Ankle plantarflexion  5/5   Ankle inversion     Ankle eversion      (Blank rows = not tested)   FUNCTIONAL TESTS:  -Dead lift: Good form and depth with no pain    TODAY'S TREATMENT: [facility] Adult PT Treatment:                                                DATE: 05/14/2022 Therapeutic Exercise: Recumbent bike performing forward full revolutions x 5 min Quad set 10 x 5 sec Quad set with a strap for hyperextension 10 x 5 sec Quad set with heel prop 10 x 5 sec SLR 2 x 10 Prone TKE 2 x 10 Standing TKE with green 2 x 10 Ambulating with single crutch, focus on proper heel-toe progression and knee flexion with swing Manual Therapy: Supine patellar mobs all directions Self Care: Re-sizing and proper  fit of T-scope knee brace   [facility] Adult PT Treatment:                                                DATE: 05/10/2022 Therapeutic Exercise: Supine Lt heel slides 2x10 Supine Lt SLR with hip abduction  Right side knee plank with Lt hip abduction 3x10 Seated LAQ from 90-45 degrees knee flexion 3x10 with 3-sec hold on Lt Squat to  90 degrees knee flexion with UE support 3x8, weight-bearing through Rt LE Seated active hamstring stretch x36mn on Lt  [facility]                                                [facility] Therapeutic Exercise: Quad set 10 x 5 sec Quad set with heel prop 10 x 5 sec SLR 3 x 5 - cued to flex foot and perform quad set prior to lift Supine active heel slide 10 x 5 sec Modalities: E-stim attended: [place] 10 minutes with active quad set and heel prop 10:50 sec on:off, 2 sec ramp    PATIENT EDUCATION: Education details: HEP, continued focus on quad activation and performing SLR without lag, gradual progression of knee flexion Person educated: Patient Education method: Explanation, Demonstration, Tactile cues, Verbal cues Education comprehension: verbalized understanding, returned demonstration, verbal cues required, tactile cues required, and needs further education   HOME EXERCISE PROGRAM: Access Code: [email or web address]   ASSESSMENT: CLINICAL IMPRESSION: Patient tolerated therapy well with no adverse effects. He demonstrates improve quad set and SLR without lag this visit but continues to have difficulty with active knee hyper extension. His knee flexion was improved and at this point has no range of motion restrictions, but was instructed in gradual progression of knee flexion to avoid being too aggressive. Worked on gait training with single crutch and properly fit his T-scope knee brace. Patient would benefit from continued skilled PT to progress per protocol in order to return to prior level of function.     GOALS: SHORT TERM GOALS:   STG Name  Target Date Goal status  1 Pt will be independent and compliant with HEP for improved pain, strength, and ROM.  Baseline: New HEP administered 05/02/2022 05/10/2022: Achieved 06/13/2022 ACHIEVED  2 Pt will demo a good quad set and perform supine SLRs independently with brace for improved quad strength. Baseline: Pain with quad setting/ SLR 05/10/2022: achieved 06/13/2022 ACHIEVED  3 Pt will demo improved improved L knee extension PROM/AROM to 0/3 and flexion PROM to 60 deg for improved mobility and stiffness. Baseline: -3 to 78 degrees AROM 06/13/2022 INITIAL (updated)  4 Pt will demo improved improved L knee extension AROM to 0 deg and  flexion AAROM/PROM to 80/90 deg for improved mobility and stiffness. Baseline: -3 to 78 degrees AROM 05/10/2022: 0-90 degrees 06/13/2022 ACHIEVED  5 Pt will demo L knee AROM to be 0 - 120 deg for improved stiffness and performance of functional mobility.   Baseline: -3 to 78 degrees AROM  06/13/2022 INITIAL (Updated)  6 Pt will progress Wb'ing with gait per MD orders and protocol without adverse effects.  Baseline: 06/13/2022 INITIAL (updated)  7 Pt will progress with closed chain exercises per protocol without adverse effects for improved functional strength and stability.  Baseline: 06/13/2022 INITIAL (updated)  8 Pt will score 0-1 on the lateral step down test on a 4 inch step for improved quad eccentric control.   06/13/2022   INITIAL (updated)  9 Pt will score 0-1 on the lateral step down test on a 6 inch step for improved quad eccentric control and performance of stairs.   06/13/2022 INITIAL (updated)  10 Pt will ambulate with a normalized heel to toe gait without limping without AD Baseline: Pt ambulates with swing-through gait with axillary crutches 06/13/2022 INITIAL (  updated)    LONG TERM GOALS:    LTG Name Target Date Goal status  1 Pt will be able to perform his ADLs/IADLs and normal functional mobility skills without significant difficulty or pain.  Baseline: Pt  reports some moderate difficulty with getting into bed and crouching at work 08/01/2022   INITIAL (updated)  2 Pt will ambulate extended community distance without an AD without increased pain or difficulty.  Baseline: Unable to ambulate without crutches 08/01/2022 INITIAL (updated)  3 Pt will be able to perform his normal daily transfers without difficulty.  Baseline: moderate difficulty 08/01/2022 INITIAL (updated)  4 Pt will demo good squatting form, symmetrical Wb'ing, and good knee alignment without significant pain for improved functional strength and tolerance with IADLs.  Baseline: Unable to perform post-op 08/01/2022 INITIAL (updated)  5 Pt will be able to perform stairs with a reciprocal gait without the rail with good control.  Baseline: Unable to perform post-op, uses crutches 08/01/2022 INITIAL (updated)  6 Pt will be able to perform all of his school activities and work activities without significant pain or difficulty.   Baseline: unable 08/01/2022 INITIAL (updated)  7 Pt will demo 5/5 L hip and knee strength for improved tolerance with and performance of functional mobility skills and to assist in returning to PLOF.   Baseline: 08/01/2022 INITIAL (updated)     PLAN: PT FREQUENCY: 2-3x/week   PT DURATION: other: 12 weeks   PLANNED INTERVENTIONS: Therapeutic exercises, Therapeutic activity, Neuro Muscular re-education, Balance training, Gait training, Patient/Family education, Joint mobilization, Stair training, DME instructions, Aquatic Therapy, Electrical stimulation, Cryotherapy, Moist heat, Taping, and Manual therapy   PLAN FOR NEXT SESSION: Quad activation and progression of active hyperextension, continue gradual progression of knee flexion, gait training, gradually progress closed chain strengthening as tolerated   Hilda Blades, PT, DPT, LAT, ATC 05/14/22  6:03 PM Phone: [phone] Fax: [phone]

## 2022-05-14 NOTE — Progress Notes (Signed)
Post Operative Evaluation    Procedure/Date of Surgery: Left knee ACL reconstruction with quadriceps tendon allograft and lateral meniscus repair 04/30/22  Interval History:   Presents 2 weeks status post left knee ACL reconstruction with quadriceps tendon allograft.  Overall he is doing extremely well.  He has put weight on the foot and not experience any pain.  He has been compliant with aspirin usage.  He has begun physical therapy.   PMH/PSH/Family History/Social History/Meds/Allergies:    Past Medical History:  Diagnosis Date   Allergy    Asthma    out grown now    PONV (postoperative nausea and vomiting)    Past Surgical History:  Procedure Laterality Date   KNEE ARTHROSCOPY Left 02/02/2022   Procedure: LEFT ARTHROSCOPY KNEE LYSIS OF ADHESION AND MANIPULATION UNDER ANESTHESIA;  Surgeon: Huel Cote, MD;  Location: [facility];  Service: Orthopedics;  Laterality: Left;   KNEE ARTHROSCOPY WITH ANTERIOR CRUCIATE LIGAMENT (ACL) REPAIR WITH HAMSTRING GRAFT Left 11/08/2021   Procedure: LEFT KNEE ANTERIOR CRUCIATE LIGAMENT RECONSTRUCTION WITH QUADRICEPS TENDON AUTOGRAFT;  Surgeon: Huel Cote, MD;  Location: [facility];  Service: Orthopedics;  Laterality: Left;   KNEE ARTHROSCOPY WITH ANTERIOR CRUCIATE LIGAMENT (ACL) REPAIR WITH HAMSTRING GRAFT Left 04/30/2022   Procedure: LEFT KNEE ARTHROSCOPY WITH ANTERIOR CRUCIATE LIGAMENT (ACL) REPAIR WITH QUADRICEPS ALLOGRAFT AND LATERAL MENISCAL REPAIR;  Surgeon: Huel Cote, MD;  Location[facility];  Service: Orthopedics;  Laterality: Left;   KNEE ARTHROSCOPY WITH MENISCAL REPAIR  11/08/2021   Procedure: LEFT LATERAL MENISCAL REPAIR;  Surgeon: Huel Cote, MD;  Location: [facility];  Service: Orthopedics;;   WISDOM TOOTH EXTRACTION  2022   Social History   Socioeconomic History   Marital status: Single    Spouse name: Not on file   Number of children: 1    Years of education: Not on file   Highest education level: Not on file  Occupational History   Occupation: Student    Comment: [employer]  Tobacco Use   Smoking status: Never   Smokeless tobacco: Never  Vaping Use   Vaping Use: Never used  Substance and Sexual Activity   Alcohol use: Not Currently    Comment: rare   Drug use: Yes    Types: Marijuana    Comment: Delta 8 THC, 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   Sexual activity: Yes  Other Topics Concern   Not on file  Social History Narrative   Not on file   Social Determinants of Health   Financial Resource Strain: Not on file  Food Insecurity: Not on file  Transportation Needs: Not on file  Physical Activity: Not on file  Stress: Not on file  Social Connections: Not on file   Family History  Problem Relation Age of Onset   Obesity Mother    Hyperlipidemia Father    Obesity Father    Obesity Maternal Grandmother    Stroke Maternal Grandfather    Diabetes Paternal Grandmother    Allergies  Allergen Reactions   Other Swelling    All citrus fruit with the exception of lime   Current Outpatient Medications  Medication Sig Dispense Refill   acetaminophen (TYLENOL) 500 MG tablet Take 500 mg by mouth every 6 (six) hours as needed.     aspirin EC 325 MG  tablet Take 1 tablet (325 mg total) by mouth daily. 30 tablet 0   EPINEPHrine 0.3 mg/0.3 mL IJ SOAJ injection Inject 0.3 mg into the muscle as needed for anaphylaxis. 2 each 1   ibuprofen (ADVIL) 800 MG tablet Take 800 mg by mouth every 8 (eight) hours as needed.     No current facility-administered medications for this visit.   No results found.  Review of Systems:   A ROS was performed including pertinent positives and negatives as documented in the HPI.   Musculoskeletal Exam:    There were no vitals taken for this visit.  Left knee incisions are well-appearing.  No erythema or drainage.  Lachman is negative.  No joint line tenderness.  Range of motion is from 0 to 125  degrees without pain.  Imaging:    None  I personally reviewed and interpreted the radiographs.   Assessment:   21 year old male who is 2 weeks status post left knee ACL reconstruction with quadriceps tendon allograft and lateral meniscus repair overall doing extremely well.  At today's visit I would like to advance his weightbearing to weightbearing as tolerated.  He will continue to wean his crutches.  He will be in an unlocked brace.  He will continue physical therapy.  Plan :    -Return to clinic in 1 month      I personally saw and evaluated the patient, and participated in the management and treatment plan.  Huel Cote, MD Attending Physician, Orthopedic Surgery  This document was dictated using Dragon voice recognition software. A reasonable attempt at proof reading has been made to minimize errors.

## 2022-05-17 ENCOUNTER — Ambulatory Visit: Payer: No Typology Code available for payment source

## 2022-05-17 ENCOUNTER — Encounter (HOSPITAL_BASED_OUTPATIENT_CLINIC_OR_DEPARTMENT_OTHER): Payer: Self-pay | Admitting: Orthopaedic Surgery

## 2022-05-21 ENCOUNTER — Encounter: Payer: Self-pay | Admitting: Physical Therapy

## 2022-05-21 ENCOUNTER — Ambulatory Visit: Payer: No Typology Code available for payment source | Admitting: Physical Therapy

## 2022-05-21 DIAGNOSIS — M25562 Pain in left knee: Secondary | ICD-10-CM | POA: Diagnosis not present

## 2022-05-21 DIAGNOSIS — M6281 Muscle weakness (generalized): Secondary | ICD-10-CM

## 2022-05-21 DIAGNOSIS — M25662 Stiffness of left knee, not elsewhere classified: Secondary | ICD-10-CM

## 2022-05-21 NOTE — Therapy (Signed)
OUTPATIENT PHYSICAL THERAPY TREATMENT NOTE  Patient Name: Dustin Parks MRN: [id number] DOB:05/05/2001, 21 y.o., male Today's Date: 05/21/2022  PCP: Haydee Salter, MD REFERRING PROVIDER: Dr Donivan Scull    PT End of Session - 05/21/22 1109     Visit Number 45    Number of Visits 9    Date for PT Re-Evaluation 08/01/22    Authorization Type [facility] [employer]    Authorization Time Period 03/19/2022 - 06/19/2022    Authorization - Visit Number 11    Authorization - Number of Visits 16    PT Start Time 1108   pt though appt was scheduled at 10:45, moved session to 11 which he was 8 min late   PT Stop Time 1155    PT Time Calculation (min) 47 min    Activity Tolerance Patient tolerated treatment well                            Past Medical History:  Diagnosis Date   Allergy    Asthma    out grown now    PONV (postoperative nausea and vomiting)    Past Surgical History:  Procedure Laterality Date   KNEE ARTHROSCOPY Left 02/02/2022   Procedure: LEFT ARTHROSCOPY KNEE LYSIS OF ADHESION AND MANIPULATION UNDER ANESTHESIA;  Surgeon: [name], MD;  Location: [place];  Service: Orthopedics;  Laterality: Left;   KNEE ARTHROSCOPY WITH ANTERIOR CRUCIATE LIGAMENT (ACL) REPAIR WITH HAMSTRING GRAFT Left 11/08/2021   Procedure: LEFT KNEE ANTERIOR CRUCIATE LIGAMENT RECONSTRUCTION WITH QUADRICEPS TENDON AUTOGRAFT;  Surgeon: [name], MD;  Location: [place];  Service: Orthopedics;  Laterality: Left;   KNEE ARTHROSCOPY WITH ANTERIOR CRUCIATE LIGAMENT (ACL) REPAIR WITH HAMSTRING GRAFT Left 04/30/2022   Procedure: LEFT KNEE ARTHROSCOPY WITH ANTERIOR CRUCIATE LIGAMENT (ACL) REPAIR WITH QUADRICEPS ALLOGRAFT AND LATERAL MENISCAL REPAIR;  Surgeon: [name], MD;  Location: [place];  Service: Orthopedics;  Laterality: Left;   KNEE ARTHROSCOPY WITH MENISCAL REPAIR  11/08/2021   Procedure: LEFT LATERAL MENISCAL REPAIR;  Surgeon: [name], MD;  Location: [place];  Service: Orthopedics;;   [place] EXTRACTION  2022   Patient Active Problem List   Diagnosis Date Noted   Peripheral tear of lateral meniscus of left knee as current injury    Arthrofibrosis of knee joint, left 01/31/2022   S/P repair of anterior cruciate ligament 01/31/2022   Morbid obesity with BMI of 40.0-44.9, adult ([place]) 01/31/2022   Hypertriglyceridemia 01/31/2022   Angioedema of lips- [place] 12/13/2021   Tear of anterior cruciate ligament graft ([place])    Complex tear of lateral meniscus of left knee as current injury    History of orchitis 02/08/2021     PCP: Libby Maw, MD   REFERRING PROVIDER: [name], MD   REFERRING DIAG:  [phone] (ICD-10-CM) - Rupture of anterior cruciate ligament of left knee, initial encounter  S83.272A (ICD-10-CM) - Complex tear of lateral meniscus of left knee as current injury, initial encounter Z98.890 (ICD-10-CM) - S/P left knee arthroscopy    THERAPY DIAG:  Left knee pain, unspecified chronicity   Stiffness of left knee, not elsewhere classified   Muscle weakness (generalized)   Difficulty in walking, not elsewhere classified    PERTINENT HISTORY: 11/08/2021 Left knee ACL reconstruction with quadriceps autograft and lateral meniscal repair.    02/02/2022: Lt knee lysis of adheresion and manipulation under anesthesia  Allergy to certain adhesives including tegaderm  per pt report   Re-rupture of Lt ACL and Lt meniscal tear with subsequent repair on 05/02/2022   ONSET DATE: Injury 10/09/2021 / Surgery 11/08/2021; subsequent Lt ACL and meniscus tear and repair on 05/02/2022     SUBJECTIVE: "doing pretty good, started new job in the kitchen. Knee is doing pretty good. I've been having trouble working working with a crutch."  PAIN:  Are you having pain? No NPRS scale: 0/10 at rest, 1-2/10 when pain occurs Pain location: Left knee  Pain orientation: Left PAIN TYPE:  Aching Pain description: intermittent  Aggravating factors: use of the kne Relieving factors: rest     PRECAUTIONS: Other: per Dr. Eddie Dibbles ACL reconstruction with meniscal repair   WEIGHT BEARING RESTRICTIONS Yes   PLOF: Independent; Pt was able to perform all of his ADLs/IADLs and functional mobility skills without difficulty or limitations.  Pt was able to perform work activities.  Pt ambulated without an AD with good stability without difficulty.    PATIENT GOALS improved stability with leaning, to have the ability to run, to be able to bowl.  Return to PLOF.      OBJECTIVE:  *Unless otherwise noted, objective measures collected previously*  LE ROM:  A/PROM Left 02/03/2022 Left 05/02/2022 Left 05/04/2022 Left 05/07/2022 Left 05/14/2022  Knee flexion 93 AROM/ 97 AAROM/ 103PROM/ 106 PROM following MET 78 AROM, 81 AAROM with strap 82 AROM, 86 AAROM with strap 90 deg AROM heel slide 100 deg  Knee extension -1 AROM/ 0 PROM -3 AROM  4 hyper    (Blank rows = not tested)  LE MMT:  MMT Right  Left  Left 03/08/2022  Hip flexion  4+/5 5/5  Hip extension     Hip abduction     Knee flexion  5/5 5/5  Knee extension  4+/5 5/5  Ankle dorsiflexion  5/5   Ankle plantarflexion  5/5   Ankle inversion     Ankle eversion      (Blank rows = not tested)   FUNCTIONAL TESTS:  -Dead lift: Good form and depth with no pain    TODAY'S TREATMENT: [facility] Adult PT Treatment:                                                DATE: 05/21/2022 Total arc AROM  8 - 106  Therapeutic Exercise: Quad set combined with [place] x 5  min with heel propped SLR with quad set lifting 6 inch off table x 5 min Prone knee flexion hip with hip extension 2 x 15 Recumbent bike x 5 min full revolutions Standing TKE 1 x 10  Manual Therapy: Tibiofemoral mobs grade IV AP to promote Neuromuscular re-ed: Stadning quad activation with LLE advanced rocking forward abd backward combined with ankle DF 1 x  12 Modalities: Russian x 10 min 10/10, duration 300, frequency PPS, Ramp 2.0   [facility] Adult PT Treatment:                                                DATE: 05/14/2022 Therapeutic Exercise: Recumbent bike performing forward full revolutions x 5 min Quad set 10 x 5 sec Quad set with a strap for hyperextension 10 x 5 sec Quad set with heel  prop 10 x 5 sec SLR 2 x 10 Prone TKE 2 x 10 Standing TKE with green 2 x 10 Ambulating with single crutch, focus on proper heel-toe progression and knee flexion with swing Manual Therapy: Supine patellar mobs all directions Self Care: Re-sizing and proper fit of T-scope knee brace   [facility] Adult PT Treatment:                                                DATE: 05/10/2022 Therapeutic Exercise: Supine Lt heel slides 2x10 Supine Lt SLR with hip abduction  Right side knee plank with Lt hip abduction 3x10 Seated LAQ from 90-45 degrees knee flexion 3x10 with 3-sec hold on Lt Squat to 90 degrees knee flexion with UE support 3x8, weight-bearing through Rt LE Seated active hamstring stretch x23mn on Lt   PATIENT EDUCATION: Education details: HEP, continued focus on quad activation and performing SLR without lag, gradual progression of knee flexion Person educated: Patient Education method: Explanation, Demonstration, Tactile cues, Verbal cues Education comprehension: verbalized understanding, returned demonstration, verbal cues required, tactile cues required, and needs further education   HOME EXERCISE PROGRAM: Access Code: [email or web address]   ASSESSMENT: CLINICAL IMPRESSION: PT arrives to session late but was able to gt a full session at the next open slot. He is making progress with knee ROM with total arc ROM 8- 106. Continued working in knee flexion with AP mobs. Worked on knee extension with quad activation using [place] with quad set and progressed to SLR. He did well with standing TKE but did report some soreness located at the distal patellar  tendon that stopped with rest.      GOALS: SHORT TERM GOALS:   STG Name Target Date Goal status  1 Pt will be independent and compliant with HEP for improved pain, strength, and ROM.  Baseline: New HEP administered 05/02/2022 05/10/2022: Achieved 06/13/2022 ACHIEVED  2 Pt will demo a good quad set and perform supine SLRs independently with brace for improved quad strength. Baseline: Pain with quad setting/ SLR 05/10/2022: achieved 06/13/2022 ACHIEVED  3 Pt will demo improved improved L knee extension PROM/AROM to 0/3 and flexion PROM to 60 deg for improved mobility and stiffness. Baseline: -3 to 78 degrees AROM 06/13/2022 INITIAL (updated)  4 Pt will demo improved improved L knee extension AROM to 0 deg and  flexion AAROM/PROM to 80/90 deg for improved mobility and stiffness. Baseline: -3 to 78 degrees AROM 05/10/2022: 0-90 degrees 06/13/2022 ACHIEVED  5 Pt will demo L knee AROM to be 0 - 120 deg for improved stiffness and performance of functional mobility.   Baseline: -3 to 78 degrees AROM  06/13/2022 INITIAL (Updated)  6 Pt will progress Wb'ing with gait per MD orders and protocol without adverse effects.  Baseline: 06/13/2022 INITIAL (updated)  7 Pt will progress with closed chain exercises per protocol without adverse effects for improved functional strength and stability.  Baseline: 06/13/2022 INITIAL (updated)  8 Pt will score 0-1 on the lateral step down test on a 4 inch step for improved quad eccentric control.   06/13/2022   INITIAL (updated)  9 Pt will score 0-1 on the lateral step down test on a 6 inch step for improved quad eccentric control and performance of stairs.   06/13/2022 INITIAL (updated)  10 Pt will ambulate with a normalized heel to toe gait  without limping without AD Baseline: Pt ambulates with swing-through gait with axillary crutches 06/13/2022 INITIAL (updated)    LONG TERM GOALS:    LTG Name Target Date Goal status  1 Pt will be able to perform his ADLs/IADLs and normal  functional mobility skills without significant difficulty or pain.  Baseline: Pt reports some moderate difficulty with getting into bed and crouching at work 08/01/2022   INITIAL (updated)  2 Pt will ambulate extended community distance without an AD without increased pain or difficulty.  Baseline: Unable to ambulate without crutches 08/01/2022 INITIAL (updated)  3 Pt will be able to perform his normal daily transfers without difficulty.  Baseline: moderate difficulty 08/01/2022 INITIAL (updated)  4 Pt will demo good squatting form, symmetrical Wb'ing, and good knee alignment without significant pain for improved functional strength and tolerance with IADLs.  Baseline: Unable to perform post-op 08/01/2022 INITIAL (updated)  5 Pt will be able to perform stairs with a reciprocal gait without the rail with good control.  Baseline: Unable to perform post-op, uses crutches 08/01/2022 INITIAL (updated)  6 Pt will be able to perform all of his school activities and work activities without significant pain or difficulty.   Baseline: unable 08/01/2022 INITIAL (updated)  7 Pt will demo 5/5 L hip and knee strength for improved tolerance with and performance of functional mobility skills and to assist in returning to PLOF.   Baseline: 08/01/2022 INITIAL (updated)     PLAN: PT FREQUENCY: 2-3x/week   PT DURATION: other: 12 weeks   PLANNED INTERVENTIONS: Therapeutic exercises, Therapeutic activity, Neuro Muscular re-education, Balance training, Gait training, Patient/Family education, Joint mobilization, Stair training, DME instructions, Aquatic Therapy, Electrical stimulation, Cryotherapy, Moist heat, Taping, and Manual therapy   PLAN FOR NEXT SESSION: Quad activation and progression of active hyperextension, continue gradual progression of knee flexion, gait training, gradually progress closed chain strengthening as tolerated   Karys Meckley PT, DPT, LAT, ATC  05/21/22  11:58 AM

## 2022-05-24 ENCOUNTER — Ambulatory Visit: Payer: No Typology Code available for payment source

## 2022-05-28 ENCOUNTER — Encounter: Payer: Self-pay | Admitting: Physical Therapy

## 2022-05-28 ENCOUNTER — Ambulatory Visit: Payer: No Typology Code available for payment source | Admitting: Physical Therapy

## 2022-05-28 DIAGNOSIS — M25562 Pain in left knee: Secondary | ICD-10-CM | POA: Diagnosis not present

## 2022-05-28 DIAGNOSIS — R262 Difficulty in walking, not elsewhere classified: Secondary | ICD-10-CM

## 2022-05-28 DIAGNOSIS — M25662 Stiffness of left knee, not elsewhere classified: Secondary | ICD-10-CM

## 2022-05-28 DIAGNOSIS — M6281 Muscle weakness (generalized): Secondary | ICD-10-CM

## 2022-05-28 NOTE — Therapy (Signed)
OUTPATIENT PHYSICAL THERAPY TREATMENT NOTE  Patient Name: Dustin Parks MRN: [id number] DOB:October 12, 2001, 21 y.o., male Today's Date: 05/28/2022  PCP: Haydee Salter, MD REFERRING PROVIDER: Dr Donivan Scull    PT End of Session - 05/28/22 1019     Visit Number 58    Number of Visits 11    Date for PT Re-Evaluation 08/01/22    Authorization Type [facility] [employer]    Authorization Time Period 03/19/2022 - 06/19/2022    Authorization - Visit Number 12    Authorization - Number of Visits 16    PT Start Time 1019    PT Stop Time 1100    PT Time Calculation (min) 41 min    Activity Tolerance Patient tolerated treatment well    Behavior During Therapy WFL for tasks assessed/performed                             Past Medical History:  Diagnosis Date   Allergy    Asthma    out grown now    PONV (postoperative nausea and vomiting)    Past Surgical History:  Procedure Laterality Date   KNEE ARTHROSCOPY Left 02/02/2022   Procedure: LEFT ARTHROSCOPY KNEE LYSIS OF ADHESION AND MANIPULATION UNDER ANESTHESIA;  Surgeon: [name], MD;  Location: [place];  Service: Orthopedics;  Laterality: Left;   KNEE ARTHROSCOPY WITH ANTERIOR CRUCIATE LIGAMENT (ACL) REPAIR WITH HAMSTRING GRAFT Left 11/08/2021   Procedure: LEFT KNEE ANTERIOR CRUCIATE LIGAMENT RECONSTRUCTION WITH QUADRICEPS TENDON AUTOGRAFT;  Surgeon: [name], MD;  Location: [place];  Service: Orthopedics;  Laterality: Left;   KNEE ARTHROSCOPY WITH ANTERIOR CRUCIATE LIGAMENT (ACL) REPAIR WITH HAMSTRING GRAFT Left 04/30/2022   Procedure: LEFT KNEE ARTHROSCOPY WITH ANTERIOR CRUCIATE LIGAMENT (ACL) REPAIR WITH QUADRICEPS ALLOGRAFT AND LATERAL MENISCAL REPAIR;  Surgeon: [name], MD;  Location: [place];  Service: Orthopedics;  Laterality: Left;   KNEE ARTHROSCOPY WITH MENISCAL REPAIR  11/08/2021   Procedure: LEFT LATERAL MENISCAL REPAIR;  Surgeon: [name], MD;  Location:  [place];  Service: Orthopedics;;   [place] EXTRACTION  2022   Patient Active Problem List   Diagnosis Date Noted   Peripheral tear of lateral meniscus of left knee as current injury    Arthrofibrosis of knee joint, left 01/31/2022   S/P repair of anterior cruciate ligament 01/31/2022   Morbid obesity with BMI of 40.0-44.9, adult ([place]) 01/31/2022   Hypertriglyceridemia 01/31/2022   Angioedema of lips- [place] 12/13/2021   Tear of anterior cruciate ligament graft ([place])    Complex tear of lateral meniscus of left knee as current injury    History of orchitis 02/08/2021     PCP: Libby Maw, MD   REFERRING PROVIDER: [name], MD   REFERRING DIAG:  [phone] (ICD-10-CM) - Rupture of anterior cruciate ligament of left knee, initial encounter  S83.272A (ICD-10-CM) - Complex tear of lateral meniscus of left knee as current injury, initial encounter Z98.890 (ICD-10-CM) - S/P left knee arthroscopy    THERAPY DIAG:  Left knee pain, unspecified chronicity   Stiffness of left knee, not elsewhere classified   Muscle weakness (generalized)   Difficulty in walking, not elsewhere classified    PERTINENT HISTORY: 11/08/2021 Left knee ACL reconstruction with quadriceps autograft and lateral meniscal repair.    02/02/2022: Lt knee lysis of adheresion and manipulation under anesthesia  Allergy to certain adhesives including tegaderm per pt report   Re-rupture of  Lt ACL and Lt meniscal tear with subsequent repair on 05/02/2022   ONSET DATE: Injury 10/09/2021 / Surgery 11/08/2021; subsequent Lt ACL and meniscus tear and repair on 05/02/2022     SUBJECTIVE: " Doing pretty good, no pain or issues."  PAIN:  Are you having pain? No NPRS scale: 0/10 at rest Pain location: Left knee  Pain orientation: Left PAIN TYPE: Aching Pain description: intermittent  Aggravating factors: use of the kne Relieving factors: rest     PRECAUTIONS: Other: per Dr. Eddie Dibbles  ACL reconstruction with meniscal repair   WEIGHT BEARING RESTRICTIONS Yes   PLOF: Independent; Pt was able to perform all of his ADLs/IADLs and functional mobility skills without difficulty or limitations.  Pt was able to perform work activities.  Pt ambulated without an AD with good stability without difficulty.    PATIENT GOALS improved stability with leaning, to have the ability to run, to be able to bowl.  Return to PLOF.      OBJECTIVE:  *Unless otherwise noted, objective measures collected previously*  LE ROM:  A/PROM Left 02/03/2022 Left 05/02/2022 Left 05/04/2022 Left 05/07/2022 Left 05/14/2022  Knee flexion 93 AROM/ 97 AAROM/ 103PROM/ 106 PROM following MET 78 AROM, 81 AAROM with strap 82 AROM, 86 AAROM with strap 90 deg AROM heel slide 100 deg  Knee extension -1 AROM/ 0 PROM -3 AROM  4 hyper    (Blank rows = not tested)  LE MMT:  MMT Right  Left  Left 03/08/2022  Hip flexion  4+/5 5/5  Hip extension     Hip abduction     Knee flexion  5/5 5/5  Knee extension  4+/5 5/5  Ankle dorsiflexion  5/5   Ankle plantarflexion  5/5   Ankle inversion     Ankle eversion      (Blank rows = not tested)   FUNCTIONAL TESTS:  -Dead lift: Good form and depth with no pain    TODAY'S TREATMENT:  [facility] Adult PT Treatment:                                                DATE: 05/28/2022 AROM 2 - 112  Therapeutic Exercise: Recumbent bike L3 x 6 min lowering seat at 3 min to promote knee flexion Quad stretch 2 x 30 sec in thomas test pos Wall squat 2 x 12 Heel raise off edge of step 2 x 20 Step up on to 4 inch step 2 x 10 Manual Therapy: Tibiofemoral mobs grade IV AP to promote MTPR along the rectus femoris   [facility] Adult PT Treatment:                                                DATE: 05/21/2022 Total arc AROM  8 - 106  Therapeutic Exercise: Quad set combined with [place] x 5  min with heel propped SLR with quad set lifting 6 inch off table x 5 min Prone knee flexion hip with  hip extension 2 x 15 Recumbent bike x 5 min full revolutions Standing TKE 1 x 10  Manual Therapy: Tibiofemoral mobs grade IV AP to promote Neuromuscular re-ed: Stadning quad activation with LLE advanced rocking forward abd backward combined with ankle DF 1 x  12 Modalities: Russian x 10 min 10/10, duration 300, frequency PPS, Ramp 2.0   [facility] Adult PT Treatment:                                                DATE: 05/14/2022 Therapeutic Exercise: Recumbent bike performing forward full revolutions x 5 min Quad set 10 x 5 sec Quad set with a strap for hyperextension 10 x 5 sec Quad set with heel prop 10 x 5 sec SLR 2 x 10 Prone TKE 2 x 10 Standing TKE with green 2 x 10 Ambulating with single crutch, focus on proper heel-toe progression and knee flexion with swing Manual Therapy: Supine patellar mobs all directions Self Care: Re-sizing and proper fit of T-scope knee brace   PATIENT EDUCATION: Education details: HEP, continued focus on quad activation and performing SLR without lag, gradual progression of knee flexion Person educated: Patient Education method: Explanation, Demonstration, Tactile cues, Verbal cues Education comprehension: verbalized understanding, returned demonstration, verbal cues required, tactile cues required, and needs further education   HOME EXERCISE PROGRAM: Access Code: [email or web address]    ASSESSMENT: CLINICAL IMPRESSION: Pt arrives to PT noting no pain and is making good progress with his AROM  increasing 2 - 112 degrees today. Pt is 2 days shy of being 4 weeks post-op. Continued working on knee ROM and knee extension in weight bearing staying within the MD protocol precautions. End of session she noted feeling weak but denied any pain.      GOALS: SHORT TERM GOALS:   STG Name Target Date Goal status  1 Pt will be independent and compliant with HEP for improved pain, strength, and ROM.  Baseline: New HEP administered 05/02/2022 05/10/2022: Achieved 06/13/2022  ACHIEVED  2 Pt will demo a good quad set and perform supine SLRs independently with brace for improved quad strength. Baseline: Pain with quad setting/ SLR 05/10/2022: achieved 06/13/2022 ACHIEVED  3 Pt will demo improved improved L knee extension PROM/AROM to 0/3 and flexion PROM to 60 deg for improved mobility and stiffness. Baseline: -3 to 78 degrees AROM 06/13/2022 INITIAL (updated)  4 Pt will demo improved improved L knee extension AROM to 0 deg and  flexion AAROM/PROM to 80/90 deg for improved mobility and stiffness. Baseline: -3 to 78 degrees AROM 05/10/2022: 0-90 degrees 06/13/2022 ACHIEVED  5 Pt will demo L knee AROM to be 0 - 120 deg for improved stiffness and performance of functional mobility.   Baseline: -3 to 78 degrees AROM  06/13/2022 INITIAL (Updated)  6 Pt will progress Wb'ing with gait per MD orders and protocol without adverse effects.  Baseline: 06/13/2022 INITIAL (updated)  7 Pt will progress with closed chain exercises per protocol without adverse effects for improved functional strength and stability.  Baseline: 06/13/2022 INITIAL (updated)  8 Pt will score 0-1 on the lateral step down test on a 4 inch step for improved quad eccentric control.   06/13/2022   INITIAL (updated)  9 Pt will score 0-1 on the lateral step down test on a 6 inch step for improved quad eccentric control and performance of stairs.   06/13/2022 INITIAL (updated)  10 Pt will ambulate with a normalized heel to toe gait without limping without AD Baseline: Pt ambulates with swing-through gait with axillary crutches 06/13/2022 INITIAL (updated)    LONG TERM GOALS:    LTG Name Target Date  Goal status  1 Pt will be able to perform his ADLs/IADLs and normal functional mobility skills without significant difficulty or pain.  Baseline: Pt reports some moderate difficulty with getting into bed and crouching at work 08/01/2022   INITIAL (updated)  2 Pt will ambulate extended community distance without an AD without increased  pain or difficulty.  Baseline: Unable to ambulate without crutches 08/01/2022 INITIAL (updated)  3 Pt will be able to perform his normal daily transfers without difficulty.  Baseline: moderate difficulty 08/01/2022 INITIAL (updated)  4 Pt will demo good squatting form, symmetrical Wb'ing, and good knee alignment without significant pain for improved functional strength and tolerance with IADLs.  Baseline: Unable to perform post-op 08/01/2022 INITIAL (updated)  5 Pt will be able to perform stairs with a reciprocal gait without the rail with good control.  Baseline: Unable to perform post-op, uses crutches 08/01/2022 INITIAL (updated)  6 Pt will be able to perform all of his school activities and work activities without significant pain or difficulty.   Baseline: unable 08/01/2022 INITIAL (updated)  7 Pt will demo 5/5 L hip and knee strength for improved tolerance with and performance of functional mobility skills and to assist in returning to PLOF.   Baseline: 08/01/2022 INITIAL (updated)     PLAN: PT FREQUENCY: 2-3x/week   PT DURATION: other: 12 weeks   PLANNED INTERVENTIONS: Therapeutic exercises, Therapeutic activity, Neuro Muscular re-education, Balance training, Gait training, Patient/Family education, Joint mobilization, Stair training, DME instructions, Aquatic Therapy, Electrical stimulation, Cryotherapy, Moist heat, Taping, and Manual therapy   PLAN FOR NEXT SESSION: Quad activation and progression of active hyperextension, continue gradual progression of knee flexion, gait training, gradually progress closed chain strengthening as tolerated   Katherene Dinino PT, DPT, LAT, ATC  05/28/22  11:05 AM

## 2022-05-31 ENCOUNTER — Ambulatory Visit: Payer: No Typology Code available for payment source

## 2022-06-04 ENCOUNTER — Ambulatory Visit: Payer: No Typology Code available for payment source | Admitting: Physical Therapy

## 2022-06-04 ENCOUNTER — Encounter: Payer: Self-pay | Admitting: Physical Therapy

## 2022-06-04 DIAGNOSIS — M25562 Pain in left knee: Secondary | ICD-10-CM

## 2022-06-04 DIAGNOSIS — M25662 Stiffness of left knee, not elsewhere classified: Secondary | ICD-10-CM

## 2022-06-04 DIAGNOSIS — M6281 Muscle weakness (generalized): Secondary | ICD-10-CM

## 2022-06-04 NOTE — Therapy (Signed)
OUTPATIENT PHYSICAL THERAPY TREATMENT NOTE  Patient Name: Dustin Parks MRN: [id number] DOB:09/17/2001, 21 y.o., male Today's Date: 06/04/2022  PCP: Haydee Salter, Parks REFERRING PROVIDER: Dr Donivan Scull    PT End of Session - 06/04/22 1309     Visit Number 54    Number of Visits 35    Date for PT Re-Evaluation 08/01/22    Authorization Type [facility]    Authorization Time Period 03/19/2022 - 06/19/2022    Authorization - Visit Number 56    Authorization - Number of Visits 16    PT Start Time 1309    PT Stop Time 1349    PT Time Calculation (min) 40 min    Activity Tolerance Patient tolerated treatment well    Behavior During Therapy WFL for tasks assessed/performed                              Past Medical History:  Diagnosis Date   Allergy    Asthma    out grown now    PONV (postoperative nausea and vomiting)    Past Surgical History:  Procedure Laterality Date   KNEE ARTHROSCOPY Left 02/02/2022   Procedure: LEFT ARTHROSCOPY KNEE LYSIS OF ADHESION AND MANIPULATION UNDER ANESTHESIA;  Surgeon: [name], Parks;  Location: [place];  Service: Orthopedics;  Laterality: Left;   KNEE ARTHROSCOPY WITH ANTERIOR CRUCIATE LIGAMENT (ACL) REPAIR WITH HAMSTRING GRAFT Left 11/08/2021   Procedure: LEFT KNEE ANTERIOR CRUCIATE LIGAMENT RECONSTRUCTION WITH QUADRICEPS TENDON AUTOGRAFT;  Surgeon: [name], Parks;  Location: [place];  Service: Orthopedics;  Laterality: Left;   KNEE ARTHROSCOPY WITH ANTERIOR CRUCIATE LIGAMENT (ACL) REPAIR WITH HAMSTRING GRAFT Left 04/30/2022   Procedure: LEFT KNEE ARTHROSCOPY WITH ANTERIOR CRUCIATE LIGAMENT (ACL) REPAIR WITH QUADRICEPS ALLOGRAFT AND LATERAL MENISCAL REPAIR;  Surgeon: [name], Parks;  Location: [place];  Service: Orthopedics;  Laterality: Left;   KNEE ARTHROSCOPY WITH MENISCAL REPAIR  11/08/2021   Procedure: LEFT LATERAL MENISCAL REPAIR;  Surgeon: [name], Parks;  Location:  [place];  Service: Orthopedics;;   [place] EXTRACTION  2022   Patient Active Problem List   Diagnosis Date Noted   Peripheral tear of lateral meniscus of left knee as current injury    Arthrofibrosis of knee joint, left 01/31/2022   S/P repair of anterior cruciate ligament 01/31/2022   Morbid obesity with BMI of 40.0-44.9, adult ([place]) 01/31/2022   Hypertriglyceridemia 01/31/2022   Angioedema of lips- [place] 12/13/2021   Tear of anterior cruciate ligament graft ([place])    Complex tear of lateral meniscus of left knee as current injury    History of orchitis 02/08/2021     PCP: Dustin Parks   REFERRING PROVIDER: [name], Parks   REFERRING DIAG:  [phone] (ICD-10-CM) - Rupture of anterior cruciate ligament of left knee, initial encounter  S83.272A (ICD-10-CM) - Complex tear of lateral meniscus of left knee as current injury, initial encounter Z98.890 (ICD-10-CM) - S/P left knee arthroscopy    THERAPY DIAG:  Left knee pain, unspecified chronicity   Stiffness of left knee, not elsewhere classified   Muscle weakness (generalized)   Difficulty in walking, not elsewhere classified    PERTINENT HISTORY: 11/08/2021 Left knee ACL reconstruction with quadriceps autograft and lateral meniscal repair.    02/02/2022: Lt knee lysis of adheresion and manipulation under anesthesia  Allergy to certain adhesives including tegaderm per pt report   Re-rupture  of Lt ACL and Lt meniscal tear with subsequent repair on 05/02/2022   ONSET DATE: Injury 10/09/2021 / Surgery 11/08/2021; subsequent Lt ACL and meniscus tear and repair on 05/02/2022     SUBJECTIVE: "no issues feeling good."  PAIN:  Are you having pain? No NPRS scale: 0/10 at rest Pain location: Left knee  Pain orientation: Left PAIN TYPE: Aching Pain description: intermittent  Aggravating factors: use of the kne Relieving factors: rest     PRECAUTIONS: Other: per Dr. Eddie Dibbles ACL  reconstruction with meniscal repair   WEIGHT BEARING RESTRICTIONS Yes   PLOF: Independent; Pt was able to perform all of his ADLs/IADLs and functional mobility skills without difficulty or limitations.  Pt was able to perform work activities.  Pt ambulated without an AD with good stability without difficulty.    PATIENT GOALS improved stability with leaning, to have the ability to run, to be able to bowl.  Return to PLOF.      OBJECTIVE:  *Unless otherwise noted, objective measures collected previously*  LE ROM:  A/PROM Left 02/03/2022 Left 05/02/2022 Left 05/04/2022 Left 05/07/2022 Left 05/14/2022  Knee flexion 93 AROM/ 97 AAROM/ 103PROM/ 106 PROM following MET 78 AROM, 81 AAROM with strap 82 AROM, 86 AAROM with strap 90 deg AROM heel slide 100 deg  Knee extension -1 AROM/ 0 PROM -3 AROM  4 hyper    (Blank rows = not tested)  LE MMT:  MMT Right  Left  Left 03/08/2022  Hip flexion  4+/5 5/5  Hip extension     Hip abduction     Knee flexion  5/5 5/5  Knee extension  4+/5 5/5  Ankle dorsiflexion  5/5   Ankle plantarflexion  5/5   Ankle inversion     Ankle eversion      (Blank rows = not tested)   FUNCTIONAL TESTS:  -Dead lift: Good form and depth with no pain    TODAY'S TREATMENT:  [facility] Adult PT Treatment:                                                DATE: 06/04/2022 AROM flexion 122  Therapeutic Exercise: Recumbent bike L3 x 6 min lowering seat at 3 min to promote knee flexion Hamstring stretch with strap 2 x 30 sec supine Quad stretch 2 x30 with strap in prone Hamstring curl with hip extension 2 x 15 with 6# L hip abduction 1 set going to fatigue with 6# Backward walking on treadmil x 2 x 2 min Step down with LLE using 4 inch step to avoid going beyoned precaution  [facility] Adult PT Treatment:                                                DATE: 05/28/2022 AROM 2 - 112  Therapeutic Exercise: Recumbent bike L3 x 6 min lowering seat at 3 min to promote knee  flexion Quad stretch 2 x 30 sec in thomas test pos Wall squat 2 x 12 Heel raise off edge of step 2 x 20 Step up on to 4 inch step 2 x 10 Manual Therapy: Tibiofemoral mobs grade IV AP to promote MTPR along the rectus femoris   [facility] Adult PT Treatment:  DATE: 05/21/2022 Total arc AROM  8 - 106  Therapeutic Exercise: Quad set combined with [place] x 5  min with heel propped SLR with quad set lifting 6 inch off table x 5 min Prone knee flexion hip with hip extension 2 x 15 Recumbent bike x 5 min full revolutions Standing TKE 1 x 10  Manual Therapy: Tibiofemoral mobs grade IV AP to promote Neuromuscular re-ed: Stadning quad activation with LLE advanced rocking forward abd backward combined with ankle DF 1 x 12 Modalities: Russian x 10 min 10/10, duration 300, frequency PPS, Ramp 2.0     PATIENT EDUCATION: Education details: HEP, continued focus on quad activation and performing SLR without lag, gradual progression of knee flexion Person educated: Patient Education method: Explanation, Demonstration, Tactile cues, Verbal cues Education comprehension: verbalized understanding, returned demonstration, verbal cues required, tactile cues required, and needs further education   HOME EXERCISE PROGRAM: Access Code: [email or web address]    ASSESSMENT: CLINICAL IMPRESSION: Rojelio is making good progress with physical therapy increasing knee flexion to 122 degrees. Today marks 5 weeks post-op which he is making good progress with. Continued working on quad and hamstring strength as well as more proximal strengthening at the hip. He reported he was feeling pretty good today and denied any pain during or following session.      GOALS: SHORT TERM GOALS:   STG Name Target Date Goal status  1 Pt will be independent and compliant with HEP for improved pain, strength, and ROM.  Baseline: New HEP administered 05/02/2022 05/10/2022: Achieved 06/13/2022 ACHIEVED   2 Pt will demo a good quad set and perform supine SLRs independently with brace for improved quad strength. Baseline: Pain with quad setting/ SLR 05/10/2022: achieved 06/13/2022 ACHIEVED  3 Pt will demo improved improved L knee extension PROM/AROM to 0/3 and flexion PROM to 60 deg for improved mobility and stiffness. Baseline: -3 to 78 degrees AROM 06/13/2022 INITIAL (updated)  4 Pt will demo improved improved L knee extension AROM to 0 deg and  flexion AAROM/PROM to 80/90 deg for improved mobility and stiffness. Baseline: -3 to 78 degrees AROM 05/10/2022: 0-90 degrees 06/13/2022 ACHIEVED  5 Pt will demo L knee AROM to be 0 - 120 deg for improved stiffness and performance of functional mobility.   Baseline: -3 to 78 degrees AROM  06/13/2022 INITIAL (Updated)  6 Pt will progress Wb'ing with gait per Parks orders and protocol without adverse effects.  Baseline: 06/13/2022 INITIAL (updated)  7 Pt will progress with closed chain exercises per protocol without adverse effects for improved functional strength and stability.  Baseline: 06/13/2022 INITIAL (updated)  8 Pt will score 0-1 on the lateral step down test on a 4 inch step for improved quad eccentric control.   06/13/2022   INITIAL (updated)  9 Pt will score 0-1 on the lateral step down test on a 6 inch step for improved quad eccentric control and performance of stairs.   06/13/2022 INITIAL (updated)  10 Pt will ambulate with a normalized heel to toe gait without limping without AD Baseline: Pt ambulates with swing-through gait with axillary crutches 06/13/2022 INITIAL (updated)    LONG TERM GOALS:    LTG Name Target Date Goal status  1 Pt will be able to perform his ADLs/IADLs and normal functional mobility skills without significant difficulty or pain.  Baseline: Pt reports some moderate difficulty with getting into bed and crouching at work 08/01/2022   INITIAL (updated)  2 Pt will ambulate extended community distance without an AD  without increased pain or  difficulty.  Baseline: Unable to ambulate without crutches 08/01/2022 INITIAL (updated)  3 Pt will be able to perform his normal daily transfers without difficulty.  Baseline: moderate difficulty 08/01/2022 INITIAL (updated)  4 Pt will demo good squatting form, symmetrical Wb'ing, and good knee alignment without significant pain for improved functional strength and tolerance with IADLs.  Baseline: Unable to perform post-op 08/01/2022 INITIAL (updated)  5 Pt will be able to perform stairs with a reciprocal gait without the rail with good control.  Baseline: Unable to perform post-op, uses crutches 08/01/2022 INITIAL (updated)  6 Pt will be able to perform all of his school activities and work activities without significant pain or difficulty.   Baseline: unable 08/01/2022 INITIAL (updated)  7 Pt will demo 5/5 L hip and knee strength for improved tolerance with and performance of functional mobility skills and to assist in returning to PLOF.   Baseline: 08/01/2022 INITIAL (updated)     PLAN: PT FREQUENCY: 2-3x/week   PT DURATION: other: 12 weeks   PLANNED INTERVENTIONS: Therapeutic exercises, Therapeutic activity, Neuro Muscular re-education, Balance training, Gait training, Patient/Family education, Joint mobilization, Stair training, DME instructions, Aquatic Therapy, Electrical stimulation, Cryotherapy, Moist heat, Taping, and Manual therapy   PLAN FOR NEXT SESSION: Quad activation and progression of active hyperextension, continue gradual progression of knee flexion, gait training, gradually progress closed chain strengthening as tolerated   Keilan Nichol PT, DPT, LAT, ATC  06/04/22  1:52 PM

## 2022-06-05 ENCOUNTER — Ambulatory Visit (INDEPENDENT_AMBULATORY_CARE_PROVIDER_SITE_OTHER): Payer: No Typology Code available for payment source | Admitting: Behavioral Health

## 2022-06-05 DIAGNOSIS — F4323 Adjustment disorder with mixed anxiety and depressed mood: Secondary | ICD-10-CM

## 2022-06-05 NOTE — Progress Notes (Signed)
                Dustin Parks L Opha Mcghee, LMFT 

## 2022-06-06 ENCOUNTER — Encounter (HOSPITAL_BASED_OUTPATIENT_CLINIC_OR_DEPARTMENT_OTHER): Payer: Self-pay | Admitting: Orthopaedic Surgery

## 2022-06-06 ENCOUNTER — Ambulatory Visit (INDEPENDENT_AMBULATORY_CARE_PROVIDER_SITE_OTHER): Payer: No Typology Code available for payment source | Admitting: Orthopaedic Surgery

## 2022-06-06 ENCOUNTER — Ambulatory Visit: Payer: No Typology Code available for payment source

## 2022-06-06 DIAGNOSIS — T8489XA Other specified complication of internal orthopedic prosthetic devices, implants and grafts, initial encounter: Secondary | ICD-10-CM

## 2022-06-06 NOTE — Progress Notes (Signed)
Post Operative Evaluation    Procedure/Date of Surgery: Left knee ACL reconstruction with quadriceps tendon allograft and lateral meniscus repair 04/30/22  Interval History:   Presents 6 weeks status post left knee ACL reconstruction with quadriceps tendon allograft.  Overall he is doing well.  He does state that he did have a fall when he slipped on mud this brace was in place at that time.  Denies any instability or swelling after the injury.   PMH/PSH/Family History/Social History/Meds/Allergies:    Past Medical History:  Diagnosis Date   Allergy    Asthma    out grown now    PONV (postoperative nausea and vomiting)    Past Surgical History:  Procedure Laterality Date   KNEE ARTHROSCOPY Left 02/02/2022   Procedure: LEFT ARTHROSCOPY KNEE LYSIS OF ADHESION AND MANIPULATION UNDER ANESTHESIA;  Surgeon: Huel Cote, MD;  Location: [facility];  Service: Orthopedics;  Laterality: Left;   KNEE ARTHROSCOPY WITH ANTERIOR CRUCIATE LIGAMENT (ACL) REPAIR WITH HAMSTRING GRAFT Left 11/08/2021   Procedure: LEFT KNEE ANTERIOR CRUCIATE LIGAMENT RECONSTRUCTION WITH QUADRICEPS TENDON AUTOGRAFT;  Surgeon: Huel Cote, MD;  Location: [facility];  Service: Orthopedics;  Laterality: Left;   KNEE ARTHROSCOPY WITH ANTERIOR CRUCIATE LIGAMENT (ACL) REPAIR WITH HAMSTRING GRAFT Left 04/30/2022   Procedure: LEFT KNEE ARTHROSCOPY WITH ANTERIOR CRUCIATE LIGAMENT (ACL) REPAIR WITH QUADRICEPS ALLOGRAFT AND LATERAL MENISCAL REPAIR;  Surgeon: Huel Cote, MD;  Location: [facility];  Service: Orthopedics;  Laterality: Left;   KNEE ARTHROSCOPY WITH MENISCAL REPAIR  11/08/2021   Procedure: LEFT LATERAL MENISCAL REPAIR;  Surgeon: Huel Cote, MD;  Location: [facility];  Service: Orthopedics;;   WISDOM TOOTH EXTRACTION  2022   Social History   Socioeconomic History   Marital status: Single    Spouse name: Not on file   Number of  children: 1   Years of education: Not on file   Highest education level: Not on file  Occupational History   Occupation: Student    Comment: [employer]  Tobacco Use   Smoking status: Never   Smokeless tobacco: Never  Vaping Use   Vaping Use: Never used  Substance and Sexual Activity   Alcohol use: Not Currently    Comment: rare   Drug use: Yes    Types: Marijuana    Comment: Delta 8 THC, 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   Sexual activity: Yes  Other Topics Concern   Not on file  Social History Narrative   Not on file   Social Determinants of Health   Financial Resource Strain: Not on file  Food Insecurity: Not on file  Transportation Needs: Not on file  Physical Activity: Not on file  Stress: Not on file  Social Connections: Not on file   Family History  Problem Relation Age of Onset   Obesity Mother    Hyperlipidemia Father    Obesity Father    Obesity Maternal Grandmother    Stroke Maternal Grandfather    Diabetes Paternal Grandmother    Allergies  Allergen Reactions   Other Swelling    All citrus fruit with the exception of lime   Current Outpatient Medications  Medication Sig Dispense Refill   acetaminophen (TYLENOL) 500 MG tablet Take 500 mg by mouth every 6 (six) hours as needed.  aspirin EC 325 MG tablet Take 1 tablet (325 mg total) by mouth daily. 30 tablet 0   EPINEPHrine 0.3 mg/0.3 mL IJ SOAJ injection Inject 0.3 mg into the muscle as needed for anaphylaxis. 2 each 1   ibuprofen (ADVIL) 800 MG tablet Take 800 mg by mouth every 8 (eight) hours as needed.     No current facility-administered medications for this visit.   No results found.  Review of Systems:   A ROS was performed including pertinent positives and negatives as documented in the HPI.   Musculoskeletal Exam:    There were no vitals taken for this visit.  Left knee incisions are well-appearing.  No erythema or drainage.  Lachman is negative.  No joint line tenderness.  Range of motion is  from 0 to 125 degrees without pain.  Significant quad tone and atrophy although improved from last visit  Imaging:    None  I personally reviewed and interpreted the radiographs.   Assessment:   21 year old male who is 6 weeks status post left knee ACL reconstruction with quadriceps tendon allograft and lateral meniscus repair overall doing extremely well.  At this visit I would like him to reach encouraged to advance according to the ACL and meniscus repair protocol.  We will continue to put weight as tolerated on the leg without assistance.  I will plan to see him back in 6 weeks for reassessment.  All questions and concerns were answered.  All restrictions were specifically discussed.  He will continue to wear his brace at all times  Plan :    -Return to clinic in 6 weeks      I personally saw and evaluated the patient, and participated in the management and treatment plan.  Huel Cote, MD Attending Physician, Orthopedic Surgery  This document was dictated using Dragon voice recognition software. A reasonable attempt at proof reading has been made to minimize errors.

## 2022-06-11 ENCOUNTER — Ambulatory Visit: Payer: No Typology Code available for payment source | Admitting: Physical Therapy

## 2022-06-11 ENCOUNTER — Encounter (HOSPITAL_BASED_OUTPATIENT_CLINIC_OR_DEPARTMENT_OTHER): Payer: No Typology Code available for payment source | Admitting: Orthopaedic Surgery

## 2022-06-13 ENCOUNTER — Ambulatory Visit: Payer: No Typology Code available for payment source

## 2022-06-15 ENCOUNTER — Ambulatory Visit: Payer: No Typology Code available for payment source

## 2022-06-18 ENCOUNTER — Ambulatory Visit: Payer: No Typology Code available for payment source | Attending: Orthopaedic Surgery

## 2022-06-18 ENCOUNTER — Ambulatory Visit: Payer: No Typology Code available for payment source

## 2022-06-18 DIAGNOSIS — M25562 Pain in left knee: Secondary | ICD-10-CM | POA: Diagnosis present

## 2022-06-18 DIAGNOSIS — R262 Difficulty in walking, not elsewhere classified: Secondary | ICD-10-CM | POA: Diagnosis present

## 2022-06-18 DIAGNOSIS — M6281 Muscle weakness (generalized): Secondary | ICD-10-CM | POA: Insufficient documentation

## 2022-06-18 DIAGNOSIS — M25662 Stiffness of left knee, not elsewhere classified: Secondary | ICD-10-CM | POA: Diagnosis present

## 2022-06-18 DIAGNOSIS — R2689 Other abnormalities of gait and mobility: Secondary | ICD-10-CM | POA: Diagnosis present

## 2022-06-18 DIAGNOSIS — R600 Localized edema: Secondary | ICD-10-CM | POA: Diagnosis present

## 2022-06-18 NOTE — Therapy (Signed)
OUTPATIENT PHYSICAL THERAPY TREATMENT NOTE  Patient Name: Dustin Parks MRN: [id number] DOB:Mar 13, 2001, 21 y.o., male Today's Date: 06/18/2022  PCP: Haydee Salter, MD REFERRING PROVIDER: Dr Donivan Scull    PT End of Session - 06/18/22 1504     Visit Number [id number]    Authorization Type [place] Time Period 03/19/2022 - 06/19/2022    Authorization - Visit Number 14    Authorization - Number of Visits 16    PT Start Time 1503    PT Stop Time 1545    PT Time Calculation (min) 42 min    Activity Tolerance Patient tolerated treatment well    Behavior During Therapy [facility] for tasks assessed/performed             Past Medical History:  Diagnosis Date   Allergy    Asthma    out grown now    PONV (postoperative nausea and vomiting)    Past Surgical History:  Procedure Laterality Date   KNEE ARTHROSCOPY Left 02/02/2022   Procedure: LEFT ARTHROSCOPY KNEE LYSIS OF ADHESION AND MANIPULATION UNDER ANESTHESIA;  Surgeon: [name], MD;  Location: [place];  Service: Orthopedics;  Laterality: Left;   KNEE ARTHROSCOPY WITH ANTERIOR CRUCIATE LIGAMENT (ACL) REPAIR WITH HAMSTRING GRAFT Left 11/08/2021   Procedure: LEFT KNEE ANTERIOR CRUCIATE LIGAMENT RECONSTRUCTION WITH QUADRICEPS TENDON AUTOGRAFT;  Surgeon: [name], MD;  Location: [place];  Service: Orthopedics;  Laterality: Left;   KNEE ARTHROSCOPY WITH ANTERIOR CRUCIATE LIGAMENT (ACL) REPAIR WITH HAMSTRING GRAFT Left 04/30/2022   Procedure: LEFT KNEE ARTHROSCOPY WITH ANTERIOR CRUCIATE LIGAMENT (ACL) REPAIR WITH QUADRICEPS ALLOGRAFT AND LATERAL MENISCAL REPAIR;  Surgeon: [name], MD;  Location: [place];  Service: Orthopedics;  Laterality: Left;   KNEE ARTHROSCOPY WITH MENISCAL REPAIR  11/08/2021   Procedure: LEFT LATERAL MENISCAL REPAIR;  Surgeon: [name], MD;  Location: [place];  Service: Orthopedics;;   [place] EXTRACTION  2022   Patient  Active Problem List   Diagnosis Date Noted   Peripheral tear of lateral meniscus of left knee as current injury    Arthrofibrosis of knee joint, left 01/31/2022   S/P repair of anterior cruciate ligament 01/31/2022   Morbid obesity with BMI of 40.0-44.9, adult ([place]) 01/31/2022   Hypertriglyceridemia 01/31/2022   Angioedema of lips- [place] 12/13/2021   Tear of anterior cruciate ligament graft ([place])    Complex tear of lateral meniscus of left knee as current injury    History of orchitis 02/08/2021     PCP: Libby Maw, MD   REFERRING PROVIDER: [name], MD   REFERRING DIAG:  [phone] (ICD-10-CM) - Rupture of anterior cruciate ligament of left knee, initial encounter  S83.272A (ICD-10-CM) - Complex tear of lateral meniscus of left knee as current injury, initial encounter Z98.890 (ICD-10-CM) - S/P left knee arthroscopy    THERAPY DIAG:  Left knee pain, unspecified chronicity   Stiffness of left knee, not elsewhere classified   Muscle weakness (generalized)   Difficulty in walking, not elsewhere classified    PERTINENT HISTORY: 11/08/2021 Left knee ACL reconstruction with quadriceps autograft and lateral meniscal repair.    02/02/2022: Lt knee lysis of adheresion and manipulation under anesthesia  Allergy to certain adhesives including tegaderm per pt report   Re-rupture of Lt ACL and Lt meniscal tear with subsequent repair on 05/02/2022   ONSET DATE: Injury 10/09/2021 / Surgery 11/08/2021; subsequent Lt ACL and meniscus tear and repair on 05/02/2022  SUBJECTIVE: Patient reports no current pain. Reports fall a couple weeks ago, followed up with MD, has had no pain and no increase in swelling.  PAIN:  Are you having pain? No NPRS scale: 0/10 at rest Pain location: Left knee  Pain orientation: Left PAIN TYPE: Aching Pain description: intermittent  Aggravating factors: use of the kne Relieving factors: rest     PRECAUTIONS: Other: per Dr. Eddie Dibbles ACL  reconstruction with meniscal repair   WEIGHT BEARING RESTRICTIONS Yes   PLOF: Independent; Pt was able to perform all of his ADLs/IADLs and functional mobility skills without difficulty or limitations.  Pt was able to perform work activities.  Pt ambulated without an AD with good stability without difficulty.    PATIENT GOALS improved stability with leaning, to have the ability to run, to be able to bowl.  Return to PLOF.      OBJECTIVE:  *Unless otherwise noted, objective measures collected previously*  LE ROM:  A/PROM Left 02/03/2022 Left 05/02/2022 Left 05/04/2022 Left 05/07/2022 Left 05/14/2022  Knee flexion 93 AROM/ 97 AAROM/ 103PROM/ 106 PROM following MET 78 AROM, 81 AAROM with strap 82 AROM, 86 AAROM with strap 90 deg AROM heel slide 100 deg  Knee extension -1 AROM/ 0 PROM -3 AROM  4 hyper    (Blank rows = not tested)  LE MMT:  MMT Right  Left  Left 03/08/2022  Hip flexion  4+/5 5/5  Hip extension     Hip abduction     Knee flexion  5/5 5/5  Knee extension  4+/5 5/5  Ankle dorsiflexion  5/5   Ankle plantarflexion  5/5   Ankle inversion     Ankle eversion      (Blank rows = not tested)   FUNCTIONAL TESTS:  -Dead lift: Good form and depth with no pain    TODAY'S TREATMENT: [facility] Adult PT Treatment:                                                DATE: 06/18/2022 AROM 2-128  Therapeutic Exercise: Recumbent bike L3 x 6 min lowering seat at 3 min to promote knee flexion Hamstring stretch with strap 2 x 30 sec supine Quad stretch 2 x30 with strap in prone Hamstring curl with hip extension 2 x 15 with 6# L hip abduction 1 set going to fatigue with 6# Step down with LLE using 4 inch step to avoid going beyond precaution x10 SLR 2x15 Heel raise off edge of step 2 x 20  [facility] Adult PT Treatment:                                                DATE: 06/04/2022 AROM flexion 122  Therapeutic Exercise: Recumbent bike L3 x 6 min lowering seat at 3 min to promote knee  flexion Hamstring stretch with strap 2 x 30 sec supine Quad stretch 2 x30 with strap in prone Hamstring curl with hip extension 2 x 15 with 6# L hip abduction 1 set going to fatigue with 6# Backward walking on treadmil x 2 x 2 min Step down with LLE using 4 inch step to avoid going beyoned precaution  [place] Adult PT Treatment:  DATE: 05/28/2022 AROM 2 - 112  Therapeutic Exercise: Recumbent bike L3 x 6 min lowering seat at 3 min to promote knee flexion Quad stretch 2 x 30 sec in thomas test pos Wall squat 2 x 12 Heel raise off edge of step 2 x 20 Step up on to 4 inch step 2 x 10 Manual Therapy: Tibiofemoral mobs grade IV AP to promote MTPR along the rectus femoris   [facility] Adult PT Treatment:                                                DATE: 05/21/2022 Total arc AROM  8 - 106  Therapeutic Exercise: Quad set combined with [place] x 5  min with heel propped SLR with quad set lifting 6 inch off table x 5 min Prone knee flexion hip with hip extension 2 x 15 Recumbent bike x 5 min full revolutions Standing TKE 1 x 10  Manual Therapy: Tibiofemoral mobs grade IV AP to promote Neuromuscular re-ed: [place] quad activation with LLE advanced rocking forward abd backward combined with ankle DF 1 x 12 Modalities: Russian x 10 min 10/10, duration 300, frequency PPS, Ramp 2.0     PATIENT EDUCATION: Education details: HEP, continued focus on quad activation and performing SLR without lag, gradual progression of knee flexion Person educated: Patient Education method: Explanation, Demonstration, Tactile cues, Verbal cues Education comprehension: verbalized understanding, returned demonstration, verbal cues required, tactile cues required, and needs further education   HOME EXERCISE PROGRAM: Access Code: [email or web address]    ASSESSMENT: CLINICAL IMPRESSION: Patient presents to with no current pain in his Lt knee and reports he recently fell in the  mud but has followed up with his MD, and has had no increased pain or swelling.  Session today continued to focus on quad and hamstring strengthening as well as stretching for quads. Achieved 2-128 ROM today. Patient was able to tolerate all prescribed exercises with no adverse effects. Patient continues to benefit from skilled PT services and should be progressed as able to improve functional independence.    GOALS: SHORT TERM GOALS:   STG Name Target Date Goal status  1 Pt will be independent and compliant with HEP for improved pain, strength, and ROM.  Baseline: New HEP administered 05/02/2022 05/10/2022: Achieved 06/13/2022 ACHIEVED  2 Pt will demo a good quad set and perform supine SLRs independently with brace for improved quad strength. Baseline: Pain with quad setting/ SLR 05/10/2022: achieved 06/13/2022 ACHIEVED  3 Pt will demo improved improved L knee extension PROM/AROM to 0/3 and flexion PROM to 60 deg for improved mobility and stiffness. Baseline: -3 to 78 degrees AROM 06/13/2022 INITIAL (updated)  4 Pt will demo improved improved L knee extension AROM to 0 deg and  flexion AAROM/PROM to 80/90 deg for improved mobility and stiffness. Baseline: -3 to 78 degrees AROM 05/10/2022: 0-90 degrees 06/13/2022 ACHIEVED  5 Pt will demo L knee AROM to be 0 - 120 deg for improved stiffness and performance of functional mobility.   Baseline: -3 to 78 degrees AROM  06/13/2022 INITIAL (Updated)  6 Pt will progress Wb'ing with gait per MD orders and protocol without adverse effects.  Baseline: 06/13/2022 INITIAL (updated)  7 Pt will progress with closed chain exercises per protocol without adverse effects for improved functional strength and stability.  Baseline: 06/13/2022 INITIAL (updated)  8 Pt will  score 0-1 on the lateral step down test on a 4 inch step for improved quad eccentric control.   06/13/2022   INITIAL (updated)  9 Pt will score 0-1 on the lateral step down test on a 6 inch step for improved quad  eccentric control and performance of stairs.   06/13/2022 INITIAL (updated)  10 Pt will ambulate with a normalized heel to toe gait without limping without AD Baseline: Pt ambulates with swing-through gait with axillary crutches 06/13/2022 INITIAL (updated)    LONG TERM GOALS:    LTG Name Target Date Goal status  1 Pt will be able to perform his ADLs/IADLs and normal functional mobility skills without significant difficulty or pain.  Baseline: Pt reports some moderate difficulty with getting into bed and crouching at work 08/01/2022   INITIAL (updated)  2 Pt will ambulate extended community distance without an AD without increased pain or difficulty.  Baseline: Unable to ambulate without crutches 08/01/2022 INITIAL (updated)  3 Pt will be able to perform his normal daily transfers without difficulty.  Baseline: moderate difficulty 08/01/2022 INITIAL (updated)  4 Pt will demo good squatting form, symmetrical Wb'ing, and good knee alignment without significant pain for improved functional strength and tolerance with IADLs.  Baseline: Unable to perform post-op 08/01/2022 INITIAL (updated)  5 Pt will be able to perform stairs with a reciprocal gait without the rail with good control.  Baseline: Unable to perform post-op, uses crutches 08/01/2022 INITIAL (updated)  6 Pt will be able to perform all of his school activities and work activities without significant pain or difficulty.   Baseline: unable 08/01/2022 INITIAL (updated)  7 Pt will demo 5/5 L hip and knee strength for improved tolerance with and performance of functional mobility skills and to assist in returning to PLOF.   Baseline: 08/01/2022 INITIAL (updated)     PLAN: PT FREQUENCY: 2-3x/week   PT DURATION: other: 12 weeks   PLANNED INTERVENTIONS: Therapeutic exercises, Therapeutic activity, Neuro Muscular re-education, Balance training, Gait training, Patient/Family education, Joint mobilization, Stair training, DME instructions, Aquatic  Therapy, Electrical stimulation, Cryotherapy, Moist heat, Taping, and Manual therapy   PLAN FOR NEXT SESSION: Quad activation and progression of active hyperextension, continue gradual progression of knee flexion, gait training, gradually progress closed chain strengthening as tolerated   Margarette Canada, PTA 06/18/22 3:46 PM

## 2022-06-20 ENCOUNTER — Ambulatory Visit: Payer: No Typology Code available for payment source

## 2022-06-22 ENCOUNTER — Encounter: Payer: Self-pay | Admitting: Physical Therapy

## 2022-06-25 ENCOUNTER — Ambulatory Visit: Payer: No Typology Code available for payment source | Admitting: Physical Therapy

## 2022-06-27 ENCOUNTER — Ambulatory Visit: Payer: No Typology Code available for payment source | Admitting: Physical Therapy

## 2022-06-27 ENCOUNTER — Encounter: Payer: Self-pay | Admitting: Physical Therapy

## 2022-06-27 DIAGNOSIS — M25562 Pain in left knee: Secondary | ICD-10-CM | POA: Diagnosis not present

## 2022-06-27 DIAGNOSIS — M6281 Muscle weakness (generalized): Secondary | ICD-10-CM

## 2022-06-27 DIAGNOSIS — M25662 Stiffness of left knee, not elsewhere classified: Secondary | ICD-10-CM

## 2022-06-27 NOTE — Therapy (Signed)
OUTPATIENT PHYSICAL THERAPY TREATMENT NOTE  Patient Name: Dustin Parks MRN: [id number] DOB:[birth year], 21 y.o., male 41 Date: 06/27/2022  PCP: Haydee Salter, MD REFERRING PROVIDER: Dr Donivan Scull    PT End of Session - 06/27/22 0932     Visit Number 88    Number of Visits 33    Date for PT Re-Evaluation 08/01/22    Authorization Type [facility] [employer]    Authorization Time Period 03/19/2022 - 06/19/2022    Authorization - Visit Number 15    Authorization - Number of Visits 16    PT Start Time 0931    PT Stop Time 1015    PT Time Calculation (min) 44 min    Activity Tolerance Patient tolerated treatment well              Past Medical History:  Diagnosis Date   Allergy    Asthma    out grown now    PONV (postoperative nausea and vomiting)    Past Surgical History:  Procedure Laterality Date   KNEE ARTHROSCOPY Left 02/02/2022   Procedure: LEFT ARTHROSCOPY KNEE LYSIS OF ADHESION AND MANIPULATION UNDER ANESTHESIA;  Surgeon: [name], MD;  Location: [place];  Service: Orthopedics;  Laterality: Left;   KNEE ARTHROSCOPY WITH ANTERIOR CRUCIATE LIGAMENT (ACL) REPAIR WITH HAMSTRING GRAFT Left 11/08/2021   Procedure: LEFT KNEE ANTERIOR CRUCIATE LIGAMENT RECONSTRUCTION WITH QUADRICEPS TENDON AUTOGRAFT;  Surgeon: [name], MD;  Location: [place];  Service: Orthopedics;  Laterality: Left;   KNEE ARTHROSCOPY WITH ANTERIOR CRUCIATE LIGAMENT (ACL) REPAIR WITH HAMSTRING GRAFT Left 04/30/2022   Procedure: LEFT KNEE ARTHROSCOPY WITH ANTERIOR CRUCIATE LIGAMENT (ACL) REPAIR WITH QUADRICEPS ALLOGRAFT AND LATERAL MENISCAL REPAIR;  Surgeon: [name], MD;  Location: [place];  Service: Orthopedics;  Laterality: Left;   KNEE ARTHROSCOPY WITH MENISCAL REPAIR  11/08/2021   Procedure: LEFT LATERAL MENISCAL REPAIR;  Surgeon: [name], MD;  Location: [place];  Service: Orthopedics;;   [place] EXTRACTION  2022    Patient Active Problem List   Diagnosis Date Noted   Peripheral tear of lateral meniscus of left knee as current injury    Arthrofibrosis of knee joint, left 01/31/2022   S/P repair of anterior cruciate ligament 01/31/2022   Morbid obesity with BMI of 40.0-44.9, adult ([place]) 01/31/2022   Hypertriglyceridemia 01/31/2022   Angioedema of lips- [place] 12/13/2021   Tear of anterior cruciate ligament graft ([place])    Complex tear of lateral meniscus of left knee as current injury    History of orchitis 02/08/2021     PCP: Libby Maw, MD   REFERRING PROVIDER: [name], MD   REFERRING DIAG:  [phone] (ICD-10-CM) - Rupture of anterior cruciate ligament of left knee, initial encounter  S83.272A (ICD-10-CM) - Complex tear of lateral meniscus of left knee as current injury, initial encounter Z98.890 (ICD-10-CM) - S/P left knee arthroscopy    THERAPY DIAG:  Left knee pain, unspecified chronicity   Stiffness of left knee, not elsewhere classified   Muscle weakness (generalized)   Difficulty in walking, not elsewhere classified    PERTINENT HISTORY: 11/08/2021 Left knee ACL reconstruction with quadriceps autograft and lateral meniscal repair.    02/02/2022: Lt knee lysis of adheresion and manipulation under anesthesia  Allergy to certain adhesives including tegaderm per pt report   Re-rupture of Lt ACL and Lt meniscal tear with subsequent repair on 05/02/2022   ONSET DATE: Injury 10/09/2021 / Surgery 11/08/2021; subsequent Lt ACL and meniscus  tear and repair on 05/02/2022     SUBJECTIVE: "I am doing pretty good. I did notice some rubbing of the brace on the inside of my knee and noticed a area with pressure which felt better after I moved my brace. 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 No NPRS scale: 0/10 at rest Pain location: Left knee  Pain orientation:  Left PAIN TYPE: Aching Pain description: intermittent  Aggravating factors: use of the kne Relieving factors: rest     PRECAUTIONS: Other: per Dr. Eddie Dibbles ACL reconstruction with meniscal repair   WEIGHT BEARING RESTRICTIONS Yes   PLOF: Independent; Pt was able to perform all of his ADLs/IADLs and functional mobility skills without difficulty or limitations.  Pt was able to perform work activities.  Pt ambulated without an AD with good stability without difficulty.    PATIENT GOALS improved stability with leaning, to have the ability to run, to be able to bowl.  Return to PLOF.      OBJECTIVE:  *Unless otherwise noted, objective measures collected previously*  LE ROM:  A/PROM Left 02/03/2022 Left 05/02/2022 Left 05/04/2022 Left 05/07/2022 Left 05/14/2022  Knee flexion 93 AROM/ 97 AAROM/ 103PROM/ 106 PROM following MET 78 AROM, 81 AAROM with strap 82 AROM, 86 AAROM with strap 90 deg AROM heel slide 100 deg  Knee extension -1 AROM/ 0 PROM -3 AROM  4 hyper    (Blank rows = not tested)  LE MMT:  MMT Right  Left  Left 03/08/2022  Hip flexion  4+/5 5/5  Hip extension     Hip abduction     Knee flexion  5/5 5/5  Knee extension  4+/5 5/5  Ankle dorsiflexion  5/5   Ankle plantarflexion  5/5   Ankle inversion     Ankle eversion      (Blank rows = not tested)   FUNCTIONAL TESTS:  -Dead lift: Good form and depth with no pain    TODAY'S TREATMENT: [facility] Adult PT Treatment:                                                DATE: 06/27/2022 Therapeutic Exercise: Nu-step L10 x 6 min LE only Standing hamstring stretch 2 x 30 sec Standing quad stretch 2 x 30 sec Slant board calf stretch 2 x 30 sec LLE single leg press 2 x 12 with 40# (avoiding 90 degrees of flexion per restrictions), 1 x 15 set con bil / ecc LLE 60# Standing hip abduction on cybex 2 x 15 12.5# Bridge with heels on black foam roller 2 x 12 Step up 6 inch with LLE x 20  [facility] Adult PT Treatment:                                                 DATE: 06/18/2022 AROM 2-128  Therapeutic Exercise: Recumbent bike L3 x 6 min lowering seat at 3 min to promote knee flexion Hamstring stretch with strap 2 x 30 sec supine Quad stretch 2 x30 with strap in prone Hamstring curl with hip extension 2 x 15 with 6# L hip abduction  1 set going to fatigue with 6# Step down with LLE using 4 inch step to avoid going beyond precaution x10 SLR 2x15 Heel raise off edge of step 2 x 20  [facility] Adult PT Treatment:                                                DATE: 06/04/2022 AROM flexion 122  Therapeutic Exercise: Recumbent bike L3 x 6 min lowering seat at 3 min to promote knee flexion Hamstring stretch with strap 2 x 30 sec supine Quad stretch 2 x30 with strap in prone Hamstring curl with hip extension 2 x 15 with 6# L hip abduction 1 set going to fatigue with 6# Backward walking on treadmil x 2 x 2 min Step down with LLE using 4 inch step to avoid going beyoned precaution    PATIENT EDUCATION: Education details: HEP, continued focus on quad activation and performing SLR without lag, gradual progression of knee flexion Person educated: Patient Education method: Explanation, Demonstration, Tactile cues, Verbal cues Education comprehension: verbalized understanding, returned demonstration, verbal cues required, tactile cues required, and needs further education   HOME EXERCISE PROGRAM: Access Code: [email or web address]    ASSESSMENT: CLINICAL IMPRESSION: Kal reports to PT today with no pain but does report some aggrivation along the medial aspect of the L knee likely as a result of malpositioning of his brace. Due to his ROM continues to be [facility] focused session on gross hip/ knee strengthening staying within the restriction of the MD protocol. End of session he noted no increase in pain.    GOALS: SHORT TERM GOALS:   STG Name Target Date Goal status  1 Pt will be independent and compliant with HEP for improved pain, strength, and  ROM.  Baseline: New HEP administered 05/02/2022 05/10/2022: Achieved 06/13/2022 ACHIEVED  2 Pt will demo a good quad set and perform supine SLRs independently with brace for improved quad strength. Baseline: Pain with quad setting/ SLR 05/10/2022: achieved 06/13/2022 ACHIEVED  3 Pt will demo improved improved L knee extension PROM/AROM to 0/3 and flexion PROM to 60 deg for improved mobility and stiffness. Baseline: -3 to 78 degrees AROM 06/13/2022 INITIAL (updated)  4 Pt will demo improved improved L knee extension AROM to 0 deg and  flexion AAROM/PROM to 80/90 deg for improved mobility and stiffness. Baseline: -3 to 78 degrees AROM 05/10/2022: 0-90 degrees 06/13/2022 ACHIEVED  5 Pt will demo L knee AROM to be 0 - 120 deg for improved stiffness and performance of functional mobility.   Baseline: -3 to 78 degrees AROM  06/13/2022 INITIAL (Updated)  6 Pt will progress Wb'ing with gait per MD orders and protocol without adverse effects.  Baseline: 06/13/2022 INITIAL (updated)  7 Pt will progress with closed chain exercises per protocol without adverse effects for improved functional strength and stability.  Baseline: 06/13/2022 INITIAL (updated)  8 Pt will score 0-1 on the lateral step down test on a 4 inch step for improved quad eccentric control.   06/13/2022   INITIAL (updated)  9 Pt will score 0-1 on the lateral step down test on a 6 inch step for improved quad eccentric control and performance of stairs.   06/13/2022 INITIAL (updated)  10 Pt will ambulate with a normalized heel to toe gait without limping without AD Baseline: Pt ambulates with swing-through gait with axillary crutches 06/13/2022  INITIAL (updated)    LONG TERM GOALS:    LTG Name Target Date Goal status  1 Pt will be able to perform his ADLs/IADLs and normal functional mobility skills without significant difficulty or pain.  Baseline: Pt reports some moderate difficulty with getting into bed and crouching at work 08/01/2022   INITIAL (updated)  2  Pt will ambulate extended community distance without an AD without increased pain or difficulty.  Baseline: Unable to ambulate without crutches 08/01/2022 INITIAL (updated)  3 Pt will be able to perform his normal daily transfers without difficulty.  Baseline: moderate difficulty 08/01/2022 INITIAL (updated)  4 Pt will demo good squatting form, symmetrical Wb'ing, and good knee alignment without significant pain for improved functional strength and tolerance with IADLs.  Baseline: Unable to perform post-op 08/01/2022 INITIAL (updated)  5 Pt will be able to perform stairs with a reciprocal gait without the rail with good control.  Baseline: Unable to perform post-op, uses crutches 08/01/2022 INITIAL (updated)  6 Pt will be able to perform all of his school activities and work activities without significant pain or difficulty.   Baseline: unable 08/01/2022 INITIAL (updated)  7 Pt will demo 5/5 L hip and knee strength for improved tolerance with and performance of functional mobility skills and to assist in returning to PLOF.   Baseline: 08/01/2022 INITIAL (updated)     PLAN: PT FREQUENCY: 2-3x/week   PT DURATION: other: 12 weeks   PLANNED INTERVENTIONS: Therapeutic exercises, Therapeutic activity, Neuro Muscular re-education, Balance training, Gait training, Patient/Family education, Joint mobilization, Stair training, DME instructions, Aquatic Therapy, Electrical stimulation, Cryotherapy, Moist heat, Taping, and Manual therapy   PLAN FOR NEXT SESSION:  Re-vamp HEP since dropping to 1 x a week. Quad activation and progression of active hyperextension, continue gradual progression of knee flexion, gait training, gradually progress closed chain strengthening as tolerated   Breslin Burklow PT, DPT, LAT, ATC  06/27/22  10:17 AM

## 2022-06-28 NOTE — Progress Notes (Signed)
[facility] Behavioral Health Counselor Initial Adult Exam  Name: Dustin Parks Date: 06/28/2022 MRN: [id number] DOB: 24-Mar-2001 PCP: Loyola Mast, MD  Time spent: 60 min on telephone w/audio only; Pt is home in private/[facility]  Guardian/Payee:  Self    Paperwork requested:  Not currently needed  Reason for Visit /Presenting Problem: Pt is anx/dep re: his efforts for Sole Px & Legal Custody of his 69 mos old Son Avriel w/male Partner Carmela who is 20yo. Pt's verbal rental trailer agreement is w/his Maternal Gfr.   Pt wants to better understand himself, his, "inability to argue" & possible naivete towards his Son's Mother", & "ways I can defend myself against her manipulating". Pt notes he bcms psychologically overwhelmed w/Partner's beh as he "recognizes the familiar beh w/in his own Parent's marriage" & "how I was treated then".  Mental Status Exam: Appearance:   NA     Behavior:  Sharing, Assertive, and modest  Motor:  NA  Speech/Language:   Normal Rate  Affect:  Appropriate  Mood:  anxious and depressed  Thought process:  goal directed  Thought content:    WNL  Sensory/Perceptual disturbances:    WNL  Orientation:  oriented to person, place, and time/date  Attention:  Good  Concentration:  Good  Memory:  WNL  Fund of knowledge:   Good  Insight:    Fair  Judgment:   Fair  Impulse Control:  Good    Reported Symptoms:  Pt describes issues w/self-understanding as he has been triggered by Partner's beh which he presents as, "extreme mood swings that are bad & switching of mood that happens quickly". Pt notes he is often blamed & is confused by her daily.  Risk Assessment: Danger to Self:  No Self-injurious Behavior: No Danger to Others: No Duty to Warn:no Physical Aggression / Violence:No  Access to Firearms a concern: No  Gang Involvement:No  Patient / guardian was educated about steps to take if suicide or homicide risk level increases  between visits: n/a While future psychiatric events cannot be accurately predicted, the patient does not currently require acute inpatient psychiatric care and does not currently meet [facility] involuntary commitment criteria.  Substance Abuse History: Current substance abuse: No     Past Psychiatric History:   No previous psychological problems have been observed Outpatient Providers: Dr. Dyane Dustman, MD @ [facility] in [place] History of Psych Hospitalization: No  Psychological Testing:  NA    Abuse History:  Victim of: No.,  NA    Report needed: No. Victim of Neglect:No. Perpetrator of  NA   Witness / Exposure to Domestic Violence: No   Protective Services Involvement: No  Witness to [employer] Violence:  No   Family History:  Family History  Problem Relation Age of Onset   Obesity Mother    Hyperlipidemia Father    Obesity Father    Obesity Maternal Grandmother    Stroke Maternal Grandfather    Diabetes Paternal Grandmother     Living situation: the patient lives with their family  Sexual Orientation: Straight  Relationship Status: single  Name of spouse / other: Sig Other Carmela & Pt are in parental conflict If a parent, number of children / ages:6 mos old Son Avriel  Support Systems: significant other parents [place] Parents @ times  Financial Stress:   Not mentioned today  Income/Employment/Disability: Employment; 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[place], [place] & graduates in 3 wks fom  [employer]  Military Service: No   Educational History: Education: Water quality scientist: Not noted today  Any cultural differences that may affect / interfere with treatment:  family role identities  and lack of belief in vaccinations for Son Avi by male Partner's Family  Recreation/Hobbies: not noted today  Stressors: Legal issue   Loss of Pt's belief in his ability to manage relational issues w/his Son's Mother & her  Family   Marital or family conflict    Strengths: Family, Friends, Journalist, newspaper, and Able to Communicate Effectively  Barriers:  Possibility of legal issues due to Custody & Co-Parenting concerns   Legal History: Pending legal issue / charges:  Pt is consulting an Atty on 06/25/2022 due to Custody issues w/Partner. History of legal issue / charges:  NA  Medical History/Surgical History: reviewed Past Medical History:  Diagnosis Date   Allergy    Asthma    out grown now    PONV (postoperative nausea and vomiting)     Past Surgical History:  Procedure Laterality Date   KNEE ARTHROSCOPY Left 02/02/2022   Procedure: LEFT ARTHROSCOPY KNEE LYSIS OF ADHESION AND MANIPULATION UNDER ANESTHESIA;  Surgeon: Huel Cote, MD;  Location: [facility];  Service: Orthopedics;  Laterality: Left;   KNEE ARTHROSCOPY WITH ANTERIOR CRUCIATE LIGAMENT (ACL) REPAIR WITH HAMSTRING GRAFT Left 11/08/2021   Procedure: LEFT KNEE ANTERIOR CRUCIATE LIGAMENT RECONSTRUCTION WITH QUADRICEPS TENDON AUTOGRAFT;  Surgeon: Huel Cote, MD;  Location: [facility];  Service: Orthopedics;  Laterality: Left;   KNEE ARTHROSCOPY WITH ANTERIOR CRUCIATE LIGAMENT (ACL) REPAIR WITH HAMSTRING GRAFT Left 04/30/2022   Procedure: LEFT KNEE ARTHROSCOPY WITH ANTERIOR CRUCIATE LIGAMENT (ACL) REPAIR WITH QUADRICEPS ALLOGRAFT AND LATERAL MENISCAL REPAIR;  Surgeon: Huel Cote, MD;  Location: [facility];  Service: Orthopedics;  Laterality: Left;   KNEE ARTHROSCOPY WITH MENISCAL REPAIR  11/08/2021   Procedure: LEFT LATERAL MENISCAL REPAIR;  Surgeon: Huel Cote, MD;  Location:  SUR[facility]ERY CENTER;  Service: Orthopedics;;   WISDOM TOOTH EXTRACTION  2022    Medications: Current Outpatient Medications  Medication Sig Dispense Refill   acetaminophen (TYLENOL) 500 MG tablet Take 500 mg by mouth every 6 (six) hours as needed.     aspirin EC 325 MG tablet Take 1 tablet (325 mg total) by mouth daily. 30 tablet  0   EPINEPHrine 0.3 mg/0.3 mL IJ SOAJ injection Inject 0.3 mg into the muscle as needed for anaphylaxis. 2 each 1   ibuprofen (ADVIL) 800 MG tablet Take 800 mg by mouth every 8 (eight) hours as needed.     No current facility-administered medications for this visit.    Allergies  Allergen Reactions   Other Swelling    All citrus fruit with the exception of lime    Diagnoses:  Adjustment disorder with mixed anxiety and depressed mood  Plan of Care: Pt will r/s once he is past Graduation & his Atty consult   Deneise Lever, LMFT

## 2022-07-02 ENCOUNTER — Ambulatory Visit: Payer: No Typology Code available for payment source | Admitting: Physical Therapy

## 2022-07-02 ENCOUNTER — Other Ambulatory Visit: Payer: Self-pay

## 2022-07-02 ENCOUNTER — Encounter: Payer: Self-pay | Admitting: Physical Therapy

## 2022-07-02 DIAGNOSIS — M25562 Pain in left knee: Secondary | ICD-10-CM | POA: Diagnosis not present

## 2022-07-02 DIAGNOSIS — M25662 Stiffness of left knee, not elsewhere classified: Secondary | ICD-10-CM

## 2022-07-02 DIAGNOSIS — R262 Difficulty in walking, not elsewhere classified: Secondary | ICD-10-CM

## 2022-07-02 DIAGNOSIS — M6281 Muscle weakness (generalized): Secondary | ICD-10-CM

## 2022-07-02 NOTE — Patient Instructions (Addendum)
Access Code: [email or web address] URL: [email or web address] Date: 07/02/2022 Prepared by: Rosana Hoes  Exercises - Active Straight Leg Raise with Quad Set  - 1 x daily - 3-4 x weekly - 3 sets - 10 reps - Bridge  - 1 x daily - 3-4 x weekly - 3 sets - 10 reps - 5 seconds hold - Sidelying Hip Abduction  - 3-4 x weekly - 3 sets - 15 reps - Prone Knee Flexion  - 1 x daily - 3-4 x weekly - 3 sets - 15 reps - Wall Squat  - 1 x daily - 3-4 x weekly - 2-3 sets - 10 reps - Standing Terminal Knee Extension with Resistance  - 1 x daily - 3-4 x weekly - 3 sets - 10 reps - Tandem Stance  - 1 x daily - 3-4 x weekly - 3 reps - 30 seconds hold - Single Leg Stance  - 1 x daily - 3-4 x weekly - 3 reps - 30 seconds hold

## 2022-07-02 NOTE — Therapy (Signed)
OUTPATIENT PHYSICAL THERAPY TREATMENT NOTE  Patient Name: Dustin Parks MRN: [id number] DOB:Apr 30, 2001, 21 y.o., male 26 Date: 07/02/2022  PCP: Haydee Salter, MD REFERRING PROVIDER: Dr Donivan Scull    PT End of Session - 07/02/22 1723     Visit Number 50    Number of Visits 44    Date for PT Re-Evaluation 08/01/22    Authorization Type [facility]    Authorization Time Period 03/19/2022 - 06/19/2022    Authorization - Visit Number 60    Authorization - Number of Visits 16    PT Start Time 6979    PT Stop Time 1700    PT Time Calculation (min) 45 min    Equipment Utilized During Treatment Left knee immobilizer    Activity Tolerance Patient tolerated treatment well    Behavior During Therapy WFL for tasks assessed/performed               Past Medical History:  Diagnosis Date   Allergy    Asthma    out grown now    PONV (postoperative nausea and vomiting)    Past Surgical History:  Procedure Laterality Date   KNEE ARTHROSCOPY Left 02/02/2022   Procedure: LEFT ARTHROSCOPY KNEE LYSIS OF ADHESION AND MANIPULATION UNDER ANESTHESIA;  Surgeon: [name], MD;  Location: [place];  Service: Orthopedics;  Laterality: Left;   KNEE ARTHROSCOPY WITH ANTERIOR CRUCIATE LIGAMENT (ACL) REPAIR WITH HAMSTRING GRAFT Left 11/08/2021   Procedure: LEFT KNEE ANTERIOR CRUCIATE LIGAMENT RECONSTRUCTION WITH QUADRICEPS TENDON AUTOGRAFT;  Surgeon: [name], MD;  Location: [place];  Service: Orthopedics;  Laterality: Left;   KNEE ARTHROSCOPY WITH ANTERIOR CRUCIATE LIGAMENT (ACL) REPAIR WITH HAMSTRING GRAFT Left 04/30/2022   Procedure: LEFT KNEE ARTHROSCOPY WITH ANTERIOR CRUCIATE LIGAMENT (ACL) REPAIR WITH QUADRICEPS ALLOGRAFT AND LATERAL MENISCAL REPAIR;  Surgeon: [name], MD;  Location: [place];  Service: Orthopedics;  Laterality: Left;   KNEE ARTHROSCOPY WITH MENISCAL REPAIR  11/08/2021   Procedure: LEFT LATERAL MENISCAL REPAIR;  Surgeon:  [name], MD;  Location: [place];  Service: Orthopedics;;   [place] EXTRACTION  2022   Patient Active Problem List   Diagnosis Date Noted   Peripheral tear of lateral meniscus of left knee as current injury    Arthrofibrosis of knee joint, left 01/31/2022   S/P repair of anterior cruciate ligament 01/31/2022   Morbid obesity with BMI of 40.0-44.9, adult ([place]) 01/31/2022   Hypertriglyceridemia 01/31/2022   Angioedema of lips- [place] 12/13/2021   Tear of anterior cruciate ligament graft ([place])    Complex tear of lateral meniscus of left knee as current injury    History of orchitis 02/08/2021     PCP: Libby Maw, MD   REFERRING PROVIDER: [name], MD   REFERRING DIAG:  [phone] (ICD-10-CM) - Rupture of anterior cruciate ligament of left knee, initial encounter  S83.272A (ICD-10-CM) - Complex tear of lateral meniscus of left knee as current injury, initial encounter Z98.890 (ICD-10-CM) - S/P left knee arthroscopy    THERAPY DIAG:  Left knee pain, unspecified chronicity   Stiffness of left knee, not elsewhere classified   Muscle weakness (generalized)   Difficulty in walking, not elsewhere classified    PERTINENT HISTORY: 11/08/2021 Left knee ACL reconstruction with quadriceps autograft and lateral meniscal repair.    02/02/2022: Lt knee lysis of adheresion and manipulation under anesthesia  Allergy to certain adhesives including tegaderm per pt report   Re-rupture of Lt ACL and Lt  meniscal tear with subsequent repair on 05/02/2022   ONSET DATE: Injury 10/09/2021 / Surgery 11/08/2021; subsequent Lt ACL and meniscus tear and repair on 05/02/2022     SUBJECTIVE: Patient reports he went to a Hispanic night club and was dancing all night.    PAIN:  Are you having pain? No NPRS scale: 0/10 at rest Pain location: Left knee  Pain orientation: Left PAIN TYPE: Aching Pain description: intermittent  Aggravating factors: use of the  kne Relieving factors: rest     PRECAUTIONS: Other: per Dr. Eddie Dibbles ACL reconstruction with meniscal repair   WEIGHT BEARING RESTRICTIONS Yes   PLOF: Independent; Pt was able to perform all of his ADLs/IADLs and functional mobility skills without difficulty or limitations.  Pt was able to perform work activities.  Pt ambulated without an AD with good stability without difficulty.    PATIENT GOALS improved stability with leaning, to have the ability to run, to be able to bowl.  Return to PLOF.      OBJECTIVE:  *Unless otherwise noted, objective measures collected previously*  LE ROM:  A/PROM Left 02/03/2022 Left 05/02/2022 Left 05/04/2022 Left 05/07/2022 Left 05/14/2022  Knee flexion 93 AROM/ 97 AAROM/ 103PROM/ 106 PROM following MET 78 AROM, 81 AAROM with strap 82 AROM, 86 AAROM with strap 90 deg AROM heel slide 100 deg  Knee extension -1 AROM/ 0 PROM -3 AROM  4 hyper    (Blank rows = not tested)  LE MMT:  MMT Right  Left  Left 03/08/2022  Hip flexion  4+/5 5/5  Hip extension     Hip abduction     Knee flexion  5/5 5/5  Knee extension  4+/5 5/5  Ankle dorsiflexion  5/5   Ankle plantarflexion  5/5   Ankle inversion     Ankle eversion      (Blank rows = not tested)   FUNCTIONAL TESTS:  -Dead lift: Good form and depth with no pain    TODAY'S TREATMENT: [facility] Adult PT Treatment:                                                DATE: 07/02/2022 Therapeutic Exercise: Recumbent bike L1 x 5 min while taking subjective SLR x 10 Squat to table with Airex 2 x 10 - used mirror for visual feedback on 2nd set Standing TKE with blue 2 x 10 Wall squat 2 x 10 Tandem stance 2 x 30 sec - left foot back Sidelying hip abduction 2 x 15 Bridge 2 x 10 with 3-5 sec hold Prone hamstring curl 2 x 15 SLS 2 x 30 sec   [facility] Adult PT Treatment:                                                DATE: 06/27/2022 Therapeutic Exercise: Nu-step L10 x 6 min LE only Standing hamstring stretch 2 x 30  sec Standing quad stretch 2 x 30 sec Slant board calf stretch 2 x 30 sec LLE single leg press 2 x 12 with 40# (avoiding 90 degrees of flexion per restrictions), 1 x 15 set con bil / ecc LLE 60# Standing hip abduction on cybex 2 x 15 12.5# Bridge with heels on black foam roller  2 x 12 Step up 6 inch with LLE x 20  [facility] Adult PT Treatment:                                                DATE: 06/18/2022 AROM 2-128 Therapeutic Exercise: Recumbent bike L3 x 6 min lowering seat at 3 min to promote knee flexion Hamstring stretch with strap 2 x 30 sec supine Quad stretch 2 x30 with strap in prone Hamstring curl with hip extension 2 x 15 with 6# L hip abduction 1 set going to fatigue with 6# Step down with LLE using 4 inch step to avoid going beyond precaution x10 SLR 2x15 Heel raise off edge of step 2 x 20    PATIENT EDUCATION: Education details: HEP Person educated: Patient Education method: Consulting civil engineer, Demonstration, Tactile cues, Verbal cues Education comprehension: verbalized understanding, returned demonstration, verbal cues required, tactile cues required, and needs further education   HOME EXERCISE PROGRAM: Access Code: [email or web address]    ASSESSMENT: CLINICAL IMPRESSION: Patient tolerated therapy well with no adverse effects. Therapy focused on progression of strengthening and endurance, and updating HEP to progress strengthening for home. He reports fatigue with all exercises but denies any pain. Patient instructed to continue avoid bending past 90 deg while weight bearing and use brace for standing exercises per his surgeons request. He required consistent cueing for exercise technique and with squatting to avoid weight shift. Patient would benefit from continued skilled PT to progress his strength and control in order to maximize functional ability.   GOALS: SHORT TERM GOALS:   STG Name Target Date Goal status  1 Pt will be independent and compliant with HEP for improved pain,  strength, and ROM.  Baseline: New HEP administered 05/02/2022 05/10/2022: Achieved 06/13/2022 ACHIEVED  2 Pt will demo a good quad set and perform supine SLRs independently with brace for improved quad strength. Baseline: Pain with quad setting/ SLR 05/10/2022: achieved 06/13/2022 ACHIEVED  3 Pt will demo improved improved L knee extension PROM/AROM to 0/3 and flexion PROM to 60 deg for improved mobility and stiffness. Baseline: -3 to 78 degrees AROM 06/13/2022 INITIAL (updated)  4 Pt will demo improved improved L knee extension AROM to 0 deg and  flexion AAROM/PROM to 80/90 deg for improved mobility and stiffness. Baseline: -3 to 78 degrees AROM 05/10/2022: 0-90 degrees 06/13/2022 ACHIEVED  5 Pt will demo L knee AROM to be 0 - 120 deg for improved stiffness and performance of functional mobility.   Baseline: -3 to 78 degrees AROM  06/13/2022 INITIAL (Updated)  6 Pt will progress Wb'ing with gait per MD orders and protocol without adverse effects.  Baseline: 06/13/2022 INITIAL (updated)  7 Pt will progress with closed chain exercises per protocol without adverse effects for improved functional strength and stability.  Baseline: 06/13/2022 INITIAL (updated)  8 Pt will score 0-1 on the lateral step down test on a 4 inch step for improved quad eccentric control.   06/13/2022   INITIAL (updated)  9 Pt will score 0-1 on the lateral step down test on a 6 inch step for improved quad eccentric control and performance of stairs.   06/13/2022 INITIAL (updated)  10 Pt will ambulate with a normalized heel to toe gait without limping without AD Baseline: Pt ambulates with swing-through gait with axillary crutches 06/13/2022 INITIAL (updated)    LONG TERM GOALS:  LTG Name Target Date Goal status  1 Pt will be able to perform his ADLs/IADLs and normal functional mobility skills without significant difficulty or pain.  Baseline: Pt reports some moderate difficulty with getting into bed and crouching at work 08/01/2022   INITIAL  (updated)  2 Pt will ambulate extended community distance without an AD without increased pain or difficulty.  Baseline: Unable to ambulate without crutches 08/01/2022 INITIAL (updated)  3 Pt will be able to perform his normal daily transfers without difficulty.  Baseline: moderate difficulty 08/01/2022 INITIAL (updated)  4 Pt will demo good squatting form, symmetrical Wb'ing, and good knee alignment without significant pain for improved functional strength and tolerance with IADLs.  Baseline: Unable to perform post-op 08/01/2022 INITIAL (updated)  5 Pt will be able to perform stairs with a reciprocal gait without the rail with good control.  Baseline: Unable to perform post-op, uses crutches 08/01/2022 INITIAL (updated)  6 Pt will be able to perform all of his school activities and work activities without significant pain or difficulty.   Baseline: unable 08/01/2022 INITIAL (updated)  7 Pt will demo 5/5 L hip and knee strength for improved tolerance with and performance of functional mobility skills and to assist in returning to PLOF.   Baseline: 08/01/2022 INITIAL (updated)     PLAN: PT FREQUENCY: 2-3x/week   PT DURATION: other: 12 weeks   PLANNED INTERVENTIONS: Therapeutic exercises, Therapeutic activity, Neuro Muscular re-education, Balance training, Gait training, Patient/Family education, Joint mobilization, Stair training, DME instructions, Aquatic Therapy, Electrical stimulation, Cryotherapy, Moist heat, Taping, and Manual therapy   PLAN FOR NEXT SESSION:  Re-vamp HEP since dropping to 1 x a week. Quad activation and progression of active hyperextension, continue gradual progression of knee flexion, gait training, gradually progress closed chain strengthening as tolerated   Hilda Blades, PT, DPT, LAT, ATC 07/02/22  5:26 PM Phone: [phone] Fax: [phone]

## 2022-07-03 ENCOUNTER — Ambulatory Visit (INDEPENDENT_AMBULATORY_CARE_PROVIDER_SITE_OTHER): Payer: No Typology Code available for payment source | Admitting: Behavioral Health

## 2022-07-03 DIAGNOSIS — F4323 Adjustment disorder with mixed anxiety and depressed mood: Secondary | ICD-10-CM | POA: Diagnosis not present

## 2022-07-03 NOTE — Progress Notes (Addendum)
[facility] Counselor/Therapist Progress Note  Patient ID: Dustin Parks, MRN: [id number],    Date: 08/01/2022  Time Spent: 55 min on Caregility video w/Pt @ work in private location & Clinician @ [facility] on Caregility video  Treatment Type: Individual Therapy  Reported Symptoms: Pt has elevated anx/dep due to Court issues this week & determinations of his Custody  Mental Status Exam: Appearance:  NA     Behavior: Agitated  Motor: NA  Speech/Language:  Normal Rate  Affect: Negative  Mood: angry  Thought process: goal directed  Thought content:   WNL  Sensory/Perceptual disturbances:   WNL  Orientation: oriented to person, place, and time/date  Attention: Fair  Concentration: Fair  Memory: WNL  Fund of knowledge:  Good  Insight:   Fair  Judgment:  Fair  Impulse Control: Good   Risk Assessment: Danger to Self:  No Self-injurious Behavior: No Danger to Others: No Duty to Warn:no Physical Aggression / Violence:No  Access to Firearms a concern: No  Gang Involvement:No   Subjective: Pt is angry today that his Son's Mother is triangulating him against his GFather re: the Custody of his 52 mos Son.  Interventions: Psycho-education/Bibliotherapy and S-F psychotherapy  Diagnosis:Adjustment disorder with mixed anxiety and depressed mood  Plan: ST: Kantor's anx/dep are elevated due to the rocky relationship he has w/his Son's Mother. Manage the feelings around the heated issues w/your Son's Custody & his Mother's wishes that make things difficult. Try to support your Son by supporting his Mother in Co-Parenting.  Target Date: 08/03/2022 Progress: 0 Frequency: Twice monthly Modality: Indiv  LT: Dustin Parks expresses elevated frustration w/his lack of fixing the situation for his Son. Pt will gain patience w/himself & the situation using communication skills that translate to improved Co-Parenting w/Son's Mother  Target Date: 10/03/2022  Progress:  0  Frequency: Twice monthly  Modality: Claretta Fraise, LMFT

## 2022-07-03 NOTE — Progress Notes (Signed)
                Nashaly Dorantes L Limuel Nieblas, LMFT 

## 2022-07-10 ENCOUNTER — Ambulatory Visit: Payer: No Typology Code available for payment source

## 2022-07-16 ENCOUNTER — Ambulatory Visit (INDEPENDENT_AMBULATORY_CARE_PROVIDER_SITE_OTHER): Payer: No Typology Code available for payment source | Admitting: Orthopaedic Surgery

## 2022-07-16 DIAGNOSIS — T8489XA Other specified complication of internal orthopedic prosthetic devices, implants and grafts, initial encounter: Secondary | ICD-10-CM

## 2022-07-16 NOTE — Progress Notes (Signed)
Post Operative Evaluation    Procedure/Date of Surgery: Left knee ACL reconstruction with quadriceps tendon allograft and lateral meniscus repair 04/30/22  Interval History:   07/16/2022: Presents today for 12-week follow-up of the above procedure.  Overall he states that the knee is one of the more positive parts of his life at this point.  He has been weightbearing on the leg with just weakness but not pain.  The knee feels stable at this time.  He continues to progress with physical therapy.    PMH/PSH/Family History/Social History/Meds/Allergies:    Past Medical History:  Diagnosis Date   Allergy    Asthma    out grown now    PONV (postoperative nausea and vomiting)    Past Surgical History:  Procedure Laterality Date   KNEE ARTHROSCOPY Left 02/02/2022   Procedure: LEFT ARTHROSCOPY KNEE LYSIS OF ADHESION AND MANIPULATION UNDER ANESTHESIA;  Surgeon: Huel Cote, MD;  Location: [facility];  Service: Orthopedics;  Laterality: Left;   KNEE ARTHROSCOPY WITH ANTERIOR CRUCIATE LIGAMENT (ACL) REPAIR WITH HAMSTRING GRAFT Left 11/08/2021   Procedure: LEFT KNEE ANTERIOR CRUCIATE LIGAMENT RECONSTRUCTION WITH QUADRICEPS TENDON AUTOGRAFT;  Surgeon: Huel Cote, MD;  Location: [facility];  Service: Orthopedics;  Laterality: Left;   KNEE ARTHROSCOPY WITH ANTERIOR CRUCIATE LIGAMENT (ACL) REPAIR WITH HAMSTRING GRAFT Left 04/30/2022   Procedure: LEFT KNEE ARTHROSCOPY WITH ANTERIOR CRUCIATE LIGAMENT (ACL) REPAIR WITH QUADRICEPS ALLOGRAFT AND LATERAL MENISCAL REPAIR;  Surgeon: Huel Cote, MD;  Location: [facility];  Service: Orthopedics;  Laterality: Left;   KNEE ARTHROSCOPY WITH MENISCAL REPAIR  11/08/2021   Procedure: LEFT LATERAL MENISCAL REPAIR;  Surgeon: Huel Cote, MD;  Location: [facility];  Service: Orthopedics;;   WISDOM TOOTH EXTRACTION  2022   Social History   Socioeconomic History   Marital status:  Single    Spouse name: Not on file   Number of children: 1   Years of education: Not on file   Highest education level: Not on file  Occupational History   Occupation: Student    Comment: [employer]  Tobacco Use   Smoking status: Never   Smokeless tobacco: Never  Vaping Use   Vaping Use: Never used  Substance and Sexual Activity   Alcohol use: Not Currently    Comment: rare   Drug use: Yes    Types: Marijuana    Comment: Delta 8 THC, 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   Sexual activity: Yes  Other Topics Concern   Not on file  Social History Narrative   Not on file   Social Determinants of Health   Financial Resource Strain: Not on file  Food Insecurity: Not on file  Transportation Needs: Not on file  Physical Activity: Not on file  Stress: Not on file  Social Connections: Not on file   Family History  Problem Relation Age of Onset   Obesity Mother    Hyperlipidemia Father    Obesity Father    Obesity Maternal Grandmother    Stroke Maternal Grandfather    Diabetes Paternal Grandmother    Allergies  Allergen Reactions   Other Swelling    All citrus fruit with the exception of lime   Current Outpatient Medications  Medication Sig Dispense Refill   acetaminophen (TYLENOL) 500 MG tablet Take 500 mg by mouth  every 6 (six) hours as needed.     aspirin EC 325 MG tablet Take 1 tablet (325 mg total) by mouth daily. 30 tablet 0   EPINEPHrine 0.3 mg/0.3 mL IJ SOAJ injection Inject 0.3 mg into the muscle as needed for anaphylaxis. 2 each 1   ibuprofen (ADVIL) 800 MG tablet Take 800 mg by mouth every 8 (eight) hours as needed.     No current facility-administered medications for this visit.   No results found.  Review of Systems:   A ROS was performed including pertinent positives and negatives as documented in the HPI.   Musculoskeletal Exam:    There were no vitals taken for this visit.  Left knee incisions are well-healed.  No erythema or drainage.  Lachman is negative.   No joint line tenderness.  Range of motion is from -3 to 130  degrees without pain.  Improved quad tone and atrophy.  No joint line tenderness. Imaging:    None  I personally reviewed and interpreted the radiographs.   Assessment:   21 year old male who is 12 weeks status post left knee ACL reconstruction with quadriceps tendon allograft and lateral meniscus repair overall doing well.  At today's visit I did describe that I would like him to get into a soft hinged knee brace at this time to provide some additional support particularly given his track record of falls.  We will plan to proceed with this.  I will see him back in 12 weeks for reassessment.  I would like him to continue to advance with physical therapy per protocol.  We did again discuss at length the possible downside from an additional fall at this time and what that would mean in terms of the importance of his recovery.  Plan :    -Return to clinic in 12 weeks      I personally saw and evaluated the patient, and participated in the management and treatment plan.  Huel Cote, MD Attending Physician, Orthopedic Surgery  This document was dictated using Dragon voice recognition software. A reasonable attempt at proof reading has been made to minimize errors.

## 2022-07-17 ENCOUNTER — Ambulatory Visit: Payer: No Typology Code available for payment source | Attending: Family Medicine

## 2022-07-17 DIAGNOSIS — R262 Difficulty in walking, not elsewhere classified: Secondary | ICD-10-CM | POA: Diagnosis present

## 2022-07-17 DIAGNOSIS — M25562 Pain in left knee: Secondary | ICD-10-CM | POA: Insufficient documentation

## 2022-07-17 DIAGNOSIS — M25662 Stiffness of left knee, not elsewhere classified: Secondary | ICD-10-CM | POA: Diagnosis present

## 2022-07-17 DIAGNOSIS — M6281 Muscle weakness (generalized): Secondary | ICD-10-CM | POA: Diagnosis present

## 2022-07-17 NOTE — Therapy (Signed)
OUTPATIENT PHYSICAL THERAPY TREATMENT NOTE  Patient Name: Dustin Parks MRN: [id number] DOB:2001-08-22, 21 y.o., male Today's Date: 07/17/2022  PCP: Haydee Salter, MD REFERRING PROVIDER: Dr Donivan Scull    PT End of Session - 07/17/22 1544     Visit Number 51    Number of Visits 29    Date for PT Re-Evaluation 08/01/22    Authorization Type [facility]    PT Start Time 7017   Pt arrived 12 minutes late to his appointment.   PT Stop Time 1613    PT Time Calculation (min) 29 min    Activity Tolerance Patient tolerated treatment well    Behavior During Therapy WFL for tasks assessed/performed                Past Medical History:  Diagnosis Date   Allergy    Asthma    out grown now    PONV (postoperative nausea and vomiting)    Past Surgical History:  Procedure Laterality Date   KNEE ARTHROSCOPY Left 02/02/2022   Procedure: LEFT ARTHROSCOPY KNEE LYSIS OF ADHESION AND MANIPULATION UNDER ANESTHESIA;  Surgeon: [name], MD;  Location: [place];  Service: Orthopedics;  Laterality: Left;   KNEE ARTHROSCOPY WITH ANTERIOR CRUCIATE LIGAMENT (ACL) REPAIR WITH HAMSTRING GRAFT Left 11/08/2021   Procedure: LEFT KNEE ANTERIOR CRUCIATE LIGAMENT RECONSTRUCTION WITH QUADRICEPS TENDON AUTOGRAFT;  Surgeon: [name], MD;  Location: [place];  Service: Orthopedics;  Laterality: Left;   KNEE ARTHROSCOPY WITH ANTERIOR CRUCIATE LIGAMENT (ACL) REPAIR WITH HAMSTRING GRAFT Left 04/30/2022   Procedure: LEFT KNEE ARTHROSCOPY WITH ANTERIOR CRUCIATE LIGAMENT (ACL) REPAIR WITH QUADRICEPS ALLOGRAFT AND LATERAL MENISCAL REPAIR;  Surgeon: [name], MD;  Location: [place];  Service: Orthopedics;  Laterality: Left;   KNEE ARTHROSCOPY WITH MENISCAL REPAIR  11/08/2021   Procedure: LEFT LATERAL MENISCAL REPAIR;  Surgeon: [name], MD;  Location: [place];  Service: Orthopedics;;   [place] EXTRACTION  2022   Patient Active  Problem List   Diagnosis Date Noted   Peripheral tear of lateral meniscus of left knee as current injury    Arthrofibrosis of knee joint, left 01/31/2022   S/P repair of anterior cruciate ligament 01/31/2022   Morbid obesity with BMI of 40.0-44.9, adult ([place]) 01/31/2022   Hypertriglyceridemia 01/31/2022   Angioedema of lips- [place] 12/13/2021   Tear of anterior cruciate ligament graft ([place])    Complex tear of lateral meniscus of left knee as current injury    History of orchitis 02/08/2021     PCP: Libby Maw, MD   REFERRING PROVIDER: [name], MD   REFERRING DIAG:  [phone] (ICD-10-CM) - Rupture of anterior cruciate ligament of left knee, initial encounter  S83.272A (ICD-10-CM) - Complex tear of lateral meniscus of left knee as current injury, initial encounter Z98.890 (ICD-10-CM) - S/P left knee arthroscopy    THERAPY DIAG:  Left knee pain, unspecified chronicity   Stiffness of left knee, not elsewhere classified   Muscle weakness (generalized)   Difficulty in walking, not elsewhere classified    PERTINENT HISTORY: 11/08/2021 Left knee ACL reconstruction with quadriceps autograft and lateral meniscal repair.    02/02/2022: Lt knee lysis of adheresion and manipulation under anesthesia  Allergy to certain adhesives including tegaderm per pt report   Re-rupture of Lt ACL and Lt meniscal tear with subsequent repair on 05/02/2022   ONSET DATE: Injury 10/09/2021 / Surgery 11/08/2021; subsequent Lt ACL and meniscus tear and repair on 05/02/2022  SUBJECTIVE: Pt reports getting a new spring brace (not a locking brace), which has been very comfortable since receiving it yesterday. He reports slight increase in pain related to increased exercise recently.   PAIN:  Are you having pain? No NPRS scale: 0/10 at rest Pain location: Left knee  Pain orientation: Left PAIN TYPE: Aching Pain description: intermittent  Aggravating factors: use of the kne Relieving  factors: rest     PRECAUTIONS: Other: per Dr. Eddie Dibbles ACL reconstruction with meniscal repair   WEIGHT BEARING RESTRICTIONS Yes   PLOF: Independent; Pt was able to perform all of his ADLs/IADLs and functional mobility skills without difficulty or limitations.  Pt was able to perform work activities.  Pt ambulated without an AD with good stability without difficulty.    PATIENT GOALS improved stability with leaning, to have the ability to run, to be able to bowl.  Return to PLOF.      OBJECTIVE:  *Unless otherwise noted, objective measures collected previously*  LE ROM:  A/PROM Left 02/03/2022 Left 05/02/2022 Left 05/04/2022 Left 05/07/2022 Left 05/14/2022 Left 07/17/2022  Knee flexion 93 AROM/ 97 AAROM/ 103PROM/ 106 PROM following MET 78 AROM, 81 AAROM with strap 82 AROM, 86 AAROM with strap 90 deg AROM heel slide 100 deg 122 with brace donned  Knee extension -1 AROM/ 0 PROM -3 AROM  4 hyper     (Blank rows = not tested)  LE MMT:  MMT Right  Left  Left 03/08/2022  Hip flexion  4+/5 5/5  Hip extension     Hip abduction     Knee flexion  5/5 5/5  Knee extension  4+/5 5/5  Ankle dorsiflexion  5/5   Ankle plantarflexion  5/5   Ankle inversion     Ankle eversion      (Blank rows = not tested)   FUNCTIONAL TESTS:  -Dead lift: Good form and depth with no pain    TODAY'S TREATMENT:  [facility] Adult PT Treatment:                                                DATE: 07/17/2022 Therapeutic Exercise: Alternating forward lunge with 10# kettlebells 3x10 with cues to not exceed 90 degrees of knee flexion SLS on Airex pad with 2kg ball toss to rebounder 3x15 Seated active hamstring stretch x79mn on Lt Manual Therapy: N/A Neuromuscular re-ed: N/A Therapeutic Activity: Dead lift x8 with 45# barbell, x8 with 75# barbell, x8 with 85# barbell  Re-assessment of objective measures with pt education Modalities: N/A Self Care: N/A   [facility] Adult PT Treatment:                                                 DATE: 07/02/2022 Therapeutic Exercise: Recumbent bike L1 x 5 min while taking subjective SLR x 10 Squat to table with Airex 2 x 10 - used mirror for visual feedback on 2nd set Standing TKE with blue 2 x 10 Wall squat 2 x 10 Tandem stance 2 x 30 sec - left foot back Sidelying hip abduction 2 x 15 Bridge 2 x 10 with 3-5 sec hold Prone hamstring curl 2 x 15 SLS 2 x 30 sec   [facility] Adult PT Treatment:  DATE: 06/27/2022 Therapeutic Exercise: Nu-step L10 x 6 min LE only Standing hamstring stretch 2 x 30 sec Standing quad stretch 2 x 30 sec Slant board calf stretch 2 x 30 sec LLE single leg press 2 x 12 with 40# (avoiding 90 degrees of flexion per restrictions), 1 x 15 set con bil / ecc LLE 60# Standing hip abduction on cybex 2 x 15 12.5# Bridge with heels on black foam roller 2 x 12 Step up 6 inch with LLE x 20     PATIENT EDUCATION: Education details: HEP Person educated: Patient Education method: Consulting civil engineer, Media planner, Corporate treasurer cues, Verbal cues Education comprehension: verbalized understanding, returned demonstration, verbal cues required, tactile cues required, and needs further education   HOME EXERCISE PROGRAM: Access Code: [email or web address]    ASSESSMENT: CLINICAL IMPRESSION: Due to pt arriving late to his treatment, the session was truncated today. He demonstrates excellent improvement in Lt knee AROM and functional knee strength. Pt responded well to progressed exercises and will continue to benefit from skilled PT to address his primary impairments and return to his prior level of function with less limitation.    GOALS: SHORT TERM GOALS:   STG Name Target Date Goal status  1 Pt will be independent and compliant with HEP for improved pain, strength, and ROM.  Baseline: New HEP administered 05/02/2022 05/10/2022: Achieved 06/13/2022 ACHIEVED  2 Pt will demo a good quad set and perform supine SLRs independently with  brace for improved quad strength. Baseline: Pain with quad setting/ SLR 05/10/2022: achieved 06/13/2022 ACHIEVED  3 Pt will demo improved improved L knee extension PROM/AROM to 0/3 and flexion PROM to 60 deg for improved mobility and stiffness. Baseline: -3 to 78 degrees AROM 07/17/2022: -4-122 AROM 06/13/2022 ACHIEVED  4 Pt will demo improved improved L knee extension AROM to 0 deg and  flexion AAROM/PROM to 80/90 deg for improved mobility and stiffness. Baseline: -3 to 78 degrees AROM 05/10/2022: 0-90 degrees 06/13/2022 ACHIEVED  5 Pt will demo L knee AROM to be 0 - 120 deg for improved stiffness and performance of functional mobility.   Baseline: -3 to 78 degrees AROM 07/17/2022: -4-122 AROM 06/13/2022 ACHIEVED  6 Pt will progress Wb'ing with gait per MD orders and protocol without adverse effects.  Baseline: 07/17/2022: Pt WBAT with hinge brace 06/13/2022 ACHIEVED  7 Pt will progress with closed chain exercises per protocol without adverse effects for improved functional strength and stability.  Baseline: 07/17/2022: ACHIEVED 06/13/2022 ACHIEVED  8 Pt will score 0-1 on the lateral step down test on a 4 inch step for improved quad eccentric control.   06/13/2022   INITIAL (updated)  9 Pt will score 0-1 on the lateral step down test on a 6 inch step for improved quad eccentric control and performance of stairs.   06/13/2022 INITIAL (updated)  10 Pt will ambulate with a normalized heel to toe gait without limping without AD Baseline: Pt ambulates with swing-through gait with axillary crutches 06/13/2022 INITIAL (updated)    LONG TERM GOALS:    LTG Name Target Date Goal status  1 Pt will be able to perform his ADLs/IADLs and normal functional mobility skills without significant difficulty or pain.  Baseline: Pt reports some moderate difficulty with getting into bed and crouching at work 08/01/2022   INITIAL (updated)  2 Pt will ambulate extended community distance without an AD without increased pain or  difficulty.  Baseline: Unable to ambulate without crutches 08/01/2022 INITIAL (updated)  3 Pt will be able to perform  his normal daily transfers without difficulty.  Baseline: moderate difficulty 08/01/2022 INITIAL (updated)  4 Pt will demo good squatting form, symmetrical Wb'ing, and good knee alignment without significant pain for improved functional strength and tolerance with IADLs.  Baseline: Unable to perform post-op 08/01/2022 INITIAL (updated)  5 Pt will be able to perform stairs with a reciprocal gait without the rail with good control.  Baseline: Unable to perform post-op, uses crutches 08/01/2022 INITIAL (updated)  6 Pt will be able to perform all of his school activities and work activities without significant pain or difficulty.   Baseline: unable 08/01/2022 INITIAL (updated)  7 Pt will demo 5/5 L hip and knee strength for improved tolerance with and performance of functional mobility skills and to assist in returning to PLOF.   Baseline: 08/01/2022 INITIAL (updated)     PLAN: PT FREQUENCY: 2-3x/week   PT DURATION: other: 12 weeks   PLANNED INTERVENTIONS: Therapeutic exercises, Therapeutic activity, Neuro Muscular re-education, Balance training, Gait training, Patient/Family education, Joint mobilization, Stair training, DME instructions, Aquatic Therapy, Electrical stimulation, Cryotherapy, Moist heat, Taping, and Manual therapy   PLAN FOR NEXT SESSION:  Re-vamp HEP since dropping to 1 x a week. Quad activation and progression of active hyperextension, continue gradual progression of knee flexion, gait training, gradually progress closed chain strengthening as tolerated   Vanessa Yznaga, PT, DPT 07/17/22 4:15 PM

## 2022-07-23 ENCOUNTER — Encounter: Payer: Self-pay | Admitting: Physical Therapy

## 2022-07-23 ENCOUNTER — Ambulatory Visit: Payer: No Typology Code available for payment source | Admitting: Physical Therapy

## 2022-07-23 DIAGNOSIS — M25562 Pain in left knee: Secondary | ICD-10-CM

## 2022-07-23 DIAGNOSIS — M6281 Muscle weakness (generalized): Secondary | ICD-10-CM

## 2022-07-23 DIAGNOSIS — R262 Difficulty in walking, not elsewhere classified: Secondary | ICD-10-CM

## 2022-07-23 DIAGNOSIS — M25662 Stiffness of left knee, not elsewhere classified: Secondary | ICD-10-CM

## 2022-07-23 NOTE — Therapy (Signed)
OUTPATIENT PHYSICAL THERAPY TREATMENT NOTE  Patient Name: Dustin Parks MRN: [id number] DOB:January 10, 2001, 21 y.o., male Today's Date: 07/23/2022  PCP: Haydee Salter, MD REFERRING PROVIDER: Dr Donivan Scull    PT End of Session - 07/23/22 1602     Visit Number 52    Number of Visits 22    Date for PT Re-Evaluation 08/01/22    Authorization Type [facility] [employer]    Authorization Time Period 03/19/2022 - 06/19/2022    PT Start Time 1500    PT Stop Time 1548    PT Time Calculation (min) 48 min                 Past Medical History:  Diagnosis Date   Allergy    Asthma    out grown now    PONV (postoperative nausea and vomiting)    Past Surgical History:  Procedure Laterality Date   KNEE ARTHROSCOPY Left 02/02/2022   Procedure: LEFT ARTHROSCOPY KNEE LYSIS OF ADHESION AND MANIPULATION UNDER ANESTHESIA;  Surgeon: [name], MD;  Location: [place];  Service: Orthopedics;  Laterality: Left;   KNEE ARTHROSCOPY WITH ANTERIOR CRUCIATE LIGAMENT (ACL) REPAIR WITH HAMSTRING GRAFT Left 11/08/2021   Procedure: LEFT KNEE ANTERIOR CRUCIATE LIGAMENT RECONSTRUCTION WITH QUADRICEPS TENDON AUTOGRAFT;  Surgeon: [name], MD;  Location: [place];  Service: Orthopedics;  Laterality: Left;   KNEE ARTHROSCOPY WITH ANTERIOR CRUCIATE LIGAMENT (ACL) REPAIR WITH HAMSTRING GRAFT Left 04/30/2022   Procedure: LEFT KNEE ARTHROSCOPY WITH ANTERIOR CRUCIATE LIGAMENT (ACL) REPAIR WITH QUADRICEPS ALLOGRAFT AND LATERAL MENISCAL REPAIR;  Surgeon: [name], MD;  Location: [place];  Service: Orthopedics;  Laterality: Left;   KNEE ARTHROSCOPY WITH MENISCAL REPAIR  11/08/2021   Procedure: LEFT LATERAL MENISCAL REPAIR;  Surgeon: [name], MD;  Location: [place];  Service: Orthopedics;;   [place] EXTRACTION  2022   Patient Active Problem List   Diagnosis Date Noted   Peripheral tear of lateral meniscus of left knee as current injury     Arthrofibrosis of knee joint, left 01/31/2022   S/P repair of anterior cruciate ligament 01/31/2022   Morbid obesity with BMI of 40.0-44.9, adult ([place]) 01/31/2022   Hypertriglyceridemia 01/31/2022   Angioedema of lips- [place] 12/13/2021   Tear of anterior cruciate ligament graft ([place])    Complex tear of lateral meniscus of left knee as current injury    History of orchitis 02/08/2021     PCP: Libby Maw, MD   REFERRING PROVIDER: [name], MD   REFERRING DIAG:  [phone] (ICD-10-CM) - Rupture of anterior cruciate ligament of left knee, initial encounter  S83.272A (ICD-10-CM) - Complex tear of lateral meniscus of left knee as current injury, initial encounter Z98.890 (ICD-10-CM) - S/P left knee arthroscopy    THERAPY DIAG:  Left knee pain, unspecified chronicity   Stiffness of left knee, not elsewhere classified   Muscle weakness (generalized)   Difficulty in walking, not elsewhere classified    PERTINENT HISTORY: 11/08/2021 Left knee ACL reconstruction with quadriceps autograft and lateral meniscal repair.    02/02/2022: Lt knee lysis of adheresion and manipulation under anesthesia  Allergy to certain adhesives including tegaderm per pt report   Re-rupture of Lt ACL and Lt meniscal tear with subsequent repair on 05/02/2022   ONSET DATE: Injury 10/09/2021 / Surgery 11/08/2021; subsequent Lt ACL and meniscus tear and repair on 05/02/2022     SUBJECTIVE: " I am feeling pretty good, They gave me the brace I am  supposed to wear all the time now. I do get some minor buckling but nothing major."  PAIN:  Are you having pain? No NPRS scale: 0/10 at rest Pain location: Left knee  Pain orientation: Left PAIN TYPE: Aching Pain description: intermittent  Aggravating factors: use of the kne Relieving factors: rest     PRECAUTIONS: Other: per Dr. Eddie Dibbles ACL reconstruction with meniscal repair   WEIGHT BEARING RESTRICTIONS Yes   PLOF: Independent; Pt was able to  perform all of his ADLs/IADLs and functional mobility skills without difficulty or limitations.  Pt was able to perform work activities.  Pt ambulated without an AD with good stability without difficulty.    PATIENT GOALS improved stability with leaning, to have the ability to run, to be able to bowl.  Return to PLOF.      OBJECTIVE:  *Unless otherwise noted, objective measures collected previously*  LE ROM:  A/PROM Left 02/03/2022 Left 05/02/2022 Left 05/04/2022 Left 05/07/2022 Left 05/14/2022 Left 07/17/2022  Knee flexion 93 AROM/ 97 AAROM/ 103PROM/ 106 PROM following MET 78 AROM, 81 AAROM with strap 82 AROM, 86 AAROM with strap 90 deg AROM heel slide 100 deg 122 with brace donned  Knee extension -1 AROM/ 0 PROM -3 AROM  4 hyper     (Blank rows = not tested)  LE MMT:  MMT Right  Left  Left 03/08/2022  Hip flexion  4+/5 5/5  Hip extension     Hip abduction     Knee flexion  5/5 5/5  Knee extension  4+/5 5/5  Ankle dorsiflexion  5/5   Ankle plantarflexion  5/5   Ankle inversion     Ankle eversion      (Blank rows = not tested)   FUNCTIONAL TESTS:  -Dead lift: Good form and depth with no pain    TODAY'S TREATMENT: [facility] Adult PT Treatment:                                                DATE: 07/23/2022 Therapeutic Exercise: Ellitpical L2 x 6 min ramp L2 Hamstring stretch standing 1 x 30 sec Standing quad stretch 1 x 30 sec Slant board gastroc stretch 2 x 30 Leg press bil LE 1 x 25 80# Dead lift 3 x 10 95#  Stool scoot 4 x 60 ft bil LE Walking lunge 2 x 15  [facility] Adult PT Treatment:                                                DATE: 07/17/2022 Therapeutic Exercise: Alternating forward lunge with 10# kettlebells 3x10 with cues to not exceed 90 degrees of knee flexion SLS on Airex pad with 2kg ball toss to rebounder 3x15 Seated active hamstring stretch x28mn on Lt Manual Therapy: N/A Neuromuscular re-ed: N/A Therapeutic Activity: Dead lift x8 with 45# barbell, x8 with  75# barbell, x8 with 85# barbell  Re-assessment of objective measures with pt education Modalities: N/A Self Care: N/A   [facility]  [facility]: 07/02/2022 Therapeutic Exercise: Recumbent bike L1 x 5 min while taking subjective SLR x 10 Squat to table with Airex 2 x 10 - used mirror for visual feedback on 2nd set Standing TKE with blue 2 x 10 Wall squat 2 x 10 Tandem stance 2 x 30 sec - left foot back Sidelying hip abduction 2 x 15 Bridge 2 x 10 with 3-5 sec hold Prone hamstring curl 2 x 15 SLS 2 x 30 sec     PATIENT EDUCATION: Education details: HEP Person educated: Patient Education method: Consulting civil engineer, Media planner, Corporate treasurer cues, Verbal cues Education comprehension: verbalized understanding, returned demonstration, verbal cues required, tactile cues required, and needs further education   HOME EXERCISE PROGRAM: Access Code: [email or web address]    ASSESSMENT: CLINICAL IMPRESSION: Pt arrives to session with updated knee brace he is supposed to wear all the time. He denies any pay or issues with the knee. He is 12 weeks post op today so continued working on [employer] strengthening with increased reps/ sets. He did well with all exercise but does fatigue quickly, end of session he reports no pain or issues.    GOALS: SHORT TERM GOALS:   STG Name Target Date Goal status  1 Pt will be independent and compliant with HEP for improved pain, strength, and ROM.  Baseline: New HEP administered 05/02/2022 05/10/2022: Achieved 06/13/2022 ACHIEVED  2 Pt will demo a good quad set and perform supine SLRs independently with brace for improved quad strength. Baseline: Pain with quad setting/ SLR 05/10/2022: achieved 06/13/2022 ACHIEVED  3 Pt will demo improved improved L knee extension PROM/AROM to 0/3 and flexion PROM to 60 deg for improved mobility and stiffness. Baseline: -3 to 78 degrees AROM 07/17/2022: -4-122 AROM 06/13/2022 ACHIEVED  4 Pt will demo  improved improved L knee extension AROM to 0 deg and  flexion AAROM/PROM to 80/90 deg for improved mobility and stiffness. Baseline: -3 to 78 degrees AROM 05/10/2022: 0-90 degrees 06/13/2022 ACHIEVED  5 Pt will demo L knee AROM to be 0 - 120 deg for improved stiffness and performance of functional mobility.   Baseline: -3 to 78 degrees AROM 07/17/2022: -4-122 AROM 06/13/2022 ACHIEVED  6 Pt will progress Wb'ing with gait per MD orders and protocol without adverse effects.  Baseline: 07/17/2022: Pt WBAT with hinge brace 06/13/2022 ACHIEVED  7 Pt will progress with closed chain exercises per protocol without adverse effects for improved functional strength and stability.  Baseline: 07/17/2022: ACHIEVED 06/13/2022 ACHIEVED  8 Pt will score 0-1 on the lateral step down test on a 4 inch step for improved quad eccentric control.   06/13/2022   INITIAL (updated)  9 Pt will score 0-1 on the lateral step down test on a 6 inch step for improved quad eccentric control and performance of stairs.   06/13/2022 INITIAL (updated)  10 Pt will ambulate with a normalized heel to toe gait without limping without AD Baseline: Pt ambulates with swing-through gait with axillary crutches 06/13/2022 INITIAL (updated)    LONG TERM GOALS:    LTG Name Target Date Goal status  1 Pt will be able to perform his ADLs/IADLs and normal functional mobility skills without significant difficulty or pain.  Baseline: Pt reports some moderate difficulty with getting into bed and crouching at work 08/01/2022   INITIAL (updated)  2 Pt will ambulate extended community distance without an AD without increased pain or difficulty.  Baseline: Unable to ambulate without crutches 08/01/2022 INITIAL (updated)  3 Pt will be able to perform his  normal daily transfers without difficulty.  Baseline: moderate difficulty 08/01/2022 INITIAL (updated)  4 Pt will demo good squatting form, symmetrical Wb'ing, and good knee alignment without significant pain for improved  functional strength and tolerance with IADLs.  Baseline: Unable to perform post-op 08/01/2022 INITIAL (updated)  5 Pt will be able to perform stairs with a reciprocal gait without the rail with good control.  Baseline: Unable to perform post-op, uses crutches 08/01/2022 INITIAL (updated)  6 Pt will be able to perform all of his school activities and work activities without significant pain or difficulty.   Baseline: unable 08/01/2022 INITIAL (updated)  7 Pt will demo 5/5 L hip and knee strength for improved tolerance with and performance of functional mobility skills and to assist in returning to PLOF.   Baseline: 08/01/2022 INITIAL (updated)     PLAN: PT FREQUENCY: 2-3x/week   PT DURATION: other: 12 weeks   PLANNED INTERVENTIONS: Therapeutic exercises, Therapeutic activity, Neuro Muscular re-education, Balance training, Gait training, Patient/Family education, Joint mobilization, Stair training, DME instructions, Aquatic Therapy, Electrical stimulation, Cryotherapy, Moist heat, Taping, and Manual therapy   PLAN FOR NEXT SESSION:  Re-vamp HEP since dropping to 1 x a week. Quad activation and progression of active hyperextension, continue gradual progression of knee flexion, gait training, gradually progress closed chain strengthening as tolerated  Shanquita Ronning PT, DPT, LAT, ATC  07/23/22  4:03 PM

## 2022-07-30 ENCOUNTER — Encounter: Payer: Self-pay | Admitting: Physical Therapy

## 2022-07-30 ENCOUNTER — Ambulatory Visit: Payer: No Typology Code available for payment source | Admitting: Physical Therapy

## 2022-07-30 DIAGNOSIS — M25662 Stiffness of left knee, not elsewhere classified: Secondary | ICD-10-CM

## 2022-07-30 DIAGNOSIS — M25562 Pain in left knee: Secondary | ICD-10-CM | POA: Diagnosis not present

## 2022-07-30 DIAGNOSIS — M6281 Muscle weakness (generalized): Secondary | ICD-10-CM

## 2022-07-30 NOTE — Therapy (Signed)
OUTPATIENT PHYSICAL THERAPY TREATMENT NOTE / Re-Certification  Patient Name: Dustin Parks MRN: [id number] DOB:[birth year], 21 y.o., male 18 Date: 07/30/2022  PCP: Haydee Salter, MD REFERRING PROVIDER: Dr Donivan Scull    PT End of Session - 07/30/22 1500     Visit Number 55    Number of Visits 75    Date for PT Re-Evaluation 10/22/22    Authorization Type [facility] [employer]    Authorization Time Period 03/19/2022 - 06/19/2022    PT Start Time 1459    PT Stop Time 1545    PT Time Calculation (min) 46 min    Activity Tolerance Patient tolerated treatment well    Behavior During Therapy [facility] for tasks assessed/performed                  Past Medical History:  Diagnosis Date   Allergy    Asthma    out grown now    PONV (postoperative nausea and vomiting)    Past Surgical History:  Procedure Laterality Date   KNEE ARTHROSCOPY Left 02/02/2022   Procedure: LEFT ARTHROSCOPY KNEE LYSIS OF ADHESION AND MANIPULATION UNDER ANESTHESIA;  Surgeon: [name], MD;  Location: [place];  Service: Orthopedics;  Laterality: Left;   KNEE ARTHROSCOPY WITH ANTERIOR CRUCIATE LIGAMENT (ACL) REPAIR WITH HAMSTRING GRAFT Left 11/08/2021   Procedure: LEFT KNEE ANTERIOR CRUCIATE LIGAMENT RECONSTRUCTION WITH QUADRICEPS TENDON AUTOGRAFT;  Surgeon: [name], MD;  Location: [place];  Service: Orthopedics;  Laterality: Left;   KNEE ARTHROSCOPY WITH ANTERIOR CRUCIATE LIGAMENT (ACL) REPAIR WITH HAMSTRING GRAFT Left 04/30/2022   Procedure: LEFT KNEE ARTHROSCOPY WITH ANTERIOR CRUCIATE LIGAMENT (ACL) REPAIR WITH QUADRICEPS ALLOGRAFT AND LATERAL MENISCAL REPAIR;  Surgeon: [name], MD;  Location: [place];  Service: Orthopedics;  Laterality: Left;   KNEE ARTHROSCOPY WITH MENISCAL REPAIR  11/08/2021   Procedure: LEFT LATERAL MENISCAL REPAIR;  Surgeon: [name], MD;  Location: [place];  Service: Orthopedics;;   [place] EXTRACTION   2022   Patient Active Problem List   Diagnosis Date Noted   Peripheral tear of lateral meniscus of left knee as current injury    Arthrofibrosis of knee joint, left 01/31/2022   S/P repair of anterior cruciate ligament 01/31/2022   Morbid obesity with BMI of 40.0-44.9, adult ([place]) 01/31/2022   Hypertriglyceridemia 01/31/2022   Angioedema of lips- [place] 12/13/2021   Tear of anterior cruciate ligament graft ([place])    Complex tear of lateral meniscus of left knee as current injury    History of orchitis 02/08/2021     PCP: Libby Maw, MD   REFERRING PROVIDER: [name], MD   REFERRING DIAG:  [phone] (ICD-10-CM) - Rupture of anterior cruciate ligament of left knee, initial encounter  S83.272A (ICD-10-CM) - Complex tear of lateral meniscus of left knee as current injury, initial encounter Z98.890 (ICD-10-CM) - S/P left knee arthroscopy    THERAPY DIAG:  Left knee pain, unspecified chronicity   Stiffness of left knee, not elsewhere classified   Muscle weakness (generalized)   Difficulty in walking, not elsewhere classified    PERTINENT HISTORY: 11/08/2021 Left knee ACL reconstruction with quadriceps autograft and lateral meniscal repair.    02/02/2022: Lt knee lysis of adheresion and manipulation under anesthesia  Allergy to certain adhesives including tegaderm per pt report   Re-rupture of Lt ACL and Lt meniscal tear with subsequent repair on 04/30/2022   ONSET DATE: Injury 10/09/2021 / Surgery 11/08/2021; subsequent Lt ACL and meniscus tear  and repair on 05/02/2022     SUBJECTIVE: " I've noted having som   PAIN:  Are you having pain? No NPRS scale: 0/10 at rest Pain location: Left knee  Pain orientation: Left PAIN TYPE: Aching Pain description: intermittent  Aggravating factors: use of the kne Relieving factors: rest     PRECAUTIONS: Other: per Dr. Eddie Dibbles ACL reconstruction with meniscal repair   WEIGHT BEARING RESTRICTIONS Yes   PLOF:  Independent; Pt was able to perform all of his ADLs/IADLs and functional mobility skills without difficulty or limitations.  Pt was able to perform work activities.  Pt ambulated without an AD with good stability without difficulty.    PATIENT GOALS improved stability with leaning, to have the ability to run, to be able to bowl.  Return to PLOF.      OBJECTIVE:  *Unless otherwise noted, objective measures collected previously*  LE ROM:  A/PROM Left 02/03/2022 Left 05/02/2022 Left 05/04/2022 Left 05/07/2022 Left 05/14/2022 Left 07/17/2022  Knee flexion 93 AROM/ 97 AAROM/ 103PROM/ 106 PROM following MET 78 AROM, 81 AAROM with strap 82 AROM, 86 AAROM with strap 90 deg AROM heel slide 100 deg 122 with brace donned  Knee extension -1 AROM/ 0 PROM -3 AROM  4 hyper     (Blank rows = not tested)  LE MMT:  MMT Right  Left  Left 03/08/2022 Left 07/30/2022  Hip flexion  4+/5 5/5 5/5  Hip extension      Hip abduction      Knee flexion  5/5 5/5   Knee extension  4+/5 5/5   Ankle dorsiflexion  5/5    Ankle plantarflexion  5/5    Ankle inversion      Ankle eversion       (Blank rows = not tested)   FUNCTIONAL TESTS:  -Dead lift: Good form and depth with no pain    TODAY'S TREATMENT: [facility] Adult PT Treatment:                                                DATE: 07/30/2022 Therapeutic Exercise: Recumbent bike L 5 x 6 min (seat lowered to lowest leve) Quad 2 x 30 seconds thomas test position. Hamstring stretch 2 x 30 sec (supine with strap) LLE singe leg press 2 x 15 40# Standing hip abduction 2x 20 12.5# using hip cybex LLE hip hinge 2 x 15 10# KB  Manual Therapy: MTPR along the vastus lateralis DTM along the Vastus lateralis   [facility] Adult PT Treatment:                                                DATE: 07/23/2022 Therapeutic Exercise: Ellitpical L2 x 6 min ramp L2 Hamstring stretch standing 1 x 30 sec Standing quad stretch 1 x 30 sec Slant board gastroc stretch 2 x 30 Leg press  bil LE 1 x 25 80# Dead lift 3 x 10 95#  Stool scoot 4 x 60 ft bil LE Walking lunge 2 x 15  [facility] Adult PT Treatment:  DATE: 07/17/2022 Therapeutic Exercise: Alternating forward lunge with 10# kettlebells 3x10 with cues to not exceed 90 degrees of knee flexion SLS on Airex pad with 2kg ball toss to rebounder 3x15 Seated active hamstring stretch x33mn on Lt Manual Therapy: N/A Neuromuscular re-ed: N/A Therapeutic Activity: Dead lift x8 with 45# barbell, x8 with 75# barbell, x8 with 85# barbell  Re-assessment of objective measures with pt education Modalities: N/A Self Care: N/A    PATIENT EDUCATION: Education details: HEP Person educated: Patient Education method: EConsulting civil engineer Demonstration, TCorporate treasurercues, Verbal cues Education comprehension: verbalized understanding, returned demonstration, verbal cues required, tactile cues required, and needs further education   HOME EXERCISE PROGRAM: Access Code: [email or web address]   ASSESSMENT: CLINICAL IMPRESSION: Patient is making good progress with physical therapy. He did report some soreness in the anterolateral aspect of the L knee which he reports may be related to doing 2 days of work requiring prolonged standing. Following STW/ MTPR and stretching he reported relief of tension/ soreness.continued working on LLE strengthening which with increased reps/ sets to promote strength and endurance. He is 13 weeks post-op and would benefit from physical therapy to continue working on strengthening, proprioception and maximize his function by addressing the deficits listed.    GOALS: SHORT TERM GOALS:   STG Name Target Date Goal status  1 Pt will be independent and compliant with HEP for improved pain, strength, and ROM.  Baseline: New HEP administered 05/02/2022 05/10/2022: Achieved 06/13/2022 ACHIEVED  2 Pt will demo a good quad set and perform supine SLRs independently with brace for improved quad  strength. Baseline: Pain with quad setting/ SLR 05/10/2022: achieved 06/13/2022 ACHIEVED  3 Pt will demo improved improved L knee extension PROM/AROM to 0/3 and flexion PROM to 60 deg for improved mobility and stiffness. Baseline: -3 to 78 degrees AROM 07/17/2022: -4-122 AROM 06/13/2022 ACHIEVED  4 Pt will demo improved improved L knee extension AROM to 0 deg and  flexion AAROM/PROM to 80/90 deg for improved mobility and stiffness. Baseline: -3 to 78 degrees AROM 05/10/2022: 0-90 degrees 06/13/2022 ACHIEVED  5 Pt will demo L knee AROM to be 0 - 120 deg for improved stiffness and performance of functional mobility.   Baseline: -3 to 78 degrees AROM 07/17/2022: -4-122 AROM 06/13/2022 ACHIEVED  6 Pt will progress Wb'ing with gait per MD orders and protocol without adverse effects.  Baseline: 07/17/2022: Pt WBAT with hinge brace 06/13/2022 ACHIEVED  7 Pt will progress with closed chain exercises per protocol without adverse effects for improved functional strength and stability.  Baseline: 07/17/2022: ACHIEVED 06/13/2022 ACHIEVED  8 Pt will score 0-1 on the lateral step down test on a 4 inch step for improved quad eccentric control.   06/13/2022   MET 07/30/2022  9 Pt will score 0-1 on the lateral step down test on a 6 inch step for improved quad eccentric control and performance of stairs.   06/13/2022 MET 07/30/2022  10 Pt will ambulate with a normalized heel to toe gait without limping without AD Baseline: Pt ambulates with swing-through gait with axillary crutches 06/13/2022 MET 07/30/2022    LONG TERM GOALS:    LTG Name Target Date Goal status  1 Pt will be able to perform his ADLs/IADLs and normal functional mobility skills without significant difficulty or pain.  Baseline: Pt reports some moderate difficulty with getting into bed and crouching at work 08/01/2022   MET 07/30/2022  2 Pt will ambulate extended community distance without an AD without increased pain or difficulty.  Baseline: Unable to ambulate  without crutches 08/01/2022 MET 07/30/2022  3 Pt will be able to perform his normal daily transfers without difficulty.  Baseline: moderate difficulty 08/01/2022 MET 07/30/2022  4 Pt will demo good squatting form, symmetrical Wb'ing, and good knee alignment without significant pain for improved functional strength and tolerance with IADLs.  Baseline: Unable to perform post-op 08/01/2022 Partially MET  07/30/2022  5 Pt will be able to perform stairs with a reciprocal gait without the rail with good control.  Baseline: Unable to perform post-op, uses crutches 08/01/2022 Partially MET  07/30/2022  6 Pt will be able to perform all of his school activities and work activities without significant pain or difficulty.   Baseline: unable 08/01/2022 Partially MET  07/30/2022  7 Pt will demo 5/5 L hip and knee strength for improved tolerance with and performance of functional mobility skills and to assist in returning to PLOF.   Baseline: 08/01/2022 Partially MET  07/30/2022     PLAN: PT FREQUENCY: 2-3x/week   PT DURATION: other: 12 weeks   PLANNED INTERVENTIONS: Therapeutic exercises, Therapeutic activity, Neuro Muscular re-education, Balance training, Gait training, Patient/Family education, Joint mobilization, Stair training, DME instructions, Aquatic Therapy, Electrical stimulation, Cryotherapy, Moist heat, Taping, and Manual therapy   PLAN FOR NEXT SESSION:  Re-vamp HEP, gross hip strengthening, balance training.    Briena Swingler PT, DPT, LAT, ATC  07/30/22  4:00 PM

## 2022-08-06 ENCOUNTER — Ambulatory Visit: Payer: No Typology Code available for payment source | Attending: Family Medicine | Admitting: Physical Therapy

## 2022-08-06 ENCOUNTER — Encounter: Payer: Self-pay | Admitting: Physical Therapy

## 2022-08-06 DIAGNOSIS — M25562 Pain in left knee: Secondary | ICD-10-CM | POA: Diagnosis present

## 2022-08-06 DIAGNOSIS — M25662 Stiffness of left knee, not elsewhere classified: Secondary | ICD-10-CM | POA: Insufficient documentation

## 2022-08-06 DIAGNOSIS — R262 Difficulty in walking, not elsewhere classified: Secondary | ICD-10-CM | POA: Diagnosis present

## 2022-08-06 DIAGNOSIS — M6281 Muscle weakness (generalized): Secondary | ICD-10-CM | POA: Insufficient documentation

## 2022-08-06 NOTE — Therapy (Signed)
OUTPATIENT PHYSICAL THERAPY TREATMENT NOTE / Re-Certification  Patient Name: Dustin Parks MRN: [id number] DOB:12/06/2000, 21 y.o., male 65 Date: 08/06/2022  PCP: Haydee Salter, MD REFERRING PROVIDER: Dr Donivan Scull    PT End of Session - 08/06/22 1455     Visit Number 48    Number of Visits 76    Date for PT Re-Evaluation 10/22/22    Authorization Type [facility] [employer]    Authorization Time Period 03/19/2022 - 06/19/2022    PT Start Time 1457    PT Stop Time 1540    PT Time Calculation (min) 43 min    Activity Tolerance Patient tolerated treatment well    Behavior During Therapy [facility] for tasks assessed/performed                   Past Medical History:  Diagnosis Date   Allergy    Asthma    out grown now    PONV (postoperative nausea and vomiting)    Past Surgical History:  Procedure Laterality Date   KNEE ARTHROSCOPY Left 02/02/2022   Procedure: LEFT ARTHROSCOPY KNEE LYSIS OF ADHESION AND MANIPULATION UNDER ANESTHESIA;  Surgeon: [name], MD;  Location: [place];  Service: Orthopedics;  Laterality: Left;   KNEE ARTHROSCOPY WITH ANTERIOR CRUCIATE LIGAMENT (ACL) REPAIR WITH HAMSTRING GRAFT Left 11/08/2021   Procedure: LEFT KNEE ANTERIOR CRUCIATE LIGAMENT RECONSTRUCTION WITH QUADRICEPS TENDON AUTOGRAFT;  Surgeon: [name], MD;  Location: [place];  Service: Orthopedics;  Laterality: Left;   KNEE ARTHROSCOPY WITH ANTERIOR CRUCIATE LIGAMENT (ACL) REPAIR WITH HAMSTRING GRAFT Left 04/30/2022   Procedure: LEFT KNEE ARTHROSCOPY WITH ANTERIOR CRUCIATE LIGAMENT (ACL) REPAIR WITH QUADRICEPS ALLOGRAFT AND LATERAL MENISCAL REPAIR;  Surgeon: [name], MD;  Location: [place];  Service: Orthopedics;  Laterality: Left;   KNEE ARTHROSCOPY WITH MENISCAL REPAIR  11/08/2021   Procedure: LEFT LATERAL MENISCAL REPAIR;  Surgeon: [name], MD;  Location: [place];  Service: Orthopedics;;   [place] EXTRACTION   2022   Patient Active Problem List   Diagnosis Date Noted   Peripheral tear of lateral meniscus of left knee as current injury    Arthrofibrosis of knee joint, left 01/31/2022   S/P repair of anterior cruciate ligament 01/31/2022   Morbid obesity with BMI of 40.0-44.9, adult ([place]) 01/31/2022   Hypertriglyceridemia 01/31/2022   Angioedema of lips- [place] 12/13/2021   Tear of anterior cruciate ligament graft ([place])    Complex tear of lateral meniscus of left knee as current injury    History of orchitis 02/08/2021     PCP: Libby Maw, MD   REFERRING PROVIDER: [name], MD   REFERRING DIAG:  [phone] (ICD-10-CM) - Rupture of anterior cruciate ligament of left knee, initial encounter  S83.272A (ICD-10-CM) - Complex tear of lateral meniscus of left knee as current injury, initial encounter Z98.890 (ICD-10-CM) - S/P left knee arthroscopy    THERAPY DIAG:  Left knee pain, unspecified chronicity   Stiffness of left knee, not elsewhere classified   Muscle weakness (generalized)   Difficulty in walking, not elsewhere classified    PERTINENT HISTORY: 11/08/2021 Left knee ACL reconstruction with quadriceps autograft and lateral meniscal repair.    02/02/2022: Lt knee lysis of adheresion and manipulation under anesthesia  Allergy to certain adhesives including tegaderm per pt report   Re-rupture of Lt ACL and Lt meniscal tear with subsequent repair on 04/30/2022   ONSET DATE: Injury 10/09/2021 / Surgery 11/08/2021; subsequent Lt ACL and meniscus  tear and repair on 05/02/2022     SUBJECTIVE: "I am doing pretty good, no issues."  PAIN:  Are you having pain? No NPRS scale: 0/10 at rest Pain location: Left knee  Pain orientation: Left PAIN TYPE: Aching Pain description: intermittent  Aggravating factors: use of the kne Relieving factors: rest     PRECAUTIONS: Other: per Dr. Eddie Dibbles ACL reconstruction with meniscal repair   WEIGHT BEARING RESTRICTIONS Yes    PLOF: Independent; Pt was able to perform all of his ADLs/IADLs and functional mobility skills without difficulty or limitations.  Pt was able to perform work activities.  Pt ambulated without an AD with good stability without difficulty.    PATIENT GOALS improved stability with leaning, to have the ability to run, to be able to bowl.  Return to PLOF.      OBJECTIVE:  *Unless otherwise noted, objective measures collected previously*  LE ROM:  A/PROM Left 02/03/2022 Left 05/02/2022 Left 05/04/2022 Left 05/07/2022 Left 05/14/2022 Left 07/17/2022  Knee flexion 93 AROM/ 97 AAROM/ 103PROM/ 106 PROM following MET 78 AROM, 81 AAROM with strap 82 AROM, 86 AAROM with strap 90 deg AROM heel slide 100 deg 122 with brace donned  Knee extension -1 AROM/ 0 PROM -3 AROM  4 hyper     (Blank rows = not tested)  LE MMT:  MMT Right  Left  Left 03/08/2022 Left 9/25/22023  Hip flexion  4+/5 5/5 5/5  Hip extension      Hip abduction      Knee flexion  5/5 5/5   Knee extension  4+/5 5/5   Ankle dorsiflexion  5/5    Ankle plantarflexion  5/5    Ankle inversion      Ankle eversion       (Blank rows = not tested)   FUNCTIONAL TESTS:  -Dead lift: Good form and depth with no pain    TODAY'S TREATMENT: [facility] Adult PT Treatment:                                                DATE: 08/06/2022 Therapeutic Exercise: Elliptical L 5, ramp L 10 x 6 min  Standing hamstring, quad stretch 1 x 30 sec Standing calf stretch 1 x 30 sec Reverse treadmill walking with treadmill off 3 x 59mn Step up/ down from bosu alternating L/R 2 x 20 Alternating L/R lunge touching back nee onto bosu 1 x 20 Squats on reverse bosu 2 x 15  Neuromuscular re-ed: SLS 4 x 30 sec in // SLS on bosu 4 x 30 sec    [facility] Adult PT Treatment:                                                DATE: 07/30/2022 Therapeutic Exercise: Recumbent bike L 5 x 6 min (seat lowered to lowest leve) Quad 2 x 30 seconds thomas test position. Hamstring  stretch 2 x 30 sec (supine with strap) LLE singe leg press 2 x 15 40# Standing hip abduction 2x 20 12.5# using hip cybex LLE hip hinge 2 x 15 10# KB  Manual Therapy: MTPR along the vastus lateralis DTM along the Vastus lateralis   [facility] Adult PT Treatment:  DATE: 07/23/2022 Therapeutic Exercise: Ellitpical L2 x 6 min ramp L2 Hamstring stretch standing 1 x 30 sec Standing quad stretch 1 x 30 sec Slant board gastroc stretch 2 x 30 Leg press bil LE 1 x 25 80# Dead lift 3 x 10 95#  Stool scoot 4 x 60 ft bil LE Walking lunge 2 x 15    PATIENT EDUCATION: Education details: HEP Person educated: Patient Education method: Consulting civil engineer, Demonstration, Corporate treasurer cues, Verbal cues Education comprehension: verbalized understanding, returned demonstration, verbal cues required, tactile cues required, and needs further education   HOME EXERCISE PROGRAM: Access Code: [email or web address] URL: [email or web address] Date: 08/06/2022 Prepared by: Starr Lake  Exercises - Wall Squat  - 1 x daily - 3-4 x weekly - 2-3 sets - 10 reps - Tandem Stance  - 1 x daily - 3-4 x weekly - 3 reps - 30 seconds hold - Single Leg Stance  - 1 x daily - 3-4 x weekly - 3 reps - 30 seconds hold - Side Stepping with Resistance at Feet  - 1 x daily - 7 x weekly - 2 sets - 10 reps - Static Lunge  - 1 x daily - 7 x weekly - 2 sets - 10 reps    ASSESSMENT: CLINICAL IMPRESSION: Dustin Parks is making good progress with physical therapy. Continued working on gross hip/ knee strengthening and progressing balance training from double to single leg and performing on an unstable surface. End of session he noted no pain or other issues.    GOALS: SHORT TERM GOALS:   STG Name Target Date Goal status  1 Pt will be independent and compliant with HEP for improved pain, strength, and ROM.  Baseline: New HEP administered 05/02/2022 05/10/2022: Achieved 06/13/2022 ACHIEVED  2 Pt will  demo a good quad set and perform supine SLRs independently with brace for improved quad strength. Baseline: Pain with quad setting/ SLR 05/10/2022: achieved 06/13/2022 ACHIEVED  3 Pt will demo improved improved L knee extension PROM/AROM to 0/3 and flexion PROM to 60 deg for improved mobility and stiffness. Baseline: -3 to 78 degrees AROM 07/17/2022: -4-122 AROM 06/13/2022 ACHIEVED  4 Pt will demo improved improved L knee extension AROM to 0 deg and  flexion AAROM/PROM to 80/90 deg for improved mobility and stiffness. Baseline: -3 to 78 degrees AROM 05/10/2022: 0-90 degrees 06/13/2022 ACHIEVED  5 Pt will demo L knee AROM to be 0 - 120 deg for improved stiffness and performance of functional mobility.   Baseline: -3 to 78 degrees AROM 07/17/2022: -4-122 AROM 06/13/2022 ACHIEVED  6 Pt will progress Wb'ing with gait per MD orders and protocol without adverse effects.  Baseline: 07/17/2022: Pt WBAT with hinge brace 06/13/2022 ACHIEVED  7 Pt will progress with closed chain exercises per protocol without adverse effects for improved functional strength and stability.  Baseline: 07/17/2022: ACHIEVED 06/13/2022 ACHIEVED  8 Pt will score 0-1 on the lateral step down test on a 4 inch step for improved quad eccentric control.   06/13/2022   MET 07/30/2022  9 Pt will score 0-1 on the lateral step down test on a 6 inch step for improved quad eccentric control and performance of stairs.   06/13/2022 MET 07/30/2022  10 Pt will ambulate with a normalized heel to toe gait without limping without AD Baseline: Pt ambulates with swing-through gait with axillary crutches 06/13/2022 MET 07/30/2022    LONG TERM GOALS:    LTG Name Target Date Goal status  1 Pt will be able to perform his ADLs/IADLs  and normal functional mobility skills without significant difficulty or pain.  Baseline: Pt reports some moderate difficulty with getting into bed and crouching at work 08/01/2022   MET 07/30/2022  2 Pt will ambulate extended community distance  without an AD without increased pain or difficulty.  Baseline: Unable to ambulate without crutches 08/01/2022 MET 07/30/2022  3 Pt will be able to perform his normal daily transfers without difficulty.  Baseline: moderate difficulty 08/01/2022 MET 07/30/2022  4 Pt will demo good squatting form, symmetrical Wb'ing, and good knee alignment without significant pain for improved functional strength and tolerance with IADLs.  Baseline: Unable to perform post-op 08/01/2022 Partially MET  07/30/2022  5 Pt will be able to perform stairs with a reciprocal gait without the rail with good control.  Baseline: Unable to perform post-op, uses crutches 08/01/2022 Partially MET  07/30/2022  6 Pt will be able to perform all of his school activities and work activities without significant pain or difficulty.   Baseline: unable 08/01/2022 Partially MET  07/30/2022  7 Pt will demo 5/5 L hip and knee strength for improved tolerance with and performance of functional mobility skills and to assist in returning to PLOF.   Baseline: 08/01/2022 Partially MET  07/30/2022     PLAN: PT FREQUENCY: 2-3x/week   PT DURATION: other: 12 weeks   PLANNED INTERVENTIONS: Therapeutic exercises, Therapeutic activity, Neuro Muscular re-education, Balance training, Gait training, Patient/Family education, Joint mobilization, Stair training, DME instructions, Aquatic Therapy, Electrical stimulation, Cryotherapy, Moist heat, Taping, and Manual therapy   PLAN FOR NEXT SESSION:  HEP, gross hip strengthening, balance training.    Josalyn Dettmann PT, DPT, LAT, ATC  08/06/22  3:49 PM

## 2022-08-14 ENCOUNTER — Ambulatory Visit: Payer: No Typology Code available for payment source | Admitting: Physical Therapy

## 2022-08-20 ENCOUNTER — Encounter (HOSPITAL_BASED_OUTPATIENT_CLINIC_OR_DEPARTMENT_OTHER): Payer: Self-pay | Admitting: Orthopaedic Surgery

## 2022-08-21 ENCOUNTER — Encounter: Payer: No Typology Code available for payment source | Admitting: Physical Therapy

## 2022-08-28 ENCOUNTER — Ambulatory Visit: Payer: No Typology Code available for payment source | Admitting: Physical Therapy

## 2022-08-28 ENCOUNTER — Encounter: Payer: Self-pay | Admitting: Physical Therapy

## 2022-08-28 DIAGNOSIS — M25562 Pain in left knee: Secondary | ICD-10-CM

## 2022-08-28 DIAGNOSIS — M25662 Stiffness of left knee, not elsewhere classified: Secondary | ICD-10-CM | POA: Diagnosis not present

## 2022-08-28 DIAGNOSIS — M6281 Muscle weakness (generalized): Secondary | ICD-10-CM
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
## 2022-09-04 ENCOUNTER — Encounter: Payer: Self-pay | Admitting: Physical Therapy

## 2022-09-04 ENCOUNTER — Ambulatory Visit: Payer: No Typology Code available for payment source | Admitting: Physical Therapy

## 2022-09-04 DIAGNOSIS — M25562 Pain in left knee: Secondary | ICD-10-CM

## 2022-09-04 DIAGNOSIS — M25662 Stiffness of left knee, not elsewhere classified: Secondary | ICD-10-CM

## 2022-09-04 DIAGNOSIS — M6281 Muscle weakness (generalized): Secondary | ICD-10-CM

## 2022-09-04 NOTE — Therapy (Signed)
OUTPATIENT PHYSICAL THERAPY TREATMENT NOTE   Patient Name: Dustin Parks MRN: [id number] DOB:2001/10/17, 21 y.o., male Today's Date: 09/04/2022  PCP: Haydee Salter, MD REFERRING PROVIDER: Dr Donivan Scull    PT End of Session - 09/04/22 1502     Visit Number 24    Number of Visits 64    Date for PT Re-Evaluation 10/22/22    Authorization Type [facility] [employer]    Authorization Time Period 08/01/2022 - 10/31/2022    Authorization - Visit Number 3    Authorization - Number of Visits 21    PT Start Time 1502    PT Stop Time 1547    PT Time Calculation (min) 45 min    Equipment Utilized During Treatment Left knee immobilizer    Activity Tolerance Patient tolerated treatment well    Behavior During Therapy WFL for tasks assessed/performed                    Past Medical History:  Diagnosis Date   Allergy    Asthma    out grown now    PONV (postoperative nausea and vomiting)    Past Surgical History:  Procedure Laterality Date   KNEE ARTHROSCOPY Left 02/02/2022   Procedure: LEFT ARTHROSCOPY KNEE LYSIS OF ADHESION AND MANIPULATION UNDER ANESTHESIA;  Surgeon: [name], MD;  Location: [place];  Service: Orthopedics;  Laterality: Left;   KNEE ARTHROSCOPY WITH ANTERIOR CRUCIATE LIGAMENT (ACL) REPAIR WITH HAMSTRING GRAFT Left 11/08/2021   Procedure: LEFT KNEE ANTERIOR CRUCIATE LIGAMENT RECONSTRUCTION WITH QUADRICEPS TENDON AUTOGRAFT;  Surgeon: [name], MD;  Location: [place];  Service: Orthopedics;  Laterality: Left;   KNEE ARTHROSCOPY WITH ANTERIOR CRUCIATE LIGAMENT (ACL) REPAIR WITH HAMSTRING GRAFT Left 04/30/2022   Procedure: LEFT KNEE ARTHROSCOPY WITH ANTERIOR CRUCIATE LIGAMENT (ACL) REPAIR WITH QUADRICEPS ALLOGRAFT AND LATERAL MENISCAL REPAIR;  Surgeon: [name], MD;  Location: [place];  Service: Orthopedics;  Laterality: Left;   KNEE ARTHROSCOPY WITH MENISCAL REPAIR  11/08/2021   Procedure: LEFT LATERAL MENISCAL REPAIR;   Surgeon: [name], MD;  Location: [place];  Service: Orthopedics;;   [place] EXTRACTION  2022   Patient Active Problem List   Diagnosis Date Noted   Peripheral tear of lateral meniscus of left knee as current injury    Arthrofibrosis of knee joint, left 01/31/2022   S/P repair of anterior cruciate ligament 01/31/2022   Morbid obesity with BMI of 40.0-44.9, adult ([place]) 01/31/2022   Hypertriglyceridemia 01/31/2022   Angioedema of lips- [place] 12/13/2021   Tear of anterior cruciate ligament graft ([place])    Complex tear of lateral meniscus of left knee as current injury    History of orchitis 02/08/2021     PCP: Libby Maw, MD   REFERRING PROVIDER: [name], MD   REFERRING DIAG:  [phone] (ICD-10-CM) - Rupture of anterior cruciate ligament of left knee, initial encounter  S83.272A (ICD-10-CM) - Complex tear of lateral meniscus of left knee as current injury, initial encounter Z98.890 (ICD-10-CM) - S/P left knee arthroscopy    THERAPY DIAG:  Left knee pain, unspecified chronicity   Stiffness of left knee, not elsewhere classified   Muscle weakness (generalized)   Difficulty in walking, not elsewhere classified    PERTINENT HISTORY: 11/08/2021 Left knee ACL reconstruction with quadriceps autograft and lateral meniscal repair.    02/02/2022: Lt knee lysis of adheresion and manipulation under anesthesia  Allergy to certain adhesives including tegaderm per pt report  Re-rupture of Lt ACL and Lt meniscal tear with subsequent repair on 04/30/2022   ONSET DATE: Injury 10/09/2021 / Surgery 11/08/2021; subsequent Lt ACL and meniscus tear and repair on 05/02/2022     SUBJECTIVE:  "I am doing better since the last session. I do still get some soreness but it is getting better."  PAIN:  Are you having pain?  NPRS scale: 1/10 at rest Pain location: Left knee  Pain orientation: Left PAIN TYPE: Aching Pain description: intermittent   Aggravating factors: use of the kne Relieving factors: rest     PRECAUTIONS: Other: per Dr. Eddie Dibbles ACL reconstruction with meniscal repair   WEIGHT BEARING RESTRICTIONS Yes   PLOF: Independent; Pt was able to perform all of his ADLs/IADLs and functional mobility skills without difficulty or limitations.  Pt was able to perform work activities.  Pt ambulated without an AD with good stability without difficulty.    PATIENT GOALS improved stability with leaning, to have the ability to run, to be able to bowl.  Return to PLOF.      OBJECTIVE:  *Unless otherwise noted, objective measures collected previously*  LE ROM:  A/PROM Left 02/03/2022 Left 05/02/2022 Left 05/04/2022 Left 05/07/2022 Left 05/14/2022 Left 07/17/2022  Knee flexion 93 AROM/ 97 AAROM/ 103PROM/ 106 PROM following MET 78 AROM, 81 AAROM with strap 82 AROM, 86 AAROM with strap 90 deg AROM heel slide 100 deg 122 with brace donned  Knee extension -1 AROM/ 0 PROM -3 AROM  4 hyper     (Blank rows = not tested)  LE MMT:  MMT Right  Left  Left 03/08/2022 Left 9/25/22023  Hip flexion  4+/5 5/5 5/5  Hip extension      Hip abduction      Knee flexion  5/5 5/5   Knee extension  4+/5 5/5   Ankle dorsiflexion  5/5    Ankle plantarflexion  5/5    Ankle inversion      Ankle eversion       (Blank rows = not tested)   FUNCTIONAL TESTS:  -Dead lift: Good form and depth with no pain    TODAY'S TREATMENT:  [facility] Adult PT Treatment:                                                DATE: 09/04/2022 Therapeutic Exercise: Elliptical L 10 , ramp L 5 x 7 min  Standing quad stretch 2 x 30  Standing hamstring stretch  2 x 30 Standing reverse bosu squat 2 x 15 in // Lunges 2 x 10 bil in //, fatigued quickly and focused on eccentric lowering and required use of  Neuromuscular re-ed: LLE 3 x 30 sec on reverse bosu Therapeutic Activity: Pre-jumping activity in front of the mirror squat into heel raise 2 x 10 - verbal cues for landing  avoiding weight shift to the R which he challenge Mini jumps in front mirror 2 x 10     [facility] Adult PT Treatment:                                                DATE: 08/28/2022 Therapeutic Exercise: Recumbent bike L 5 x 6 min  Hamstring stretch 2 x 30 sec strap  Prone quad stretch 2 x 30 sec with strap LLE single leg bridge 3 x 10 SLR LLE 3 x 15 with maintained quad quad set L hip abduction in R sidelying 3 x 20 with 5# Bridge with heels on black foam roll 1 x 30 to promote hamstring activation Manual Therapy: IASTM and XFM along patellar tendon    [facility] Adult PT Treatment:                                                DATE: 08/06/2022 Therapeutic Exercise: Elliptical L 5, ramp L 10 x 6 min  Standing hamstring, quad stretch 1 x 30 sec Standing calf stretch 1 x 30 sec Reverse treadmill walking with treadmill off 3 x 63mn Step up/ down from bosu alternating L/R 2 x 20 Alternating L/R lunge touching back nee onto bosu 1 x 20 Squats on reverse bosu 2 x 15  Neuromuscular re-ed: SLS 4 x 30 sec in // SLS on bosu 4 x 30 sec     PATIENT EDUCATION: 08/28/2022 Education details: Reviewed HEP and discussed resuming  Person educated: Patient Education method: Explanation, Demonstration, Tactile cues, Verbal cues Education comprehension: verbalized understanding, returned demonstration, verbal cues required, tactile cues required, and needs further education   HOME EXERCISE PROGRAM: Access Code: [email or web address]: [email or web address] Date: 09/04/2022 Prepared by: KStarr Lake Exercises - Wall Squat  - 1 x daily - 3-4 x weekly - 2-3 sets - 10 reps - Tandem Stance  - 1 x daily - 3-4 x weekly - 3 reps - 30 seconds hold - Single Leg Stance  - 1 x daily - 3-4 x weekly - 3 reps - 30 seconds hold - Side Stepping with Resistance at Feet  - 1 x daily - 7 x weekly - 2 sets - 10 reps - Static Lunge  - 1 x daily - 7 x weekly - 2 sets - 10 reps -mini jumps      ASSESSMENT: CLINICAL IMPRESSION: BAndrusreports to PT today reporting improvement of pain since the last session rated at 1/10. Continued working on LE strengthening with increased reps. Focused remainder of session practicing jumping / landing mechanics which he required verbal/ visual cues for proper form and avoid compensation. Updated HEP today to add jumping in places avoiding compensation. End of session he reported no pain.    GOALS: SHORT TERM GOALS:   STG Name Target Date Goal status  1 Pt will be independent and compliant with HEP for improved pain, strength, and ROM.  Baseline: New HEP administered 05/02/2022 05/10/2022: Achieved 06/13/2022 ACHIEVED  2 Pt will demo a good quad set and perform supine SLRs independently with brace for improved quad strength. Baseline: Pain with quad setting/ SLR 05/10/2022: achieved 06/13/2022 ACHIEVED  3 Pt will demo improved improved L knee extension PROM/AROM to 0/3 and flexion PROM to 60 deg for improved mobility and stiffness. Baseline: -3 to 78 degrees AROM 07/17/2022: -4-122 AROM 06/13/2022 ACHIEVED  4 Pt will demo improved improved L knee extension AROM to 0 deg and  flexion AAROM/PROM to 80/90 deg for improved mobility and stiffness. Baseline: -3 to 78 degrees AROM 05/10/2022: 0-90 degrees 06/13/2022 ACHIEVED  5 Pt will demo L knee AROM to be 0 - 120 deg for improved stiffness and performance of functional mobility.   Baseline: -3 to 78 degrees AROM 07/17/2022: -4-122  AROM 06/13/2022 ACHIEVED  6 Pt will progress Wb'ing with gait per MD orders and protocol without adverse effects.  Baseline: 07/17/2022: Pt WBAT with hinge brace 06/13/2022 ACHIEVED  7 Pt will progress with closed chain exercises per protocol without adverse effects for improved functional strength and stability.  Baseline: 07/17/2022: ACHIEVED 06/13/2022 ACHIEVED  8 Pt will score 0-1 on the lateral step down test on a 4 inch step for improved quad eccentric control.   06/13/2022   MET 07/30/2022   9 Pt will score 0-1 on the lateral step down test on a 6 inch step for improved quad eccentric control and performance of stairs.   06/13/2022 MET 07/30/2022  10 Pt will ambulate with a normalized heel to toe gait without limping without AD Baseline: Pt ambulates with swing-through gait with axillary crutches 06/13/2022 MET 07/30/2022    LONG TERM GOALS:    LTG Name Target Date Goal status  1 Pt will be able to perform his ADLs/IADLs and normal functional mobility skills without significant difficulty or pain.  Baseline: Pt reports some moderate difficulty with getting into bed and crouching at work 08/01/2022   MET 07/30/2022  2 Pt will ambulate extended community distance without an AD without increased pain or difficulty.  Baseline: Unable to ambulate without crutches 08/01/2022 MET 07/30/2022  3 Pt will be able to perform his normal daily transfers without difficulty.  Baseline: moderate difficulty 08/01/2022 MET 07/30/2022  4 Pt will demo good squatting form, symmetrical Wb'ing, and good knee alignment without significant pain for improved functional strength and tolerance with IADLs.  Baseline: Unable to perform post-op 10/05/2022 Partially MET  08/28/2022  5 Pt will be able to perform stairs with a reciprocal gait without the rail with good control.  Baseline: Unable to perform post-op, uses crutches 10/05/2022 Partially MET  08/28/2022  6 Pt will be able to perform all of his school activities and work activities without significant pain or difficulty.   Baseline: unable 10/05/2022 Partially MET  08/28/2022  7 Pt will demo 5/5 L hip and knee strength for improved tolerance with and performance of functional mobility skills and to assist in returning to PLOF.   Baseline: 10/05/2022 Partially MET  08/28/2022     PLAN: PT FREQUENCY: 2-3x/week   PT DURATION: other: 12 weeks   PLANNED INTERVENTIONS: Therapeutic exercises, Therapeutic activity, Neuro Muscular re-education, Balance training, Gait  training, Patient/Family education, Joint mobilization, Stair training, DME instructions, Aquatic Therapy, Electrical stimulation, Cryotherapy, Moist heat, Taping, and Manual therapy   PLAN FOR NEXT SESSION:  HEP, gross hip strengthening, balance training.    Jaydan Meidinger PT, DPT, LAT, ATC  09/04/22  3:54 PM

## 2022-09-11 ENCOUNTER — Ambulatory Visit: Payer: No Typology Code available for payment source | Admitting: Physical Therapy

## 2022-09-14 ENCOUNTER — Ambulatory Visit: Payer: No Typology Code available for payment source | Attending: Family Medicine

## 2022-09-14 DIAGNOSIS — M25662 Stiffness of left knee, not elsewhere classified: Secondary | ICD-10-CM

## 2022-09-14 DIAGNOSIS — M6281 Muscle weakness (generalized): Secondary | ICD-10-CM | POA: Diagnosis present

## 2022-09-14 DIAGNOSIS — M25562 Pain in left knee: Secondary | ICD-10-CM | POA: Diagnosis present

## 2022-09-14 DIAGNOSIS — R262 Difficulty in walking, not elsewhere classified: Secondary | ICD-10-CM

## 2022-09-14 NOTE — Therapy (Signed)
OUTPATIENT PHYSICAL THERAPY TREATMENT NOTE   Patient Name: Dustin Parks MRN: [id number] DOB:October 11, 2001, 21 y.o., male Today's Date: 09/14/2022  PCP: Haydee Salter, MD REFERRING PROVIDER: Dr Donivan Scull    PT End of Session - 09/14/22 0912     Visit Number 56    Number of Visits 45    Date for PT Re-Evaluation 10/22/22    Authorization Type [facility] [employer]    Authorization Time Period 08/01/2022 - 10/31/2022    Authorization - Visit Number 4    Authorization - Number of Visits 21    PT Start Time 0914    PT Stop Time 0954    PT Time Calculation (min) 40 min    Activity Tolerance Patient tolerated treatment well    Behavior During Therapy [facility] for tasks assessed/performed                     Past Medical History:  Diagnosis Date   Allergy    Asthma    out grown now    PONV (postoperative nausea and vomiting)    Past Surgical History:  Procedure Laterality Date   KNEE ARTHROSCOPY Left 02/02/2022   Procedure: LEFT ARTHROSCOPY KNEE LYSIS OF ADHESION AND MANIPULATION UNDER ANESTHESIA;  Surgeon: [name], MD;  Location: [place];  Service: Orthopedics;  Laterality: Left;   KNEE ARTHROSCOPY WITH ANTERIOR CRUCIATE LIGAMENT (ACL) REPAIR WITH HAMSTRING GRAFT Left 11/08/2021   Procedure: LEFT KNEE ANTERIOR CRUCIATE LIGAMENT RECONSTRUCTION WITH QUADRICEPS TENDON AUTOGRAFT;  Surgeon: [name], MD;  Location: [place];  Service: Orthopedics;  Laterality: Left;   KNEE ARTHROSCOPY WITH ANTERIOR CRUCIATE LIGAMENT (ACL) REPAIR WITH HAMSTRING GRAFT Left 04/30/2022   Procedure: LEFT KNEE ARTHROSCOPY WITH ANTERIOR CRUCIATE LIGAMENT (ACL) REPAIR WITH QUADRICEPS ALLOGRAFT AND LATERAL MENISCAL REPAIR;  Surgeon: [name], MD;  Location: [place];  Service: Orthopedics;  Laterality: Left;   KNEE ARTHROSCOPY WITH MENISCAL REPAIR  11/08/2021   Procedure: LEFT LATERAL MENISCAL REPAIR;  Surgeon: [name], MD;  Location: [place];  Service: Orthopedics;;   [place] EXTRACTION  2022   Patient Active Problem List   Diagnosis Date Noted   Peripheral tear of lateral meniscus of left knee as current injury    Arthrofibrosis of knee joint, left 01/31/2022   S/P repair of anterior cruciate ligament 01/31/2022   Morbid obesity with BMI of 40.0-44.9, adult ([place]) 01/31/2022   Hypertriglyceridemia 01/31/2022   Angioedema of lips- [place] 12/13/2021   Tear of anterior cruciate ligament graft ([place])    Complex tear of lateral meniscus of left knee as current injury    History of orchitis 02/08/2021     PCP: Libby Maw, MD   REFERRING PROVIDER: [name], MD   REFERRING DIAG:  [phone] (ICD-10-CM) - Rupture of anterior cruciate ligament of left knee, initial encounter  S83.272A (ICD-10-CM) - Complex tear of lateral meniscus of left knee as current injury, initial encounter Z98.890 (ICD-10-CM) - S/P left knee arthroscopy    THERAPY DIAG:  Left knee pain, unspecified chronicity   Stiffness of left knee, not elsewhere classified   Muscle weakness (generalized)   Difficulty in walking, not elsewhere classified    PERTINENT HISTORY: 11/08/2021 Left knee ACL reconstruction with quadriceps autograft and lateral meniscal repair.    02/02/2022: Lt knee lysis of adheresion and manipulation under anesthesia  Allergy to certain adhesives including tegaderm per pt report   Re-rupture of Lt ACL and Lt meniscal tear with  subsequent repair on 04/30/2022   ONSET DATE: Injury 10/09/2021 / Surgery 11/08/2021; subsequent Lt ACL and meniscus tear and repair on 05/02/2022     SUBJECTIVE:  Pt reports continued soreness with plyometric exercises. He also reports continued painless popping in his Lt knee. He also reports 1-2/10 pain with quad stretching.   PAIN:  Are you having pain?  NPRS scale: 0/10 at rest Pain location: Left knee  Pain orientation: Left PAIN TYPE: Aching Pain description:  intermittent  Aggravating factors: use of the kne Relieving factors: rest     PRECAUTIONS: Other: per Dr. Eddie Dibbles ACL reconstruction with meniscal repair   WEIGHT BEARING RESTRICTIONS Yes   PLOF: Independent; Pt was able to perform all of his ADLs/IADLs and functional mobility skills without difficulty or limitations.  Pt was able to perform work activities.  Pt ambulated without an AD with good stability without difficulty.    PATIENT GOALS improved stability with leaning, to have the ability to run, to be able to bowl.  Return to PLOF.      OBJECTIVE:  *Unless otherwise noted, objective measures collected previously*  LE ROM:  A/PROM Left 02/03/2022 Left 05/02/2022 Left 05/04/2022 Left 05/07/2022 Left 05/14/2022 Left 07/17/2022  Knee flexion 93 AROM/ 97 AAROM/ 103PROM/ 106 PROM following MET 78 AROM, 81 AAROM with strap 82 AROM, 86 AAROM with strap 90 deg AROM heel slide 100 deg 122 with brace donned  Knee extension -1 AROM/ 0 PROM -3 AROM  4 hyper     (Blank rows = not tested)  LE MMT:  MMT Right  Left  Left 03/08/2022 Left 9/25/22023  Hip flexion  4+/5 5/5 5/5  Hip extension      Hip abduction      Knee flexion  5/5 5/5   Knee extension  4+/5 5/5   Ankle dorsiflexion  5/5    Ankle plantarflexion  5/5    Ankle inversion      Ankle eversion       (Blank rows = not tested)   FUNCTIONAL TESTS:  -Dead lift: Good form and depth with no pain    TODAY'S TREATMENT:  [facility] Adult PT Treatment:                                                DATE: 09/14/2022 Therapeutic Exercise: Standing Lt knee chair quad stretch 2x48mn with UE support [place] squat hops with 10# kettlebell in contralateral hand and UE support on ipsilateral side 3x10 [place] hop ladder drill x3 laps 2 in, 1 out ladder drill x3 laps 6-inch box drop squat with mirror feedback 2x10 Manual Therapy: N/A Neuromuscular re-ed: N/A Therapeutic Activity: N/A Modalities: N/A Self Care: N/A   [facility]  Adult PT Treatment:                                                DATE: 09/04/2022 Therapeutic Exercise: Elliptical L 10 , ramp L 5 x 7 min  Standing quad stretch 2 x 30  Standing hamstring stretch  2 x 30 Standing reverse bosu squat 2 x 15 in // Lunges 2 x 10 bil in //, fatigued quickly and focused on eccentric lowering and required use of  Neuromuscular re-ed: LLE 3  x 30 sec on reverse bosu Therapeutic Activity: Pre-jumping activity in front of the mirror squat into heel raise 2 x 10 - verbal cues for landing avoiding weight shift to the R which he challenge Mini jumps in front mirror 2 x 10     [facility] Adult PT Treatment:                                                DATE: 08/28/2022 Therapeutic Exercise: Recumbent bike L 5 x 6 min  Hamstring stretch 2 x 30 sec strap Prone quad stretch 2 x 30 sec with strap LLE single leg bridge 3 x 10 SLR LLE 3 x 15 with maintained quad quad set L hip abduction in R sidelying 3 x 20 with 5# Bridge with heels on black foam roll 1 x 30 to promote hamstring activation Manual Therapy: IASTM and XFM along patellar tendon       PATIENT EDUCATION: 08/28/2022 Education details: Reviewed HEP and discussed resuming  Person educated: Patient Education method: Explanation, Demonstration, Tactile cues, Verbal cues Education comprehension: verbalized understanding, returned demonstration, verbal cues required, tactile cues required, and needs further education   HOME EXERCISE PROGRAM: Access Code: [email or web address] URL: [email or web address] Date: 09/04/2022 Prepared by: Starr Lake  Exercises - Wall Squat  - 1 x daily - 3-4 x weekly - 2-3 sets - 10 reps - Tandem Stance  - 1 x daily - 3-4 x weekly - 3 reps - 30 seconds hold - Single Leg Stance  - 1 x daily - 3-4 x weekly - 3 reps - 30 seconds hold - Side Stepping with Resistance at Feet  - 1 x daily - 7 x weekly - 2 sets - 10 reps - Static Lunge  - 1 x daily - 7 x weekly - 2 sets - 10  reps -mini jumps     ASSESSMENT: CLINICAL IMPRESSION: Pt responded well to all interventions today, demonstrating improved control with loaded plyometric exercises. He will continue to benefit from skilled PT to address his primary impairments and return to his prior level of function with less limitation.    GOALS: SHORT TERM GOALS:   STG Name Target Date Goal status  1 Pt will be independent and compliant with HEP for improved pain, strength, and ROM.  Baseline: New HEP administered 05/02/2022 05/10/2022: Achieved 06/13/2022 ACHIEVED  2 Pt will demo a good quad set and perform supine SLRs independently with brace for improved quad strength. Baseline: Pain with quad setting/ SLR 05/10/2022: achieved 06/13/2022 ACHIEVED  3 Pt will demo improved improved L knee extension PROM/AROM to 0/3 and flexion PROM to 60 deg for improved mobility and stiffness. Baseline: -3 to 78 degrees AROM 07/17/2022: -4-122 AROM 06/13/2022 ACHIEVED  4 Pt will demo improved improved L knee extension AROM to 0 deg and  flexion AAROM/PROM to 80/90 deg for improved mobility and stiffness. Baseline: -3 to 78 degrees AROM 05/10/2022: 0-90 degrees 06/13/2022 ACHIEVED  5 Pt will demo L knee AROM to be 0 - 120 deg for improved stiffness and performance of functional mobility.   Baseline: -3 to 78 degrees AROM 07/17/2022: -4-122 AROM 06/13/2022 ACHIEVED  6 Pt will progress Wb'ing with gait per MD orders and protocol without adverse effects.  Baseline: 07/17/2022: Pt WBAT with hinge brace 06/13/2022 ACHIEVED  7 Pt will progress with closed chain exercises per protocol without  adverse effects for improved functional strength and stability.  Baseline: 07/17/2022: ACHIEVED 06/13/2022 ACHIEVED  8 Pt will score 0-1 on the lateral step down test on a 4 inch step for improved quad eccentric control.   06/13/2022   MET 07/30/2022  9 Pt will score 0-1 on the lateral step down test on a 6 inch step for improved quad eccentric control and performance of  stairs.   06/13/2022 MET 07/30/2022  10 Pt will ambulate with a normalized heel to toe gait without limping without AD Baseline: Pt ambulates with swing-through gait with axillary crutches 06/13/2022 MET 07/30/2022    LONG TERM GOALS:    LTG Name Target Date Goal status  1 Pt will be able to perform his ADLs/IADLs and normal functional mobility skills without significant difficulty or pain.  Baseline: Pt reports some moderate difficulty with getting into bed and crouching at work 08/01/2022   MET 07/30/2022  2 Pt will ambulate extended community distance without an AD without increased pain or difficulty.  Baseline: Unable to ambulate without crutches 08/01/2022 MET 07/30/2022  3 Pt will be able to perform his normal daily transfers without difficulty.  Baseline: moderate difficulty 08/01/2022 MET 07/30/2022  4 Pt will demo good squatting form, symmetrical Wb'ing, and good knee alignment without significant pain for improved functional strength and tolerance with IADLs.  Baseline: Unable to perform post-op 10/05/2022 Partially MET  08/28/2022  5 Pt will be able to perform stairs with a reciprocal gait without the rail with good control.  Baseline: Unable to perform post-op, uses crutches 10/05/2022 Partially MET  08/28/2022  6 Pt will be able to perform all of his school activities and work activities without significant pain or difficulty.   Baseline: unable 10/05/2022 Partially MET  08/28/2022  7 Pt will demo 5/5 L hip and knee strength for improved tolerance with and performance of functional mobility skills and to assist in returning to PLOF.   Baseline: 10/05/2022 Partially MET  08/28/2022     PLAN: PT FREQUENCY: 2-3x/week   PT DURATION: other: 12 weeks   PLANNED INTERVENTIONS: Therapeutic exercises, Therapeutic activity, Neuro Muscular re-education, Balance training, Gait training, Patient/Family education, Joint mobilization, Stair training, DME instructions, Aquatic Therapy, Electrical  stimulation, Cryotherapy, Moist heat, Taping, and Manual therapy   PLAN FOR NEXT SESSION:  HEP, gross hip strengthening, balance training.    Vanessa Mariposa, PT, DPT 09/14/22 9:55 AM

## 2022-09-18 ENCOUNTER — Ambulatory Visit: Payer: No Typology Code available for payment source | Admitting: Physical Therapy

## 2022-09-18 ENCOUNTER — Encounter: Payer: Self-pay | Admitting: Physical Therapy

## 2022-09-18 DIAGNOSIS — M25662 Stiffness of left knee, not elsewhere classified: Secondary | ICD-10-CM

## 2022-09-18 DIAGNOSIS — M6281 Muscle weakness (generalized): Secondary | ICD-10-CM

## 2022-09-18 DIAGNOSIS — M25562 Pain in left knee: Secondary | ICD-10-CM

## 2022-09-18 NOTE — Therapy (Signed)
OUTPATIENT PHYSICAL THERAPY TREATMENT NOTE   Patient Name: Dustin Parks MRN: [id number] DOB:[birth year], 21 y.o., male Today's Date: 09/18/2022  PCP: Haydee Salter, MD REFERRING PROVIDER: Dr Donivan Scull    PT End of Session - 09/18/22 1504     Visit Number 91    Number of Visits 64    Date for PT Re-Evaluation 10/22/22    Authorization Type [employer]    Authorization Time Period 08/01/2022 - 10/31/2022    Authorization - Visit Number 5    Authorization - Number of Visits 21    PT Start Time 1501    PT Stop Time 1545    PT Time Calculation (min) 44 min    Activity Tolerance Patient tolerated treatment well    Behavior During Therapy WFL for tasks assessed/performed                     Past Medical History:  Diagnosis Date   Allergy    Asthma    out grown now    PONV (postoperative nausea and vomiting)    Past Surgical History:  Procedure Laterality Date   KNEE ARTHROSCOPY Left 02/02/2022   Procedure: LEFT ARTHROSCOPY KNEE LYSIS OF ADHESION AND MANIPULATION UNDER ANESTHESIA;  Surgeon: [name], MD;  Location: [place];  Service: Orthopedics;  Laterality: Left;   KNEE ARTHROSCOPY WITH ANTERIOR CRUCIATE LIGAMENT (ACL) REPAIR WITH HAMSTRING GRAFT Left 11/08/2021   Procedure: LEFT KNEE ANTERIOR CRUCIATE LIGAMENT RECONSTRUCTION WITH QUADRICEPS TENDON AUTOGRAFT;  Surgeon: [name], MD;  Location: [place];  Service: Orthopedics;  Laterality: Left;   KNEE ARTHROSCOPY WITH ANTERIOR CRUCIATE LIGAMENT (ACL) REPAIR WITH HAMSTRING GRAFT Left 04/30/2022   Procedure: LEFT KNEE ARTHROSCOPY WITH ANTERIOR CRUCIATE LIGAMENT (ACL) REPAIR WITH QUADRICEPS ALLOGRAFT AND LATERAL MENISCAL REPAIR;  Surgeon: [name], MD;  Location: [place];  Service: Orthopedics;  Laterality: Left;   KNEE ARTHROSCOPY WITH MENISCAL REPAIR  11/08/2021   Procedure: LEFT LATERAL MENISCAL REPAIR;  Surgeon: [name], MD;  Location: [place];  Service: Orthopedics;;   [place] EXTRACTION  2022   Patient Active Problem List   Diagnosis Date Noted   Peripheral tear of lateral meniscus of left knee as current injury    Arthrofibrosis of knee joint, left 01/31/2022   S/P repair of anterior cruciate ligament 01/31/2022   Morbid obesity with BMI of 40.0-44.9, adult ([place]) 01/31/2022   Hypertriglyceridemia 01/31/2022   Angioedema of lips- [place] 12/13/2021   Tear of anterior cruciate ligament graft ([place])    Complex tear of lateral meniscus of left knee as current injury    History of orchitis 02/08/2021     PCP: Libby Maw, MD   REFERRING PROVIDER: [name], MD   REFERRING DIAG:  [phone] (ICD-10-CM) - Rupture of anterior cruciate ligament of left knee, initial encounter  S83.272A (ICD-10-CM) - Complex tear of lateral meniscus of left knee as current injury, initial encounter Z98.890 (ICD-10-CM) - S/P left knee arthroscopy    THERAPY DIAG:  Left knee pain, unspecified chronicity   Stiffness of left knee, not elsewhere classified   Muscle weakness (generalized)   Difficulty in walking, not elsewhere classified    PERTINENT HISTORY: 11/08/2021 Left knee ACL reconstruction with quadriceps autograft and lateral meniscal repair.    02/02/2022: Lt knee lysis of adheresion and manipulation under anesthesia  Allergy to certain adhesives including tegaderm per pt report   Re-rupture of Lt ACL and Lt meniscal tear with  subsequent repair on 04/30/2022   ONSET DATE: Injury 10/09/2021 / Surgery 11/08/2021; subsequent Lt ACL and meniscus tear and repair on 05/02/2022     SUBJECTIVE:  "I am doing pretty good. I went to the gym since the last visit."  PAIN:  Are you having pain?  NPRS scale: 0/10 at rest Pain location: Left knee  Pain orientation: Left PAIN TYPE: Aching Pain description: intermittent  Aggravating factors: use of the kne Relieving factors: rest     PRECAUTIONS: Other: per  Dr. Eddie Dibbles ACL reconstruction with meniscal repair   WEIGHT BEARING RESTRICTIONS Yes   PLOF: Independent; Pt was able to perform all of his ADLs/IADLs and functional mobility skills without difficulty or limitations.  Pt was able to perform work activities.  Pt ambulated without an AD with good stability without difficulty.    PATIENT GOALS improved stability with leaning, to have the ability to run, to be able to bowl.  Return to PLOF.      OBJECTIVE:  *Unless otherwise noted, objective measures collected previously*  LE ROM:  A/PROM Left 02/03/2022 Left 05/02/2022 Left 05/04/2022 Left 05/07/2022 Left 05/14/2022 Left 07/17/2022  Knee flexion 93 AROM/ 97 AAROM/ 103PROM/ 106 PROM following MET 78 AROM, 81 AAROM with strap 82 AROM, 86 AAROM with strap 90 deg AROM heel slide 100 deg 122 with brace donned  Knee extension -1 AROM/ 0 PROM -3 AROM  4 hyper     (Blank rows = not tested)  LE MMT:  MMT Right  Left  Left 03/08/2022 Left 9/25/22023  Hip flexion  4+/5 5/5 5/5  Hip extension      Hip abduction      Knee flexion  5/5 5/5   Knee extension  4+/5 5/5   Ankle dorsiflexion  5/5    Ankle plantarflexion  5/5    Ankle inversion      Ankle eversion       (Blank rows = not tested)   FUNCTIONAL TESTS:  -Dead lift: Good form and depth with no pain    TODAY'S TREATMENT:  [facility] Adult PT Treatment:                                                DATE: 09/18/2022 Therapeutic Exercise: Stepper L5 x 5 min  Standing calf stretch 2 x 30 sec Standing hamstring stretch 2 x 30 sec, standing quad stretch 2 x 30 sec [place] split squat 2 x 12 Step up on 12 inch step 2 x 15  Neuromuscular re-ed: Rebounder SLS on airex pad forward   Therapeutic Activity: Forward hopping ladder x 4 bil LE Forward hopping SL forward x 4 Attempted single leg hops which he demonstrated increased apprehension performed them in the // to fatigue.     [facility] Adult PT Treatment:                                                 DATE: 09/14/2022 Therapeutic Exercise: Standing Lt knee chair quad stretch 2x52mn with UE support [place] squat hops with 10# kettlebell in contralateral hand and UE support on ipsilateral side 3x10 Slalom hop ladder drill x3 laps 2 in, 1 out ladder drill x3 laps 6-inch box drop squat with  mirror feedback 2x10 Manual Therapy: N/A Neuromuscular re-ed: N/A Therapeutic Activity: N/A Modalities: N/A Self Care: N/A   [facility] Adult PT Treatment:                                                DATE: 09/04/2022 Therapeutic Exercise: Elliptical L 10 , ramp L 5 x 7 min  Standing quad stretch 2 x 30  Standing hamstring stretch  2 x 30 Standing reverse bosu squat 2 x 15 in // Lunges 2 x 10 bil in //, fatigued quickly and focused on eccentric lowering and required use of  Neuromuscular re-ed: LLE 3 x 30 sec on reverse bosu Therapeutic Activity: Pre-jumping activity in front of the mirror squat into heel raise 2 x 10 - verbal cues for landing avoiding weight shift to the R which he challenge Mini jumps in front mirror 2 x 10      PATIENT EDUCATION: 08/28/2022 Education details: Reviewed HEP and discussed resuming  Person educated: Patient Education method: Explanation, Demonstration, Tactile cues, Verbal cues Education comprehension: verbalized understanding, returned demonstration, verbal cues required, tactile cues required, and needs further education   HOME EXERCISE PROGRAM: Access Code: [email or web address] URL: [email or web address] Date: 09/04/2022 Prepared by: Starr Lake  Exercises - Wall Squat  - 1 x daily - 3-4 x weekly - 2-3 sets - 10 reps - Tandem Stance  - 1 x daily - 3-4 x weekly - 3 reps - 30 seconds hold - Single Leg Stance  - 1 x daily - 3-4 x weekly - 3 reps - 30 seconds hold - Side Stepping with Resistance at Feet  - 1 x daily - 7 x weekly - 2 sets - 10 reps - Static Lunge  - 1 x daily - 7 x weekly - 2 sets - 10 reps -mini jumps      ASSESSMENT: CLINICAL IMPRESSION: Pt arrives to session noting that he has started going to the gym since the last session. Continued working on LE strengthening progressing to SLS as much as tolerated. Continued working on gradual jumping progression which he did well with double limb forward jumping but demonstrates apprehension with single leg. End of session she noted no increase in pain or issues.    GOALS: SHORT TERM GOALS:   STG Name Target Date Goal status  1 Pt will be independent and compliant with HEP for improved pain, strength, and ROM.  Baseline: New HEP administered 05/02/2022 05/10/2022: Achieved 06/13/2022 ACHIEVED  2 Pt will demo a good quad set and perform supine SLRs independently with brace for improved quad strength. Baseline: Pain with quad setting/ SLR 05/10/2022: achieved 06/13/2022 ACHIEVED  3 Pt will demo improved improved L knee extension PROM/AROM to 0/3 and flexion PROM to 60 deg for improved mobility and stiffness. Baseline: -3 to 78 degrees AROM 07/17/2022: -4-122 AROM 06/13/2022 ACHIEVED  4 Pt will demo improved improved L knee extension AROM to 0 deg and  flexion AAROM/PROM to 80/90 deg for improved mobility and stiffness. Baseline: -3 to 78 degrees AROM 05/10/2022: 0-90 degrees 06/13/2022 ACHIEVED  5 Pt will demo L knee AROM to be 0 - 120 deg for improved stiffness and performance of functional mobility.   Baseline: -3 to 78 degrees AROM 07/17/2022: -4-122 AROM 06/13/2022 ACHIEVED  6 Pt will progress Wb'ing with gait per MD orders and protocol without adverse effects.  Baseline:  07/17/2022: Pt WBAT with hinge brace 06/13/2022 ACHIEVED  7 Pt will progress with closed chain exercises per protocol without adverse effects for improved functional strength and stability.  Baseline: 07/17/2022: ACHIEVED 06/13/2022 ACHIEVED  8 Pt will score 0-1 on the lateral step down test on a 4 inch step for improved quad eccentric control.   06/13/2022   MET 07/30/2022  9 Pt will score 0-1 on  the lateral step down test on a 6 inch step for improved quad eccentric control and performance of stairs.   06/13/2022 MET 07/30/2022  10 Pt will ambulate with a normalized heel to toe gait without limping without AD Baseline: Pt ambulates with swing-through gait with axillary crutches 06/13/2022 MET 07/30/2022    LONG TERM GOALS:    LTG Name Target Date Goal status  1 Pt will be able to perform his ADLs/IADLs and normal functional mobility skills without significant difficulty or pain.  Baseline: Pt reports some moderate difficulty with getting into bed and crouching at work 08/01/2022   MET 07/30/2022  2 Pt will ambulate extended community distance without an AD without increased pain or difficulty.  Baseline: Unable to ambulate without crutches 08/01/2022 MET 07/30/2022  3 Pt will be able to perform his normal daily transfers without difficulty.  Baseline: moderate difficulty 08/01/2022 MET 07/30/2022  4 Pt will demo good squatting form, symmetrical Wb'ing, and good knee alignment without significant pain for improved functional strength and tolerance with IADLs.  Baseline: Unable to perform post-op 10/05/2022 Partially MET  08/28/2022  5 Pt will be able to perform stairs with a reciprocal gait without the rail with good control.  Baseline: Unable to perform post-op, uses crutches 10/05/2022 Partially MET  08/28/2022  6 Pt will be able to perform all of his school activities and work activities without significant pain or difficulty.   Baseline: unable 10/05/2022 Partially MET  08/28/2022  7 Pt will demo 5/5 L hip and knee strength for improved tolerance with and performance of functional mobility skills and to assist in returning to PLOF.   Baseline: 10/05/2022 Partially MET  08/28/2022     PLAN: PT FREQUENCY: 2-3x/week   PT DURATION: other: 12 weeks   PLANNED INTERVENTIONS: Therapeutic exercises, Therapeutic activity, Neuro Muscular re-education, Balance training, Gait training, Patient/Family  education, Joint mobilization, Stair training, DME instructions, Aquatic Therapy, Electrical stimulation, Cryotherapy, Moist heat, Taping, and Manual therapy   PLAN FOR NEXT SESSION:  HEP, gross hip strengthening, balance training.   Broden Holt PT, DPT, LAT, ATC  09/18/22  3:55 PM

## 2022-09-25 ENCOUNTER — Ambulatory Visit: Payer: No Typology Code available for payment source | Admitting: Physical Therapy

## 2022-09-25 NOTE — Therapy (Incomplete)
OUTPATIENT PHYSICAL THERAPY TREATMENT NOTE   Patient Name: Dustin Parks MRN: [id number] DOB:[birth year], 21 y.o., male Today's Date: 09/25/2022  PCP: Haydee Salter, MD REFERRING PROVIDER: Dr Donivan Scull              Past Medical History:  Diagnosis Date   Allergy    Asthma    out grown now    PONV (postoperative nausea and vomiting)    Past Surgical History:  Procedure Laterality Date   KNEE ARTHROSCOPY Left 02/02/2022   Procedure: LEFT ARTHROSCOPY KNEE LYSIS OF ADHESION AND MANIPULATION UNDER ANESTHESIA;  Surgeon: [name], MD;  Location: [place];  Service: Orthopedics;  Laterality: Left;   KNEE ARTHROSCOPY WITH ANTERIOR CRUCIATE LIGAMENT (ACL) REPAIR WITH HAMSTRING GRAFT Left 11/08/2021   Procedure: LEFT KNEE ANTERIOR CRUCIATE LIGAMENT RECONSTRUCTION WITH QUADRICEPS TENDON AUTOGRAFT;  Surgeon: [name], MD;  Location: [place];  Service: Orthopedics;  Laterality: Left;   KNEE ARTHROSCOPY WITH ANTERIOR CRUCIATE LIGAMENT (ACL) REPAIR WITH HAMSTRING GRAFT Left 04/30/2022   Procedure: LEFT KNEE ARTHROSCOPY WITH ANTERIOR CRUCIATE LIGAMENT (ACL) REPAIR WITH QUADRICEPS ALLOGRAFT AND LATERAL MENISCAL REPAIR;  Surgeon: [name], MD;  Location: [place];  Service: Orthopedics;  Laterality: Left;   KNEE ARTHROSCOPY WITH MENISCAL REPAIR  11/08/2021   Procedure: LEFT LATERAL MENISCAL REPAIR;  Surgeon: [name], MD;  Location: [place];  Service: Orthopedics;;   [place] EXTRACTION  2022   Patient Active Problem List   Diagnosis Date Noted   Peripheral tear of lateral meniscus of left knee as current injury    Arthrofibrosis of knee joint, left 01/31/2022   S/P repair of anterior cruciate ligament 01/31/2022   Morbid obesity with BMI of 40.0-44.9, adult ([place]) 01/31/2022   Hypertriglyceridemia 01/31/2022   Angioedema of lips- [place] 12/13/2021   Tear of anterior cruciate ligament graft ([place])     Complex tear of lateral meniscus of left knee as current injury    History of orchitis 02/08/2021     PCP: Libby Maw, MD   REFERRING PROVIDER: [name], MD   REFERRING DIAG:  [phone] (ICD-10-CM) - Rupture of anterior cruciate ligament of left knee, initial encounter  S83.272A (ICD-10-CM) - Complex tear of lateral meniscus of left knee as current injury, initial encounter Z98.890 (ICD-10-CM) - S/P left knee arthroscopy    THERAPY DIAG:  Left knee pain, unspecified chronicity   Stiffness of left knee, not elsewhere classified   Muscle weakness (generalized)   Difficulty in walking, not elsewhere classified    PERTINENT HISTORY: 11/08/2021 Left knee ACL reconstruction with quadriceps autograft and lateral meniscal repair.    02/02/2022: Lt knee lysis of adheresion and manipulation under anesthesia  Allergy to certain adhesives including tegaderm per pt report   Re-rupture of Lt ACL and Lt meniscal tear with subsequent repair on 04/30/2022   ONSET DATE: Injury 10/09/2021 / Surgery 11/08/2021; subsequent Lt ACL and meniscus tear and repair on 05/02/2022     SUBJECTIVE:  ***  PAIN:  Are you having pain?  NPRS scale: 0/10 at rest Pain location: Left knee  Pain orientation: Left PAIN TYPE: Aching Pain description: intermittent  Aggravating factors: use of the kne Relieving factors: rest     PRECAUTIONS: Other: per Dr. Eddie Dibbles ACL reconstruction with meniscal repair   WEIGHT BEARING RESTRICTIONS Yes   PLOF: Independent; Pt was able to perform all of his ADLs/IADLs and functional mobility skills without difficulty or limitations.  Pt was able to perform  work activities.  Pt ambulated without an AD with good stability without difficulty.    PATIENT GOALS improved stability with leaning, to have the ability to run, to be able to bowl.  Return to PLOF.      OBJECTIVE:  *Unless otherwise noted, objective measures collected previously*  LE ROM:  A/PROM  Left 02/03/2022 Left 05/02/2022 Left 05/04/2022 Left 05/07/2022 Left 05/14/2022 Left 07/17/2022  Knee flexion 93 AROM/ 97 AAROM/ 103PROM/ 106 PROM following MET 78 AROM, 81 AAROM with strap 82 AROM, 86 AAROM with strap 90 deg AROM heel slide 100 deg 122 with brace donned  Knee extension -1 AROM/ 0 PROM -3 AROM  4 hyper     (Blank rows = not tested)  LE MMT:  MMT Right  Left  Left 03/08/2022 Left 9/25/22023  Hip flexion  4+/5 5/5 5/5  Hip extension      Hip abduction      Knee flexion  5/5 5/5   Knee extension  4+/5 5/5   Ankle dorsiflexion  5/5    Ankle plantarflexion  5/5    Ankle inversion      Ankle eversion       (Blank rows = not tested)   FUNCTIONAL TESTS:  -Dead lift: Good form and depth with no pain    TODAY'S TREATMENT:  [facility] Adult PT Treatment:                                                DATE: 09/25/2022 Therapeutic Exercise: *** Manual Therapy: *** Neuromuscular re-ed: *** Therapeutic Activity: *** Modalities: *** Self Care: [facility] Adult PT Treatment:                                                DATE: 09/18/2022 Therapeutic Exercise: Stepper L5 x 5 min  Standing calf stretch 2 x 30 sec Standing hamstring stretch 2 x 30 sec, standing quad stretch 2 x 30 sec [place] split squat 2 x 12 Step up on 12 inch step 2 x 15  Neuromuscular re-ed: Rebounder SLS on airex pad forward   Therapeutic Activity: Forward hopping ladder x 4 bil LE Forward hopping SL forward x 4 Attempted single leg hops which he demonstrated increased apprehension performed them in the // to fatigue.     [facility] Adult PT Treatment:                                                DATE: 09/14/2022 Therapeutic Exercise: Standing Lt knee chair quad stretch 2x55mn with UE support [place] squat hops with 10# kettlebell in contralateral hand and UE support on ipsilateral side 3x10 [place]alom hop ladder drill x3 laps 2 in, 1 out ladder drill x3 laps 6-inch box drop squat with  mirror feedback 2x10 Manual Therapy: N/A Neuromuscular re-ed: N/A Therapeutic Activity: N/A Modalities: N/A Self Care: N/A     PATIENT EDUCATION: 08/28/2022 Education details: Reviewed HEP and discussed resuming  Person educated: Patient Education method: Explanation, Demonstration, Tactile cues, Verbal cues Education comprehension: verbalized understanding, returned demonstration, verbal cues required, tactile cues required,  and needs further education   HOME EXERCISE PROGRAM: Access Code: [email or web address] URL: [email or web address] Date: 09/04/2022 Prepared by: Starr Lake  Exercises - Wall Squat  - 1 x daily - 3-4 x weekly - 2-3 sets - 10 reps - Tandem Stance  - 1 x daily - 3-4 x weekly - 3 reps - 30 seconds hold - Single Leg Stance  - 1 x daily - 3-4 x weekly - 3 reps - 30 seconds hold - Side Stepping with Resistance at Feet  - 1 x daily - 7 x weekly - 2 sets - 10 reps - Static Lunge  - 1 x daily - 7 x weekly - 2 sets - 10 reps -mini jumps     ASSESSMENT: CLINICAL IMPRESSION: ***   GOALS: SHORT TERM GOALS:   STG Name Target Date Goal status  1 Pt will be independent and compliant with HEP for improved pain, strength, and ROM.  Baseline: New HEP administered 05/02/2022 05/10/2022: Achieved 06/13/2022 ACHIEVED  2 Pt will demo a good quad set and perform supine SLRs independently with brace for improved quad strength. Baseline: Pain with quad setting/ SLR 05/10/2022: achieved 06/13/2022 ACHIEVED  3 Pt will demo improved improved L knee extension PROM/AROM to 0/3 and flexion PROM to 60 deg for improved mobility and stiffness. Baseline: -3 to 78 degrees AROM 07/17/2022: -4-122 AROM 06/13/2022 ACHIEVED  4 Pt will demo improved improved L knee extension AROM to 0 deg and  flexion AAROM/PROM to 80/90 deg for improved mobility and stiffness. Baseline: -3 to 78 degrees AROM 05/10/2022: 0-90 degrees 06/13/2022 ACHIEVED  5 Pt will demo L knee AROM to be 0 - 120 deg for  improved stiffness and performance of functional mobility.   Baseline: -3 to 78 degrees AROM 07/17/2022: -4-122 AROM 06/13/2022 ACHIEVED  6 Pt will progress Wb'ing with gait per MD orders and protocol without adverse effects.  Baseline: 07/17/2022: Pt WBAT with hinge brace 06/13/2022 ACHIEVED  7 Pt will progress with closed chain exercises per protocol without adverse effects for improved functional strength and stability.  Baseline: 07/17/2022: ACHIEVED 06/13/2022 ACHIEVED  8 Pt will score 0-1 on the lateral step down test on a 4 inch step for improved quad eccentric control.   06/13/2022   MET 07/30/2022  9 Pt will score 0-1 on the lateral step down test on a 6 inch step for improved quad eccentric control and performance of stairs.   06/13/2022 MET 07/30/2022  10 Pt will ambulate with a normalized heel to toe gait without limping without AD Baseline: Pt ambulates with swing-through gait with axillary crutches 06/13/2022 MET 07/30/2022    LONG TERM GOALS:    LTG Name Target Date Goal status  1 Pt will be able to perform his ADLs/IADLs and normal functional mobility skills without significant difficulty or pain.  Baseline: Pt reports some moderate difficulty with getting into bed and crouching at work 08/01/2022   MET 07/30/2022  2 Pt will ambulate extended community distance without an AD without increased pain or difficulty.  Baseline: Unable to ambulate without crutches 08/01/2022 MET 07/30/2022  3 Pt will be able to perform his normal daily transfers without difficulty.  Baseline: moderate difficulty 08/01/2022 MET 07/30/2022  4 Pt will demo good squatting form, symmetrical Wb'ing, and good knee alignment without significant pain for improved functional strength and tolerance with IADLs.  Baseline: Unable to perform post-op 10/05/2022 Partially MET  08/28/2022  5 Pt will be able to perform stairs with a reciprocal gait without the  rail with good control.  Baseline: Unable to perform post-op, uses crutches  10/05/2022 Partially MET  08/28/2022  6 Pt will be able to perform all of his school activities and work activities without significant pain or difficulty.   Baseline: unable 10/05/2022 Partially MET  08/28/2022  7 Pt will demo 5/5 L hip and knee strength for improved tolerance with and performance of functional mobility skills and to assist in returning to PLOF.   Baseline: 10/05/2022 Partially MET  08/28/2022     PLAN: PT FREQUENCY: 2-3x/week   PT DURATION: other: 12 weeks   PLANNED INTERVENTIONS: Therapeutic exercises, Therapeutic activity, Neuro Muscular re-education, Balance training, Gait training, Patient/Family education, Joint mobilization, Stair training, DME instructions, Aquatic Therapy, Electrical stimulation, Cryotherapy, Moist heat, Taping, and Manual therapy   PLAN FOR NEXT SESSION:  HEP, gross hip strengthening, balance training.   Jahleah Mariscal PT, DPT, LAT, ATC  09/25/22  7:46 AM

## 2022-10-02 ENCOUNTER — Encounter: Payer: Self-pay | Admitting: Physical Therapy

## 2022-10-02 ENCOUNTER — Ambulatory Visit: Payer: No Typology Code available for payment source | Admitting: Physical Therapy

## 2022-10-02 DIAGNOSIS — M6281 Muscle weakness (generalized): Secondary | ICD-10-CM

## 2022-10-02 DIAGNOSIS — M25662 Stiffness of left knee, not elsewhere classified: Secondary | ICD-10-CM | POA: Diagnosis not present

## 2022-10-02 NOTE — Therapy (Signed)
OUTPATIENT PHYSICAL THERAPY TREATMENT NOTE   Patient Name: Dustin Parks MRN: [id number] DOB:08/13/2001, 21 y.o., male 30 Date: 10/02/2022  PCP: Haydee Salter, MD REFERRING PROVIDER: Dr Donivan Scull    PT End of Session - 10/02/22 1507     Visit Number 8    Number of Visits 22    Date for PT Re-Evaluation 10/22/22    Authorization Type [facility] [employer]    Authorization Time Period 08/01/2022 - 10/31/2022    Authorization - Visit Number 6    Authorization - Number of Visits 21    PT Start Time 1503    PT Stop Time 1547    PT Time Calculation (min) 44 min    Activity Tolerance Patient tolerated treatment well    Behavior During Therapy [facility] for tasks assessed/performed                      Past Medical History:  Diagnosis Date   Allergy    Asthma    out grown now    PONV (postoperative nausea and vomiting)    Past Surgical History:  Procedure Laterality Date   KNEE ARTHROSCOPY Left 02/02/2022   Procedure: LEFT ARTHROSCOPY KNEE LYSIS OF ADHESION AND MANIPULATION UNDER ANESTHESIA;  Surgeon: [name], MD;  Location: [place];  Service: Orthopedics;  Laterality: Left;   KNEE ARTHROSCOPY WITH ANTERIOR CRUCIATE LIGAMENT (ACL) REPAIR WITH HAMSTRING GRAFT Left 11/08/2021   Procedure: LEFT KNEE ANTERIOR CRUCIATE LIGAMENT RECONSTRUCTION WITH QUADRICEPS TENDON AUTOGRAFT;  Surgeon: [name], MD;  Location: [place];  Service: Orthopedics;  Laterality: Left;   KNEE ARTHROSCOPY WITH ANTERIOR CRUCIATE LIGAMENT (ACL) REPAIR WITH HAMSTRING GRAFT Left 04/30/2022   Procedure: LEFT KNEE ARTHROSCOPY WITH ANTERIOR CRUCIATE LIGAMENT (ACL) REPAIR WITH QUADRICEPS ALLOGRAFT AND LATERAL MENISCAL REPAIR;  Surgeon: [name], MD;  Location: [place];  Service: Orthopedics;  Laterality: Left;   KNEE ARTHROSCOPY WITH MENISCAL REPAIR  11/08/2021   Procedure: LEFT LATERAL MENISCAL REPAIR;  Surgeon: [name], MD;  Location: [place];  Service: Orthopedics;;   [place] EXTRACTION  2022   Patient Active Problem List   Diagnosis Date Noted   Peripheral tear of lateral meniscus of left knee as current injury    Arthrofibrosis of knee joint, left 01/31/2022   S/P repair of anterior cruciate ligament 01/31/2022   Morbid obesity with BMI of 40.0-44.9, adult ([place]) 01/31/2022   Hypertriglyceridemia 01/31/2022   Angioedema of lips- [place] 12/13/2021   Tear of anterior cruciate ligament graft ([place])    Complex tear of lateral meniscus of left knee as current injury    History of orchitis 02/08/2021     PCP: Libby Maw, MD   REFERRING PROVIDER: [name], MD   REFERRING DIAG:  [phone] (ICD-10-CM) - Rupture of anterior cruciate ligament of left knee, initial encounter  S83.272A (ICD-10-CM) - Complex tear of lateral meniscus of left knee as current injury, initial encounter Z98.890 (ICD-10-CM) - S/P left knee arthroscopy    THERAPY DIAG:  Left knee pain, unspecified chronicity   Stiffness of left knee, not elsewhere classified   Muscle weakness (generalized)   Difficulty in walking, not elsewhere classified    PERTINENT HISTORY: 11/08/2021 Left knee ACL reconstruction with quadriceps autograft and lateral meniscal repair.    02/02/2022: Lt knee lysis of adheresion and manipulation under anesthesia  Allergy to certain adhesives including tegaderm per pt report   Re-rupture of Lt ACL and Lt meniscal tear  with subsequent repair on 04/30/2022   ONSET DATE: Injury 10/09/2021 / Surgery 11/08/2021; subsequent Lt ACL and meniscus tear and repair on 05/02/2022     SUBJECTIVE:  " I am dong pretty good."  PAIN:  Are you having pain?  NPRS scale: 0/10 at rest Pain location: Left knee  Pain orientation: Left PAIN TYPE: Aching Pain description: intermittent  Aggravating factors: use of the kne Relieving factors: rest     PRECAUTIONS: Other: per Dr. Eddie Dibbles ACL reconstruction with  meniscal repair   WEIGHT BEARING RESTRICTIONS Yes   PLOF: Independent; Pt was able to perform all of his ADLs/IADLs and functional mobility skills without difficulty or limitations.  Pt was able to perform work activities.  Pt ambulated without an AD with good stability without difficulty.    PATIENT GOALS improved stability with leaning, to have the ability to run, to be able to bowl.  Return to PLOF.      OBJECTIVE:  *Unless otherwise noted, objective measures collected previously*  LE ROM:  A/PROM Left 02/03/2022 Left 05/02/2022 Left 05/04/2022 Left 05/07/2022 Left 05/14/2022 Left 07/17/2022  Knee flexion 93 AROM/ 97 AAROM/ 103PROM/ 106 PROM following MET 78 AROM, 81 AAROM with strap 82 AROM, 86 AAROM with strap 90 deg AROM heel slide 100 deg 122 with brace donned  Knee extension -1 AROM/ 0 PROM -3 AROM  4 hyper     (Blank rows = not tested)  LE MMT:  MMT Right  Left  Left 03/08/2022 Left 9/25/22023  Hip flexion  4+/5 5/5 5/5  Hip extension      Hip abduction      Knee flexion  5/5 5/5   Knee extension  4+/5 5/5   Ankle dorsiflexion  5/5    Ankle plantarflexion  5/5    Ankle inversion      Ankle eversion       (Blank rows = not tested)   FUNCTIONAL TESTS:  -Dead lift: Good form and depth with no pain    TODAY'S TREATMENT:  [facility] Adult PT Treatment:                                                DATE: 09/25/2022 Therapeutic Exercise: Elliptical L 6 x ramp L10  x 6 min  Quad stretch 2 x 30 sec Hamstring stretch 2 x 30  Walking lunges 2 x 10  LAQ  single leg going to fatigue then progressing to con bil/ ecc LLE x 3 sets first set at 25#, 2nd set 20, 3rd set at 15#  Stool scoot 2 x 50 ft LLE only Dead lift 3 x 15 95# with hex bar    [facility] Adult PT Treatment:                                                DATE: 09/18/2022 Therapeutic Exercise: Stepper L5 x 5 min  Standing calf stretch 2 x 30 sec Standing hamstring stretch 2 x 30 sec, standing quad stretch 2 x 30  sec [place] split squat 2 x 12 Step up on 12 inch step 2 x 15  Neuromuscular re-ed: Rebounder SLS on airex pad forward   Therapeutic Activity: Forward hopping ladder x 4 bil LE Forward  hopping SL forward x 4 Attempted single leg hops which he demonstrated increased apprehension performed them in the // to fatigue.     [facility] Adult PT Treatment:                                                DATE: 09/14/2022 Therapeutic Exercise: Standing Lt knee chair quad stretch 2x40mn with UE support [place] squat hops with 10# kettlebell in contralateral hand and UE support on ipsilateral side 3x10 [place] hop ladder drill x3 laps 2 in, 1 out ladder drill x3 laps 6-inch box drop squat with mirror feedback 2x10 Manual Therapy: N/A Neuromuscular re-ed: N/A Therapeutic Activity: N/A Modalities: N/A Self Care: N/A     PATIENT EDUCATION: 08/28/2022 Education details: Reviewed HEP and discussed resuming  Person educated: Patient Education method: Explanation, Demonstration, Tactile cues, Verbal cues Education comprehension: verbalized understanding, returned demonstration, verbal cues required, tactile cues required, and needs further education   HOME EXERCISE PROGRAM: Access Code: [email or web address]: [email or web address] Date: 09/04/2022 Prepared by: KStarr Lake Exercises - Wall Squat  - 1 x daily - 3-4 x weekly - 2-3 sets - 10 reps - Tandem Stance  - 1 x daily - 3-4 x weekly - 3 reps - 30 seconds hold - Single Leg Stance  - 1 x daily - 3-4 x weekly - 3 reps - 30 seconds hold - Side Stepping with Resistance at Feet  - 1 x daily - 7 x weekly - 2 sets - 10 reps - Static Lunge  - 1 x daily - 7 x weekly - 2 sets - 10 reps -mini jumps     ASSESSMENT: CLINICAL IMPRESSION: BLyndallcontinues to report no pain or issues today. Focused session on quad strengthening with increased reps/ sets. He did demonstrate improvement in lunge form but does fatigue quickly.  Continued working on qForensic scientistwhich he did demonstrate compensation as he fatigued. Discussed with pt that we are nearing the end of his authorized visits and the likelihood of getting more would slim.    GOALS: SHORT TERM GOALS:   STG Name Target Date Goal status  1 Pt will be independent and compliant with HEP for improved pain, strength, and ROM.  Baseline: New HEP administered 05/02/2022 05/10/2022: Achieved 06/13/2022 ACHIEVED  2 Pt will demo a good quad set and perform supine SLRs independently with brace for improved quad strength. Baseline: Pain with quad setting/ SLR 05/10/2022: achieved 06/13/2022 ACHIEVED  3 Pt will demo improved improved L knee extension PROM/AROM to 0/3 and flexion PROM to 60 deg for improved mobility and stiffness. Baseline: -3 to 78 degrees AROM 07/17/2022: -4-122 AROM 06/13/2022 ACHIEVED  4 Pt will demo improved improved L knee extension AROM to 0 deg and  flexion AAROM/PROM to 80/90 deg for improved mobility and stiffness. Baseline: -3 to 78 degrees AROM 05/10/2022: 0-90 degrees 06/13/2022 ACHIEVED  5 Pt will demo L knee AROM to be 0 - 120 deg for improved stiffness and performance of functional mobility.   Baseline: -3 to 78 degrees AROM 07/17/2022: -4-122 AROM 06/13/2022 ACHIEVED  6 Pt will progress Wb'ing with gait per MD orders and protocol without adverse effects.  Baseline: 07/17/2022: Pt WBAT with hinge brace 06/13/2022 ACHIEVED  7 Pt will progress with closed chain exercises per protocol without adverse effects for improved functional strength and stability.  Baseline: 07/17/2022:  ACHIEVED 06/13/2022 ACHIEVED  8 Pt will score 0-1 on the lateral step down test on a 4 inch step for improved quad eccentric control.   06/13/2022   MET 07/30/2022  9 Pt will score 0-1 on the lateral step down test on a 6 inch step for improved quad eccentric control and performance of stairs.   06/13/2022 MET 07/30/2022  10 Pt will ambulate with a normalized heel to toe gait without  limping without AD Baseline: Pt ambulates with swing-through gait with axillary crutches 06/13/2022 MET 07/30/2022    LONG TERM GOALS:    LTG Name Target Date Goal status  1 Pt will be able to perform his ADLs/IADLs and normal functional mobility skills without significant difficulty or pain.  Baseline: Pt reports some moderate difficulty with getting into bed and crouching at work 08/01/2022   MET 07/30/2022  2 Pt will ambulate extended community distance without an AD without increased pain or difficulty.  Baseline: Unable to ambulate without crutches 08/01/2022 MET 07/30/2022  3 Pt will be able to perform his normal daily transfers without difficulty.  Baseline: moderate difficulty 08/01/2022 MET 07/30/2022  4 Pt will demo good squatting form, symmetrical Wb'ing, and good knee alignment without significant pain for improved functional strength and tolerance with IADLs.  Baseline: Unable to perform post-op 10/05/2022 Partially MET  08/28/2022  5 Pt will be able to perform stairs with a reciprocal gait without the rail with good control.  Baseline: Unable to perform post-op, uses crutches 10/05/2022 Partially MET  08/28/2022  6 Pt will be able to perform all of his school activities and work activities without significant pain or difficulty.   Baseline: unable 10/05/2022 Partially MET  08/28/2022  7 Pt will demo 5/5 L hip and knee strength for improved tolerance with and performance of functional mobility skills and to assist in returning to PLOF.   Baseline: 10/05/2022 Partially MET  08/28/2022     PLAN: PT FREQUENCY: 2-3x/week   PT DURATION: other: 12 weeks   PLANNED INTERVENTIONS: Therapeutic exercises, Therapeutic activity, Neuro Muscular re-education, Balance training, Gait training, Patient/Family education, Joint mobilization, Stair training, DME instructions, Aquatic Therapy, Electrical stimulation, Cryotherapy, Moist heat, Taping, and Manual therapy   PLAN FOR NEXT SESSION:  HEP, gross  hip strengthening, balance training.   Kabella Cassidy PT, DPT, LAT, ATC  10/02/22  4:02 PM

## 2022-10-09 ENCOUNTER — Ambulatory Visit: Payer: No Typology Code available for payment source

## 2022-10-09 NOTE — Therapy (Signed)
OUTPATIENT PHYSICAL THERAPY TREATMENT NOTE   Patient Name: Dustin Parks MRN: [id number] DOB:04-Aug-2001, 21 y.o., male Today's Date: 10/10/2022  PCP: Haydee Salter, MD REFERRING PROVIDER: Dr Donivan Scull    PT End of Session - 10/10/22 1453     Visit Number 60    Number of Visits 74    Date for PT Re-Evaluation 10/22/22    Authorization Type [facility] [employer]    Authorization Time Period 08/01/2022 - 10/31/2022    Authorization - Visit Number 7    Authorization - Number of Visits 21    PT Start Time 6295    PT Stop Time 1532    PT Time Calculation (min) 39 min    Activity Tolerance Patient tolerated treatment well    Behavior During Therapy [facility] for tasks assessed/performed                       Past Medical History:  Diagnosis Date   Allergy    Asthma    out grown now    PONV (postoperative nausea and vomiting)    Past Surgical History:  Procedure Laterality Date   KNEE ARTHROSCOPY Left 02/02/2022   Procedure: LEFT ARTHROSCOPY KNEE LYSIS OF ADHESION AND MANIPULATION UNDER ANESTHESIA;  Surgeon: [name], MD;  Location: [place];  Service: Orthopedics;  Laterality: Left;   KNEE ARTHROSCOPY WITH ANTERIOR CRUCIATE LIGAMENT (ACL) REPAIR WITH HAMSTRING GRAFT Left 11/08/2021   Procedure: LEFT KNEE ANTERIOR CRUCIATE LIGAMENT RECONSTRUCTION WITH QUADRICEPS TENDON AUTOGRAFT;  Surgeon: [name], MD;  Location: [place];  Service: Orthopedics;  Laterality: Left;   KNEE ARTHROSCOPY WITH ANTERIOR CRUCIATE LIGAMENT (ACL) REPAIR WITH HAMSTRING GRAFT Left 04/30/2022   Procedure: LEFT KNEE ARTHROSCOPY WITH ANTERIOR CRUCIATE LIGAMENT (ACL) REPAIR WITH QUADRICEPS ALLOGRAFT AND LATERAL MENISCAL REPAIR;  Surgeon: [name], MD;  Location: [place];  Service: Orthopedics;  Laterality: Left;   KNEE ARTHROSCOPY WITH MENISCAL REPAIR  11/08/2021   Procedure: LEFT LATERAL MENISCAL REPAIR;  Surgeon: [name], MD;  Location: [place];  Service: Orthopedics;;   [place] EXTRACTION  2022   Patient Active Problem List   Diagnosis Date Noted   Peripheral tear of lateral meniscus of left knee as current injury    Arthrofibrosis of knee joint, left 01/31/2022   S/P repair of anterior cruciate ligament 01/31/2022   Morbid obesity with BMI of 40.0-44.9, adult ([place]) 01/31/2022   Hypertriglyceridemia 01/31/2022   Angioedema of lips- [place] 12/13/2021   Tear of anterior cruciate ligament graft ([place])    Complex tear of lateral meniscus of left knee as current injury    History of orchitis 02/08/2021     PCP: Libby Maw, MD   REFERRING PROVIDER: [name], MD   REFERRING DIAG:  [phone] (ICD-10-CM) - Rupture of anterior cruciate ligament of left knee, initial encounter  S83.272A (ICD-10-CM) - Complex tear of lateral meniscus of left knee as current injury, initial encounter Z98.890 (ICD-10-CM) - S/P left knee arthroscopy    THERAPY DIAG:  Left knee pain, unspecified chronicity   Stiffness of left knee, not elsewhere classified   Muscle weakness (generalized)   Difficulty in walking, not elsewhere classified    PERTINENT HISTORY: 11/08/2021 Left knee ACL reconstruction with quadriceps autograft and lateral meniscal repair.    02/02/2022: Lt knee lysis of adheresion and manipulation under anesthesia  Allergy to certain adhesives including tegaderm per pt report   Re-rupture of Lt ACL and Lt meniscal  tear with subsequent repair on 04/30/2022   ONSET DATE: Injury 10/09/2021 / Surgery 11/08/2021; subsequent Lt ACL and meniscus tear and repair on 05/02/2022     SUBJECTIVE:  Patient noted some sharp pain in the knee with walking and going down the stairs today, but this has subsided. He has some concerns regarding intermittent Rt ankle pain that started 3 days ago of unknown cause. It feels tight when he points his foot down.   PAIN:  Are you having pain?  No   PRECAUTIONS: Other:  per Dr. Eddie Dibbles ACL reconstruction with meniscal repair   WEIGHT BEARING RESTRICTIONS Yes   PLOF: Independent; Pt was able to perform all of his ADLs/IADLs and functional mobility skills without difficulty or limitations.  Pt was able to perform work activities.  Pt ambulated without an AD with good stability without difficulty.    PATIENT GOALS improved stability with leaning, to have the ability to run, to be able to bowl.  Return to PLOF.      OBJECTIVE:  *Unless otherwise noted, objective measures collected previously*  LE ROM:  A/PROM Left 02/03/2022 Left 05/02/2022 Left 05/04/2022 Left 05/07/2022 Left 05/14/2022 Left 07/17/2022  Knee flexion 93 AROM/ 97 AAROM/ 103PROM/ 106 PROM following MET 78 AROM, 81 AAROM with strap 82 AROM, 86 AAROM with strap 90 deg AROM heel slide 100 deg 122 with brace donned  Knee extension -1 AROM/ 0 PROM -3 AROM  4 hyper     (Blank rows = not tested)  LE MMT:  MMT Right  Left  Left 03/08/2022 Left 9/25/22023  Hip flexion  4+/5 5/5 5/5  Hip extension      Hip abduction      Knee flexion  5/5 5/5   Knee extension  4+/5 5/5   Ankle dorsiflexion  5/5    Ankle plantarflexion  5/5    Ankle inversion      Ankle eversion       (Blank rows = not tested)   FUNCTIONAL TESTS:  -Dead lift: Good form and depth with no pain    TODAY'S TREATMENT: [facility] Adult PT Treatment:                                                DATE: 10/10/22 Therapeutic Exercise: Elliptical level 6 x 5 minutes [place] 4 x 15 ft Walking quad stretch 4 x 15 ft  Bosu forward step up knee driver 2 x 10 LLE Bosu lateral step ups 2 x 10; LLE SL leg press on omega 3 x 10 @ 65 lbs  Squats 3 x 10 @ 25 lbs kettlebell  Reverse lunge on slider x 6 each    [facility] Adult PT Treatment:                                                DATE: 09/25/2022 Therapeutic Exercise: Elliptical L 6 x ramp L10  x 6 min  Quad stretch 2 x 30 sec Hamstring stretch 2 x 30  Walking lunges 2 x 10  LAQ   single leg going to fatigue then progressing to con bil/ ecc LLE x 3 sets first set at 25#, 2nd set 20, 3rd set at 15#  Stool scoot 2 x  50 ft LLE only Dead lift 3 x 15 95# with hex bar    [facility] Adult PT Treatment:                                                DATE: 09/18/2022 Therapeutic Exercise: Stepper L5 x 5 min  Standing calf stretch 2 x 30 sec Standing hamstring stretch 2 x 30 sec, standing quad stretch 2 x 30 sec [place] split squat 2 x 12 Step up on 12 inch step 2 x 15  Neuromuscular re-ed: Rebounder SLS on airex pad forward   Therapeutic Activity: Forward hopping ladder x 4 bil LE Forward hopping SL forward x 4 Attempted single leg hops which he demonstrated increased apprehension performed them in the // to fatigue.         PATIENT EDUCATION: 08/28/2022 Education details: appropriate gym activity  Person educated: Patient Education method: Explanation Education comprehension: verbalized understanding  HOME EXERCISE PROGRAM: Access Code: [email or web address] URL: [email or web address] Date: 09/04/2022 Prepared by: Starr Lake  Exercises - Wall Squat  - 1 x daily - 3-4 x weekly - 2-3 sets - 10 reps - Tandem Stance  - 1 x daily - 3-4 x weekly - 3 reps - 30 seconds hold - Single Leg Stance  - 1 x daily - 3-4 x weekly - 3 reps - 30 seconds hold - Side Stepping with Resistance at Feet  - 1 x daily - 7 x weekly - 2 sets - 10 reps - Static Lunge  - 1 x daily - 7 x weekly - 2 sets - 10 reps -mini jumps     ASSESSMENT: CLINICAL IMPRESSION: Session somewhat limited as patient was late for scheduled visit. Focused on progression of CKC strengthening with good tolerance. He demonstrates good stability with forward step ups on bosu, but is challenged in maintaining balance during lateral step ups on bosu. He demonstrates good form with squats without reports of pain. With SL leg press he has tendency to hyperextend the knee requiring cues to correct. He was  encouraged to utilize bosu and leg press at the gym to further progress his strength independently with patient verbalizing understanding.    GOALS: SHORT TERM GOALS:   STG Name Target Date Goal status  1 Pt will be independent and compliant with HEP for improved pain, strength, and ROM.  Baseline: New HEP administered 05/02/2022 05/10/2022: Achieved 06/13/2022 ACHIEVED  2 Pt will demo a good quad set and perform supine SLRs independently with brace for improved quad strength. Baseline: Pain with quad setting/ SLR 05/10/2022: achieved 06/13/2022 ACHIEVED  3 Pt will demo improved improved L knee extension PROM/AROM to 0/3 and flexion PROM to 60 deg for improved mobility and stiffness. Baseline: -3 to 78 degrees AROM 07/17/2022: -4-122 AROM 06/13/2022 ACHIEVED  4 Pt will demo improved improved L knee extension AROM to 0 deg and  flexion AAROM/PROM to 80/90 deg for improved mobility and stiffness. Baseline: -3 to 78 degrees AROM 05/10/2022: 0-90 degrees 06/13/2022 ACHIEVED  5 Pt will demo L knee AROM to be 0 - 120 deg for improved stiffness and performance of functional mobility.   Baseline: -3 to 78 degrees AROM 07/17/2022: -4-122 AROM 06/13/2022 ACHIEVED  6 Pt will progress Wb'ing with gait per MD orders and protocol without adverse effects.  Baseline: 07/17/2022: Pt WBAT with hinge brace 06/13/2022 ACHIEVED  7  Pt will progress with closed chain exercises per protocol without adverse effects for improved functional strength and stability.  Baseline: 07/17/2022: ACHIEVED 06/13/2022 ACHIEVED  8 Pt will score 0-1 on the lateral step down test on a 4 inch step for improved quad eccentric control.   06/13/2022   MET 07/30/2022  9 Pt will score 0-1 on the lateral step down test on a 6 inch step for improved quad eccentric control and performance of stairs.   06/13/2022 MET 07/30/2022  10 Pt will ambulate with a normalized heel to toe gait without limping without AD Baseline: Pt ambulates with swing-through gait with  axillary crutches 06/13/2022 MET 07/30/2022    LONG TERM GOALS:    LTG Name Target Date Goal status  1 Pt will be able to perform his ADLs/IADLs and normal functional mobility skills without significant difficulty or pain.  Baseline: Pt reports some moderate difficulty with getting into bed and crouching at work 08/01/2022   MET 07/30/2022  2 Pt will ambulate extended community distance without an AD without increased pain or difficulty.  Baseline: Unable to ambulate without crutches 08/01/2022 MET 07/30/2022  3 Pt will be able to perform his normal daily transfers without difficulty.  Baseline: moderate difficulty 08/01/2022 MET 07/30/2022  4 Pt will demo good squatting form, symmetrical Wb'ing, and good knee alignment without significant pain for improved functional strength and tolerance with IADLs.  Baseline: Unable to perform post-op 10/05/2022 Partially MET  08/28/2022  5 Pt will be able to perform stairs with a reciprocal gait without the rail with good control.  Baseline: Unable to perform post-op, uses crutches 10/05/2022 Partially MET  08/28/2022  6 Pt will be able to perform all of his school activities and work activities without significant pain or difficulty.   Baseline: unable 10/05/2022 Partially MET  08/28/2022  7 Pt will demo 5/5 L hip and knee strength for improved tolerance with and performance of functional mobility skills and to assist in returning to PLOF.   Baseline: 10/05/2022 Partially MET  08/28/2022     PLAN: PT FREQUENCY: 2-3x/week   PT DURATION: other: 12 weeks   PLANNED INTERVENTIONS: Therapeutic exercises, Therapeutic activity, Neuro Muscular re-education, Balance training, Gait training, Patient/Family education, Joint mobilization, Stair training, DME instructions, Aquatic Therapy, Electrical stimulation, Cryotherapy, Moist heat, Taping, and Manual therapy   PLAN FOR NEXT SESSION:  HEP, gross hip strengthening, balance training.   Gwendolyn Grant, PT, DPT,  ATC 10/10/22 3:40 PM

## 2022-10-10 ENCOUNTER — Ambulatory Visit: Payer: No Typology Code available for payment source | Attending: Family Medicine

## 2022-10-10 DIAGNOSIS — M25562 Pain in left knee: Secondary | ICD-10-CM | POA: Insufficient documentation

## 2022-10-10 DIAGNOSIS — M25662 Stiffness of left knee, not elsewhere classified: Secondary | ICD-10-CM | POA: Insufficient documentation

## 2022-10-10 DIAGNOSIS — R262 Difficulty in walking, not elsewhere classified: Secondary | ICD-10-CM | POA: Insufficient documentation

## 2022-10-10 DIAGNOSIS — M6281 Muscle weakness (generalized): Secondary | ICD-10-CM | POA: Diagnosis present

## 2022-10-16 ENCOUNTER — Ambulatory Visit: Payer: No Typology Code available for payment source | Admitting: Physical Therapy

## 2022-10-16 ENCOUNTER — Encounter: Payer: Self-pay | Admitting: Physical Therapy

## 2022-10-16 DIAGNOSIS — M25662 Stiffness of left knee, not elsewhere classified: Secondary | ICD-10-CM | POA: Diagnosis not present

## 2022-10-16 DIAGNOSIS — M6281 Muscle weakness (generalized): Secondary | ICD-10-CM

## 2022-10-16 NOTE — Therapy (Signed)
OUTPATIENT PHYSICAL THERAPY TREATMENT NOTE   Patient Name: Dustin Parks MRN: [id number] DOB:02-21-2001, 21 y.o., male 65 Date: 10/16/2022  PCP: Haydee Salter, MD REFERRING PROVIDER: Dr Donivan Scull    PT End of Session - 10/16/22 1506     Visit Number 66    Number of Visits 4    Date for PT Re-Evaluation 10/22/22    Authorization Type [facility]    Authorization Time Period 08/01/2022 - 10/31/2022    Authorization - Visit Number 8    Authorization - Number of Visits 21    PT Start Time 8588    PT Stop Time 1548    PT Time Calculation (min) 42 min    Activity Tolerance Patient tolerated treatment well    Behavior During Therapy WFL for tasks assessed/performed                        Past Medical History:  Diagnosis Date   Allergy    Asthma    out grown now    PONV (postoperative nausea and vomiting)    Past Surgical History:  Procedure Laterality Date   KNEE ARTHROSCOPY Left 02/02/2022   Procedure: LEFT ARTHROSCOPY KNEE LYSIS OF ADHESION AND MANIPULATION UNDER ANESTHESIA;  Surgeon: [name], MD;  Location: [place];  Service: Orthopedics;  Laterality: Left;   KNEE ARTHROSCOPY WITH ANTERIOR CRUCIATE LIGAMENT (ACL) REPAIR WITH HAMSTRING GRAFT Left 11/08/2021   Procedure: LEFT KNEE ANTERIOR CRUCIATE LIGAMENT RECONSTRUCTION WITH QUADRICEPS TENDON AUTOGRAFT;  Surgeon: [name], MD;  Location: [place];  Service: Orthopedics;  Laterality: Left;   KNEE ARTHROSCOPY WITH ANTERIOR CRUCIATE LIGAMENT (ACL) REPAIR WITH HAMSTRING GRAFT Left 04/30/2022   Procedure: LEFT KNEE ARTHROSCOPY WITH ANTERIOR CRUCIATE LIGAMENT (ACL) REPAIR WITH QUADRICEPS ALLOGRAFT AND LATERAL MENISCAL REPAIR;  Surgeon: [name], MD;  Location: [place];  Service: Orthopedics;  Laterality: Left;   KNEE ARTHROSCOPY WITH MENISCAL REPAIR  11/08/2021   Procedure: LEFT LATERAL MENISCAL REPAIR;  Surgeon: [name], MD;  Location: [place];  Service: Orthopedics;;   [place] EXTRACTION  2022   Patient Active Problem List   Diagnosis Date Noted   Peripheral tear of lateral meniscus of left knee as current injury    Arthrofibrosis of knee joint, left 01/31/2022   S/P repair of anterior cruciate ligament 01/31/2022   Morbid obesity with BMI of 40.0-44.9, adult ([place]) 01/31/2022   Hypertriglyceridemia 01/31/2022   Angioedema of lips- [place] 12/13/2021   Tear of anterior cruciate ligament graft ([place])    Complex tear of lateral meniscus of left knee as current injury    History of orchitis 02/08/2021     PCP: Libby Maw, MD   REFERRING PROVIDER: [name], MD   REFERRING DIAG:  [phone] (ICD-10-CM) - Rupture of anterior cruciate ligament of left knee, initial encounter  S83.272A (ICD-10-CM) - Complex tear of lateral meniscus of left knee as current injury, initial encounter Z98.890 (ICD-10-CM) - S/P left knee arthroscopy    THERAPY DIAG:  Left knee pain, unspecified chronicity   Stiffness of left knee, not elsewhere classified   Muscle weakness (generalized)   Difficulty in walking, not elsewhere classified    PERTINENT HISTORY: 11/08/2021 Left knee ACL reconstruction with quadriceps autograft and lateral meniscal repair.    02/02/2022: Lt knee lysis of adheresion and manipulation under anesthesia  Allergy to certain adhesives including tegaderm per pt report   Re-rupture of Lt ACL and Lt  meniscal tear with subsequent repair on 04/30/2022   ONSET DATE: Injury 10/09/2021 / Surgery 11/08/2021; subsequent Lt ACL and meniscus tear and repair on 05/02/2022     SUBJECTIVE:  "The ankle pain has improved since the last session.   PAIN:  Are you having pain?  No   PRECAUTIONS: Other: per Dr. Eddie Dibbles ACL reconstruction with meniscal repair   WEIGHT BEARING RESTRICTIONS Yes   PLOF: Independent; Pt was able to perform all of his ADLs/IADLs and functional mobility skills without  difficulty or limitations.  Pt was able to perform work activities.  Pt ambulated without an AD with good stability without difficulty.    PATIENT GOALS improved stability with leaning, to have the ability to run, to be able to bowl.  Return to PLOF.      OBJECTIVE:  *Unless otherwise noted, objective measures collected previously*  LE ROM:  A/PROM Left 02/03/2022 Left 05/02/2022 Left 05/04/2022 Left 05/07/2022 Left 05/14/2022 Left 07/17/2022  Knee flexion 93 AROM/ 97 AAROM/ 103PROM/ 106 PROM following MET 78 AROM, 81 AAROM with strap 82 AROM, 86 AAROM with strap 90 deg AROM heel slide 100 deg 122 with brace donned  Knee extension -1 AROM/ 0 PROM -3 AROM  4 hyper     (Blank rows = not tested)  LE MMT:  MMT Right  Left  Left 03/08/2022 Left 9/25/22023  Hip flexion  4+/5 5/5 5/5  Hip extension      Hip abduction      Knee flexion  5/5 5/5   Knee extension  4+/5 5/5   Ankle dorsiflexion  5/5    Ankle plantarflexion  5/5    Ankle inversion      Ankle eversion       (Blank rows = not tested)   FUNCTIONAL TESTS:  -Dead lift: Good form and depth with no pain    TODAY'S TREATMENT:  [facility] Adult PT Treatment:                                                DATE: 10/16/2022 Therapeutic Exercise: Stepper L 3 x 5 min (pt stopped frequent during stpper exercise requiring verbal cues to keep constant steady pace) Standing quad stretch 2 x 30 sec Forward lunge with lead leg on on bosu alternating L/R 2 x 10 Squats on reverse bosu 1 x 20 Alternating toe taps 1 x 20 sec in place - progressed to toe taps on CW/CCW 2 x 20 sec Therapeutic Activity: Double limb hopping 2 x 30 ft Alternating hope/ landing from LLE to RLE 2 x 10, 2 x 10 with longer hops Single leg hops 2 x 10 LLE only focusing on shorter hops     [facility] Adult PT Treatment:                                                DATE: 10/10/22 Therapeutic Exercise: Elliptical level 6 x 5 minutes Frankensteins 4 x 15 ft Walking quad  stretch 4 x 15 ft  Bosu forward step up knee driver 2 x 10 LLE Bosu lateral step ups 2 x 10; LLE SL leg press on omega 3 x 10 @ 65 lbs  Squats 3 x 10 @ 25 lbs kettlebell  Reverse lunge on slider x 6 each    [facility] Adult PT Treatment:                                                DATE: 09/25/2022 Therapeutic Exercise: Elliptical L 6 x ramp L10  x 6 min  Quad stretch 2 x 30 sec Hamstring stretch 2 x 30  Walking lunges 2 x 10  LAQ  single leg going to fatigue then progressing to con bil/ ecc LLE x 3 sets first set at 25#, 2nd set 20, 3rd set at 15#  Stool scoot 2 x 50 ft LLE only Dead lift 3 x 15 95# with hex bar    PATIENT EDUCATION: 08/28/2022 Education details: appropriate gym activity  Person educated: Patient Education method: Explanation Education comprehension: verbalized understanding  HOME EXERCISE PROGRAM: Access Code: [email or web address] URL: [email or web address] Date: 09/04/2022 Prepared by: Starr Lake  Exercises - Wall Squat  - 1 x daily - 3-4 x weekly - 2-3 sets - 10 reps - Tandem Stance  - 1 x daily - 3-4 x weekly - 3 reps - 30 seconds hold - Single Leg Stance  - 1 x daily - 3-4 x weekly - 3 reps - 30 seconds hold - Side Stepping with Resistance at Feet  - 1 x daily - 7 x weekly - 2 sets - 10 reps - Static Lunge  - 1 x daily - 7 x weekly - 2 sets - 10 reps -mini jumps     ASSESSMENT: CLINICAL IMPRESSION: Patient arrives to session reporting the pain has improved in his ankle since the last session.  Continue working on quad and glutes strengthening utilizing both stable and unstable surfaces.  Progressed plyometric activities with both double limb progressing to alternating single limb pushoff and landing and ultimately to single limb.  Patient demonstrated apprehension with single leg jumping and landing but with redirection of focus was able to perform tasks without complaint.  Plan to see patient back in 2 to 3 weeks without formal PT to assess  response and potential discharge at that time.   GOALS: SHORT TERM GOALS:   STG Name Target Date Goal status  1 Pt will be independent and compliant with HEP for improved pain, strength, and ROM.  Baseline: New HEP administered 05/02/2022 05/10/2022: Achieved 06/13/2022 ACHIEVED  2 Pt will demo a good quad set and perform supine SLRs independently with brace for improved quad strength. Baseline: Pain with quad setting/ SLR 05/10/2022: achieved 06/13/2022 ACHIEVED  3 Pt will demo improved improved L knee extension PROM/AROM to 0/3 and flexion PROM to 60 deg for improved mobility and stiffness. Baseline: -3 to 78 degrees AROM 07/17/2022: -4-122 AROM 06/13/2022 ACHIEVED  4 Pt will demo improved improved L knee extension AROM to 0 deg and  flexion AAROM/PROM to 80/90 deg for improved mobility and stiffness. Baseline: -3 to 78 degrees AROM 05/10/2022: 0-90 degrees 06/13/2022 ACHIEVED  5 Pt will demo L knee AROM to be 0 - 120 deg for improved stiffness and performance of functional mobility.   Baseline: -3 to 78 degrees AROM 07/17/2022: -4-122 AROM 06/13/2022 ACHIEVED  6 Pt will progress Wb'ing with gait per MD orders and protocol without adverse effects.  Baseline: 07/17/2022: Pt WBAT with hinge brace 06/13/2022 ACHIEVED  7 Pt will progress with closed chain exercises per protocol without adverse effects for  improved functional strength and stability.  Baseline: 07/17/2022: ACHIEVED 06/13/2022 ACHIEVED  8 Pt will score 0-1 on the lateral step down test on a 4 inch step for improved quad eccentric control.   06/13/2022   MET 07/30/2022  9 Pt will score 0-1 on the lateral step down test on a 6 inch step for improved quad eccentric control and performance of stairs.   06/13/2022 MET 07/30/2022  10 Pt will ambulate with a normalized heel to toe gait without limping without AD Baseline: Pt ambulates with swing-through gait with axillary crutches 06/13/2022 MET 07/30/2022    LONG TERM GOALS:    LTG Name Target Date Goal  status  1 Pt will be able to perform his ADLs/IADLs and normal functional mobility skills without significant difficulty or pain.  Baseline: Pt reports some moderate difficulty with getting into bed and crouching at work 08/01/2022   MET 07/30/2022  2 Pt will ambulate extended community distance without an AD without increased pain or difficulty.  Baseline: Unable to ambulate without crutches 08/01/2022 MET 07/30/2022  3 Pt will be able to perform his normal daily transfers without difficulty.  Baseline: moderate difficulty 08/01/2022 MET 07/30/2022  4 Pt will demo good squatting form, symmetrical Wb'ing, and good knee alignment without significant pain for improved functional strength and tolerance with IADLs.  Baseline: Unable to perform post-op 10/05/2022 Partially MET  08/28/2022  5 Pt will be able to perform stairs with a reciprocal gait without the rail with good control.  Baseline: Unable to perform post-op, uses crutches 10/05/2022 Partially MET  08/28/2022  6 Pt will be able to perform all of his school activities and work activities without significant pain or difficulty.   Baseline: unable 10/05/2022 Partially MET  08/28/2022  7 Pt will demo 5/5 L hip and knee strength for improved tolerance with and performance of functional mobility skills and to assist in returning to PLOF.   Baseline: 10/05/2022 Partially MET  08/28/2022     PLAN: PT FREQUENCY: 2-3x/week   PT DURATION: other: 12 weeks   PLANNED INTERVENTIONS: Therapeutic exercises, Therapeutic activity, Neuro Muscular re-education, Balance training, Gait training, Patient/Family education, Joint mobilization, Stair training, DME instructions, Aquatic Therapy, Electrical stimulation, Cryotherapy, Moist heat, Taping, and Manual therapy   PLAN FOR NEXT SESSION:  HEP, gross hip strengthening, balance training.   Yianni Skilling PT, DPT, LAT, ATC  10/16/22  4:02 PM

## 2022-10-22 ENCOUNTER — Ambulatory Visit (INDEPENDENT_AMBULATORY_CARE_PROVIDER_SITE_OTHER): Payer: No Typology Code available for payment source | Admitting: Orthopaedic Surgery

## 2022-10-22 DIAGNOSIS — T8489XA Other specified complication of internal orthopedic prosthetic devices, implants and grafts, initial encounter: Secondary | ICD-10-CM

## 2022-10-22 NOTE — Progress Notes (Signed)
Post Operative Evaluation    Procedure/Date of Surgery: Left knee ACL reconstruction with quadriceps tendon allograft and lateral meniscus repair 04/30/22  Interval History:   10/22/2022: Presents today for 47-month follow-up overall doing extremely well.  Denies any recurrences of instability.  He will occasionally have soreness although this is very mild.  He is back to doing most activities of daily living without noticing the knee.   PMH/PSH/Family History/Social History/Meds/Allergies:    Past Medical History:  Diagnosis Date   Allergy    Asthma    out grown now    PONV (postoperative nausea and vomiting)    Past Surgical History:  Procedure Laterality Date   KNEE ARTHROSCOPY Left 02/02/2022   Procedure: LEFT ARTHROSCOPY KNEE LYSIS OF ADHESION AND MANIPULATION UNDER ANESTHESIA;  Surgeon: Huel Cote, MD;  Location: [facility];  Service: Orthopedics;  Laterality: Left;   KNEE ARTHROSCOPY WITH ANTERIOR CRUCIATE LIGAMENT (ACL) REPAIR WITH HAMSTRING GRAFT Left 11/08/2021   Procedure: LEFT KNEE ANTERIOR CRUCIATE LIGAMENT RECONSTRUCTION WITH QUADRICEPS TENDON AUTOGRAFT;  Surgeon: Huel Cote, MD;  Location: [facility];  Service: Orthopedics;  Laterality: Left;   KNEE ARTHROSCOPY WITH ANTERIOR CRUCIATE LIGAMENT (ACL) REPAIR WITH HAMSTRING GRAFT Left 04/30/2022   Procedure: LEFT KNEE ARTHROSCOPY WITH ANTERIOR CRUCIATE LIGAMENT (ACL) REPAIR WITH QUADRICEPS ALLOGRAFT AND LATERAL MENISCAL REPAIR;  Surgeon: Huel Cote, MD;  [facility];  Service: Orthopedics;  Laterality: Left;   KNEE ARTHROSCOPY WITH MENISCAL REPAIR  11/08/2021   Procedure: LEFT LATERAL MENISCAL REPAIR;  Surgeon: Huel Cote, MD;  Location: [facility];  Service: Orthopedics;;   WISDOM TOOTH EXTRACTION  2022   Social History   Socioeconomic History   Marital status: Single    Spouse name: Not on file   Number of children: 1    Years of education: Not on file   Highest education level: Not on file  Occupational History   Occupation: Student    Comment: [employer]  Tobacco Use   Smoking status: Never   Smokeless tobacco: Never  Vaping Use   Vaping Use: Never used  Substance and Sexual Activity   Alcohol use: Not Currently    Comment: rare   Drug use: Yes    Types: Marijuana    Comment: Delta 8 THC, 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   Sexual activity: Yes  Other Topics Concern   Not on file  Social History Narrative   Not on file   Social Determinants of Health   Financial Resource Strain: Not on file  Food Insecurity: Not on file  Transportation Needs: Not on file  Physical Activity: Not on file  Stress: Not on file  Social Connections: Not on file   Family History  Problem Relation Age of Onset   Obesity Mother    Hyperlipidemia Father    Obesity Father    Obesity Maternal Grandmother    Stroke Maternal Grandfather    Diabetes Paternal Grandmother    Allergies  Allergen Reactions   Other Swelling    All citrus fruit with the exception of lime   Current Outpatient Medications  Medication Sig Dispense Refill   acetaminophen (TYLENOL) 500 MG tablet Take 500 mg by mouth every 6 (six) hours as needed.     aspirin EC 325 MG tablet Take 1 tablet (325  mg total) by mouth daily. 30 tablet 0   EPINEPHrine 0.3 mg/0.3 mL IJ SOAJ injection Inject 0.3 mg into the muscle as needed for anaphylaxis. 2 each 1   ibuprofen (ADVIL) 800 MG tablet Take 800 mg by mouth every 8 (eight) hours as needed.     No current facility-administered medications for this visit.   No results found.  Review of Systems:   A ROS was performed including pertinent positives and negatives as documented in the HPI.   Musculoskeletal Exam:    There were no vitals taken for this visit.  Left knee incisions are well-healed.  No erythema or drainage.  Lachman is negative.  No joint line tenderness.  Range of motion is from -3 to 130   degrees without pain.  Improved quad tone and atrophy.  No joint line tenderness. Imaging:    None  I personally reviewed and interpreted the radiographs.   Assessment:   21 year old male who is 6 months status post left knee ACL reconstruction with revision meniscal repair overall doing extremely well.  At this time I would like him to avoid any type of cutting or pivoting type activity.  I will plan to see him back in 3 months for reassessment.  All limitations and restrictions were specifically discussed.  Plan :    -Return to clinic in 12 weeks      I personally saw and evaluated the patient, and participated in the management and treatment plan.  Huel Cote, MD Attending Physician, Orthopedic Surgery  This document was dictated using Dragon voice recognition software. A reasonable attempt at proof reading has been made to minimize errors.

## 2022-11-13 ENCOUNTER — Encounter: Payer: Self-pay | Admitting: Physical Therapy

## 2022-11-13 ENCOUNTER — Ambulatory Visit: Payer: 59 | Attending: Family Medicine | Admitting: Physical Therapy

## 2022-11-13 DIAGNOSIS — R262 Difficulty in walking, not elsewhere classified: Secondary | ICD-10-CM | POA: Diagnosis not present

## 2022-11-13 DIAGNOSIS — M25662 Stiffness of left knee, not elsewhere classified: Secondary | ICD-10-CM | POA: Diagnosis not present

## 2022-11-13 DIAGNOSIS — M6281 Muscle weakness (generalized): Secondary | ICD-10-CM | POA: Diagnosis not present

## 2022-11-13 NOTE — Therapy (Signed)
OUTPATIENT PHYSICAL THERAPY TREATMENT NOTE / DISCHARGE  Patient Name: Dustin Parks MRN: [id number] DOB:09/02/2001, 22 y.o., male Today's Date: 11/13/2022  PCP: Loyola Mast, MD REFERRING PROVIDER: Dr Maricela Bo    PT End of Session - 11/13/22 1420     Visit Number 62    Number of Visits 69    Date for PT Re-Evaluation 11/13/22    Authorization Type [facility]    PT Start Time 1420    PT Stop Time 1502    PT Time Calculation (min) 42 min    Activity Tolerance Patient tolerated treatment well                         Past Medical History:  Diagnosis Date   Allergy    Asthma    out grown now    PONV (postoperative nausea and vomiting)    Past Surgical History:  Procedure Laterality Date   KNEE ARTHROSCOPY Left 02/02/2022   Procedure: LEFT ARTHROSCOPY KNEE LYSIS OF ADHESION AND MANIPULATION UNDER ANESTHESIA;  Surgeon: Huel Cote, MD;  Location: [facility];  Service: Orthopedics;  Laterality: Left;   KNEE ARTHROSCOPY WITH ANTERIOR CRUCIATE LIGAMENT (ACL) REPAIR WITH HAMSTRING GRAFT Left 11/08/2021   Procedure: LEFT KNEE ANTERIOR CRUCIATE LIGAMENT RECONSTRUCTION WITH QUADRICEPS TENDON AUTOGRAFT;  Surgeon: Huel Cote, MD;  Location: [facility];  Service: Orthopedics;  Laterality: Left;   KNEE ARTHROSCOPY WITH ANTERIOR CRUCIATE LIGAMENT (ACL) REPAIR WITH HAMSTRING GRAFT Left 04/30/2022   Procedure: LEFT KNEE ARTHROSCOPY WITH ANTERIOR CRUCIATE LIGAMENT (ACL) REPAIR WITH QUADRICEPS ALLOGRAFT AND LATERAL MENISCAL REPAIR;  Surgeon: Huel Cote, MD;  Location: [facility];  Service: Orthopedics;  Laterality: Left;   KNEE ARTHROSCOPY WITH MENISCAL REPAIR  11/08/2021   Procedure: LEFT LATERAL MENISCAL REPAIR;  Surgeon: Huel Cote, MD;  Location: [facility];  Service: Orthopedics;;   WISDOM TOOTH EXTRACTION  2022   Patient Active Problem List   Diagnosis Date Noted   Peripheral tear of lateral meniscus of left  knee as current injury    Arthrofibrosis of knee joint, left 01/31/2022   S/P repair of anterior cruciate ligament 01/31/2022   Morbid obesity with BMI of 40.0-44.9, adult (HCC) 01/31/2022   Hypertriglyceridemia 01/31/2022   Angioedema of lips- [place] 12/13/2021   Tear of anterior cruciate ligament graft (HCC)    Complex tear of lateral meniscus of left knee as current injury    History of orchitis 02/08/2021     PCP: Mliss Sax, MD   REFERRING PROVIDER: Huel Cote, MD   REFERRING DIAG:  [phone] (ICD-10-CM) - Rupture of anterior cruciate ligament of left knee, initial encounter  S83.272A (ICD-10-CM) - Complex tear of lateral meniscus of left knee as current injury, initial encounter Z98.890 (ICD-10-CM) - S/P left knee arthroscopy    THERAPY DIAG:  Left knee pain, unspecified chronicity   Stiffness of left knee, not elsewhere classified   Muscle weakness (generalized)   Difficulty in walking, not elsewhere classified    PERTINENT HISTORY: 11/08/2021 Left knee ACL reconstruction with quadriceps autograft and lateral meniscal repair.    02/02/2022: Lt knee lysis of adheresion and manipulation under anesthesia  Allergy to certain adhesives including tegaderm per pt report   Re-rupture of Lt ACL and Lt meniscal tear with subsequent repair on 04/30/2022   ONSET DATE: Injury 10/09/2021 / Surgery 11/08/2021; subsequent Lt ACL and meniscus tear and repair on 05/02/2022     SUBJECTIVE:  " I  was walking down the steps at work and caught my toe on my L and ended stepping really hard on my L left, I was sore for a few days, then returned to gym and then went bowling and felt pretty sore after that. Since then it seems a little bit better." He reports feeling unstable after bowling and gave away 5 or more times. He denies any swelling or popping.   PAIN:  Are you having pain?  No   PRECAUTIONS: Other: per Dr. Serena Croissant ACL reconstruction with meniscal repair   WEIGHT  BEARING RESTRICTIONS Yes   PLOF: Independent; Pt was able to perform all of his ADLs/IADLs and functional mobility skills without difficulty or limitations.  Pt was able to perform work activities.  Pt ambulated without an AD with good stability without difficulty.    PATIENT GOALS improved stability with leaning, to have the ability to run, to be able to bowl.  Return to PLOF.      OBJECTIVE:  *Unless otherwise noted, objective measures collected previously*  LE ROM:  A/PROM Left 02/03/2022 Left 05/02/2022 Left 05/04/2022 Left 05/07/2022 Left 05/14/2022 Left 07/17/2022  Knee flexion 93 AROM/ 97 AAROM/ 103PROM/ 106 PROM following MET 78 AROM, 81 AAROM with strap 82 AROM, 86 AAROM with strap 90 deg AROM heel slide 100 deg 122 with brace donned  Knee extension -1 AROM/ 0 PROM -3 AROM  4 hyper     (Blank rows = not tested)  LE MMT:  MMT Right  Left  Left 03/08/2022 Left 9/25/22023 Left 11/13/2022  Hip flexion  4+/5 5/5 5/5   Hip extension       Hip abduction     4+/5  Knee flexion  5/5 5/5  5/5  Knee extension  4+/5 5/5  5/5  Ankle dorsiflexion  5/5     Ankle plantarflexion  5/5     Ankle inversion       Ankle eversion        (Blank rows = not tested)   FUNCTIONAL TESTS:  -Dead lift: Good form and depth with no pain    TODAY'S TREATMENT: [facility] Adult PT Treatment:                                                DATE: 11/13/2022 Therapeutic Exercise: Extensively reviewed HEP and updated today Reviewed lifting mechanics and to listen to his body.    [facility] Adult PT Treatment:                                                DATE: 10/16/2022 Therapeutic Exercise: Stepper L 3 x 5 min (pt stopped frequent during stpper exercise requiring verbal cues to keep constant steady pace) Standing quad stretch 2 x 30 sec Forward lunge with lead leg on on bosu alternating L/R 2 x 10 Squats on reverse bosu 1 x 20 Alternating toe taps 1 x 20 sec in place - progressed to toe taps on CW/CCW 2 x 20  sec Therapeutic Activity: Double limb hopping 2 x 30 ft Alternating hope/ landing from LLE to RLE 2 x 10, 2 x 10 with longer hops Single leg hops 2 x 10 LLE only focusing on shorter hops  [facility] Adult PT Treatment:                                                DATE: 10/10/22 Therapeutic Exercise: Elliptical level 6 x 5 minutes Frankensteins 4 x 15 ft Walking quad stretch 4 x 15 ft  Bosu forward step up knee driver 2 x 10 LLE Bosu lateral step ups 2 x 10; LLE SL leg press on omega 3 x 10 @ 65 lbs  Squats 3 x 10 @ 25 lbs kettlebell  Reverse lunge on slider x 6 each     PATIENT EDUCATION: 08/28/2022 Education details: appropriate gym activity  Person educated: Patient Education method: Explanation Education comprehension: verbalized understanding   HOME EXERCISE PROGRAM: Access Code: [email or web address] URL: [email or web address] Date: 11/13/2022 Prepared by: Starr Lake  Exercises - Wall Squat  - 1 x daily - 3-4 x weekly - 2-3 sets - 10 reps - Tandem Stance  - 1 x daily - 3-4 x weekly - 3 reps - 30 seconds hold - Single Leg Stance  - 1 x daily - 3-4 x weekly - 3 reps - 30 seconds hold - Side Stepping with Resistance at Feet  - 1 x daily - 7 x weekly - 2 sets - 10 reps - Static Lunge  - 1 x daily - 7 x weekly - 2 sets - 10 reps - Goblet Squat with Kettlebell  - 1 x daily - 7 x weekly - 4 sets - 10 reps - Deadlift With Dumbbells  - 1 x daily - 7 x weekly - 2 sets - 10 reps - Knee Extension with Weight Machine  - 1 x daily - 7 x weekly - 2 sets - 10 reps - Hamstring Curl with TRX  - 1 x daily - 7 x weekly - 3 sets - 10 reps - Assisted Lunge with TRX  - 1 x daily - 7 x weekly - 2 sets - 10 reps - Squat with TRX  - 1 x daily - 7 x weekly - 2 sets - 10 reps    ASSESSMENT: CLINICAL IMPRESSION: Pt arrives to PT reporting he had an incident where he caught his L foot while going down the steps at work resulting in him slamming his foot down hard and he noted some  soreness in the L knee that increased with bowling. He reports the pain was in the front of the knee but is improving. Further special testing indicated laxity of the lachmans/ anterior drawer compared bil, however his PSHx may play a role with laxity. Discussed with pt to monitor and if worsens then to follow up with Dr. Sammuel Hines. He has made good progress with strength and has met all goals today. Time was taken to review his HEP. At this point he is able to maintain and progress current LOF IND and will be formally discharged from PT today.    GOALS: SHORT TERM GOALS:   STG Name Target Date Goal status  1 Pt will be independent and compliant with HEP for improved pain, strength, and ROM.  Baseline: New HEP administered 05/02/2022 05/10/2022: Achieved 06/13/2022 ACHIEVED  2 Pt will demo a good quad set and perform supine SLRs independently with brace for improved quad strength. Baseline: Pain with quad setting/ SLR 05/10/2022: achieved 06/13/2022 ACHIEVED  3 Pt will demo improved improved L knee extension PROM/AROM  to 0/3 and flexion PROM to 60 deg for improved mobility and stiffness. Baseline: -3 to 78 degrees AROM 07/17/2022: -4-122 AROM 06/13/2022 ACHIEVED  4 Pt will demo improved improved L knee extension AROM to 0 deg and  flexion AAROM/PROM to 80/90 deg for improved mobility and stiffness. Baseline: -3 to 78 degrees AROM 05/10/2022: 0-90 degrees 06/13/2022 ACHIEVED  5 Pt will demo L knee AROM to be 0 - 120 deg for improved stiffness and performance of functional mobility.   Baseline: -3 to 78 degrees AROM 07/17/2022: -4-122 AROM 06/13/2022 ACHIEVED  6 Pt will progress Wb'ing with gait per MD orders and protocol without adverse effects.  Baseline: 07/17/2022: Pt WBAT with hinge brace 06/13/2022 ACHIEVED  7 Pt will progress with closed chain exercises per protocol without adverse effects for improved functional strength and stability.  Baseline: 07/17/2022: ACHIEVED 06/13/2022 ACHIEVED  8 Pt will score 0-1  on the lateral step down test on a 4 inch step for improved quad eccentric control.   06/13/2022   MET 07/30/2022  9 Pt will score 0-1 on the lateral step down test on a 6 inch step for improved quad eccentric control and performance of stairs.   06/13/2022 MET 07/30/2022  10 Pt will ambulate with a normalized heel to toe gait without limping without AD Baseline: Pt ambulates with swing-through gait with axillary crutches 06/13/2022 MET 07/30/2022    LONG TERM GOALS:    LTG Name Target Date Goal status  1 Pt will be able to perform his ADLs/IADLs and normal functional mobility skills without significant difficulty or pain.  Baseline: Pt reports some moderate difficulty with getting into bed and crouching at work 08/01/2022   MET 07/30/2022  2 Pt will ambulate extended community distance without an AD without increased pain or difficulty.  Baseline: Unable to ambulate without crutches 08/01/2022 MET 07/30/2022  3 Pt will be able to perform his normal daily transfers without difficulty.  Baseline: moderate difficulty 08/01/2022 MET 07/30/2022  4 Pt will demo good squatting form, symmetrical Wb'ing, and good knee alignment without significant pain for improved functional strength and tolerance with IADLs.  Baseline: Unable to perform post-op 10/05/2022 MET 11/13/2022  5 Pt will be able to perform stairs with a reciprocal gait without the rail with good control.  Baseline: Unable to perform post-op, uses crutches 10/05/2022 MET 11/13/2022  6 Pt will be able to perform all of his school activities and work activities without significant pain or difficulty.   Baseline: unable 10/05/2022 MET 11/13/2022  7 Pt will demo 5/5 L hip and knee strength for improved tolerance with and performance of functional mobility skills and to assist in returning to PLOF.   Baseline: 10/05/2022 Partially MET  11/13/2022     PLAN: PT FREQUENCY: 2-3x/week   PT DURATION: other: 12 weeks   PLANNED INTERVENTIONS: Therapeutic exercises,  Therapeutic activity, Neuro Muscular re-education, Balance training, Gait training, Patient/Family education, Joint mobilization, Stair training, DME instructions, Aquatic Therapy, Electrical stimulation, Cryotherapy, Moist heat, Taping, and Manual therapy   PLAN FOR NEXT SESSION:  HEP, gross hip strengthening, balance training.   Wylie Coon PT, DPT, LAT, ATC  11/13/22  3:21 PM      PHYSICAL THERAPY DISCHARGE SUMMARY  Visits from Start of Care: 62  Current functional level related to goals / functional outcomes: See goals   Remaining deficits: See assessment   Education / Equipment: HEP, theraband,; posture lifting mechanics.    Patient agrees to discharge. Patient goals were met. Patient is  being discharged due to meeting the stated rehab goals.

## 2022-11-29 ENCOUNTER — Encounter (HOSPITAL_BASED_OUTPATIENT_CLINIC_OR_DEPARTMENT_OTHER): Payer: Self-pay | Admitting: Orthopaedic Surgery

## 2022-11-29 ENCOUNTER — Other Ambulatory Visit (HOSPITAL_BASED_OUTPATIENT_CLINIC_OR_DEPARTMENT_OTHER): Payer: Self-pay | Admitting: Orthopaedic Surgery

## 2022-11-29 ENCOUNTER — Other Ambulatory Visit (HOSPITAL_BASED_OUTPATIENT_CLINIC_OR_DEPARTMENT_OTHER): Payer: Self-pay

## 2022-11-29 MED ORDER — MELOXICAM 15 MG PO TABS
15.0000 mg | ORAL_TABLET | Freq: Every day | ORAL | 0 refills | Status: AC
  Filled 2022-11-29: qty 14, 14d supply, fill #0

## 2022-11-29 MED ORDER — MELOXICAM 15 MG PO TABS: 15.0000 mg | ORAL_TABLET | Freq: Every day | ORAL | 0 refills | Status: AC

## 2022-12-07 ENCOUNTER — Other Ambulatory Visit (HOSPITAL_BASED_OUTPATIENT_CLINIC_OR_DEPARTMENT_OTHER): Payer: Self-pay

## 2023-01-23 ENCOUNTER — Ambulatory Visit (HOSPITAL_BASED_OUTPATIENT_CLINIC_OR_DEPARTMENT_OTHER): Payer: 59 | Admitting: Orthopaedic Surgery

## 2023-02-08 ENCOUNTER — Encounter: Payer: 59 | Admitting: Family Medicine

## 2024-02-20 ENCOUNTER — Telehealth: Admitting: Family Medicine

## 2024-02-20 ENCOUNTER — Encounter: Payer: Self-pay | Admitting: Family Medicine

## 2024-02-20 DIAGNOSIS — K529 Noninfective gastroenteritis and colitis, unspecified: Secondary | ICD-10-CM | POA: Diagnosis not present

## 2024-02-20 NOTE — Progress Notes (Signed)
 Thank Dannette Dutch thank you  Established Patient Office Visit   Subjective:  Patient ID: Dustin Parks, male    DOB: 2001/09/01  Age: 23 y.o. MRN: [id number]  Chief Complaint  Patient presents with   Abdominal Pain    Pt C/O of abdominal pain, diarrhea and nausea; symptoms began last night with no vomiting present. Pt stated it may be something he ate for dinner with discomfort while making a BM.   HM due- Men B vaccine     Abdominal Pain Associated symptoms include diarrhea and nausea. Pertinent negatives include no constipation, melena, myalgias, rash or vomiting.   Encounter Diagnoses  Name Primary?   Gastroenteritis Yes   1 day history of nausea with lower abdominal pain and watery diarrhea.  Patient denies vomiting, fever chills, or pus in his stool.  He is holding liquids down.  Ginger ale seem to help.  His 27-year-old son is home with him and is well.   Review of Systems  Constitutional: Negative.   HENT: Negative.    Eyes:  Negative for blurred vision, discharge and redness.  Respiratory: Negative.    Cardiovascular: Negative.   Gastrointestinal:  Positive for abdominal pain, diarrhea and nausea. Negative for blood in stool, constipation, melena and vomiting.  Genitourinary: Negative.   Musculoskeletal: Negative.  Negative for myalgias.  Skin:  Negative for rash.  Neurological:  Negative for tingling, loss of consciousness and weakness.  Endo/Heme/Allergies:  Negative for polydipsia.     Current Outpatient Medications:    acetaminophen (TYLENOL) 500 MG tablet, Take 500 mg by mouth every 6 (six) hours as needed. (Patient not taking: Reported on 02/20/2024), Disp: , Rfl:    aspirin EC 325 MG tablet, Take 1 tablet (325 mg total) by mouth daily. (Patient not taking: Reported on 02/20/2024), Disp: 30 tablet, Rfl: 0   EPINEPHrine 0.3 mg/0.3 mL IJ SOAJ injection, Inject 0.3 mg into the muscle as needed for anaphylaxis. (Patient not taking: Reported on 02/20/2024),  Disp: 2 each, Rfl: 1   ibuprofen (ADVIL) 800 MG tablet, Take 800 mg by mouth every 8 (eight) hours as needed. (Patient not taking: Reported on 02/20/2024), Disp: , Rfl:    meloxicam (MOBIC) 15 MG tablet, Take 1 tablet (15 mg total) by mouth daily. (Patient not taking: Reported on 02/20/2024), Disp: 14 tablet, Rfl: 0   meloxicam (MOBIC) 15 MG tablet, Take 1 tablet (15 mg total) by mouth daily. (Patient not taking: Reported on 02/20/2024), Disp: 14 tablet, Rfl: 0   Objective:     There were no vitals taken for this visit.   Physical Exam Constitutional:      General: He is not in acute distress.    Appearance: Normal appearance. He is not ill-appearing, toxic-appearing or diaphoretic.  HENT:     Head: Normocephalic and atraumatic.     Right Ear: External ear normal.     Left Ear: External ear normal.  Eyes:     General: No scleral icterus.       Right eye: No discharge.        Left eye: No discharge.     Extraocular Movements: Extraocular movements intact.     Conjunctiva/sclera: Conjunctivae normal.  Pulmonary:     Effort: Pulmonary effort is normal. No respiratory distress.  Skin:    General: Skin is warm and dry.  Neurological:     Mental Status: He is alert and oriented to person, place, and time.  Psychiatric:        Mood  and Affect: Mood normal.        Behavior: Behavior normal.      No results found for any visits on 02/20/24.    The ASCVD Risk score (Arnett DK, et al., 2019) failed to calculate for the following reasons:   The 2019 ASCVD risk score is only valid for ages 25 to 41    Assessment & Plan:   Gastroenteritis  Encouraged hydration with noncaffeinated fluids such as ginger ale.  Slowly advance diet with bland foods and avoiding greasy or spicy foods return if not doing better in the next 3 to 5 days.  Out of work through tomorrow  Return if symptoms worsen or fail to improve.    Tonna Frederic, MD  Virtual Visit via Video Note  I connected  with Dustin Parks on 02/20/24 at  3:40 PM EDT by a video enabled telemedicine application and verified that I am speaking with the correct person using two identifiers.  Location: Patient: Home with his 2 y/o son.  Provider: work   I discussed the limitations of evaluation and management by telemedicine and the availability of in person appointments. The patient expressed understanding and agreed to proceed.  History of Present Illness:    Observations/Objective:   Assessment and Plan:   Follow Up Instructions:    I discussed the assessment and treatment plan with the patient. The patient was provided an opportunity to ask questions and all were answered. The patient agreed with the plan and demonstrated an understanding of the instructions.   The patient was advised to call back or seek an in-person evaluation if the symptoms worsen or if the condition fails to improve as anticipated.  I provided 20 minutes of non-face-to-face time during this encounter.   Tonna Frederic, MD

## 2024-11-24 ENCOUNTER — Other Ambulatory Visit (HOSPITAL_BASED_OUTPATIENT_CLINIC_OR_DEPARTMENT_OTHER): Payer: Self-pay

## 2024-11-24 ENCOUNTER — Telehealth: Payer: Self-pay

## 2024-11-24 DIAGNOSIS — Z20828 Contact with and (suspected) exposure to other viral communicable diseases: Secondary | ICD-10-CM

## 2024-11-24 MED ORDER — OSELTAMIVIR PHOSPHATE 75 MG PO CAPS
75.0000 mg | ORAL_CAPSULE | Freq: Two times a day (BID) | ORAL | 0 refills | Status: DC
  Filled 2024-11-24: qty 10, 5d supply, fill #0

## 2024-11-24 MED ORDER — OSELTAMIVIR PHOSPHATE 75 MG PO CAPS: 75.0000 mg | ORAL_CAPSULE | Freq: Two times a day (BID) | ORAL | 0 refills | Status: AC

## 2024-11-24 NOTE — Addendum Note (Signed)
 Addended by: THEDORA GARNETTE HERO on: 11/24/2024 11:59 AM   Modules accepted: Orders

## 2024-11-24 NOTE — Addendum Note (Signed)
 Addended by: KYM KARNA CROME on: 11/24/2024 01:59 PM   Modules accepted: Orders

## 2024-11-24 NOTE — Telephone Encounter (Signed)
 Copied from CRM #[id number]. Topic: Clinical - Medical Advice >> Nov 24, 2024  9:33 AM Porter L wrote: Reason for CRM: patient stated his child has influenza A and is looking to get tamaflu in case he gets sick as well, said no current symptoms

## 2024-11-24 NOTE — Telephone Encounter (Signed)
Please review and advise. Thanks. Dm/cma
